# Patient Record
Sex: Male | Born: 1975
Health system: Southern US, Community
[De-identification: ages and names within clinical notes are randomized; demographics above are authoritative.]

## PROBLEM LIST (undated history)

## (undated) DIAGNOSIS — I10 Essential (primary) hypertension: Secondary | ICD-10-CM

## (undated) DIAGNOSIS — E119 Type 2 diabetes mellitus without complications: Secondary | ICD-10-CM

---

## 2001-06-01 ENCOUNTER — Encounter: Payer: Self-pay | Admitting: Emergency Medicine

## 2001-06-01 ENCOUNTER — Emergency Department (HOSPITAL_COMMUNITY): Admission: EM | Admit: 2001-06-01 | Discharge: 2001-06-01 | Payer: Self-pay | Admitting: *Deleted

## 2005-01-31 ENCOUNTER — Emergency Department (HOSPITAL_COMMUNITY): Admission: EM | Admit: 2005-01-31 | Discharge: 2005-01-31 | Payer: Self-pay | Admitting: Emergency Medicine

## 2007-03-18 ENCOUNTER — Ambulatory Visit (HOSPITAL_COMMUNITY): Admission: RE | Admit: 2007-03-18 | Discharge: 2007-03-18 | Payer: Self-pay | Admitting: Family Medicine

## 2007-08-05 ENCOUNTER — Emergency Department (HOSPITAL_COMMUNITY): Admission: EM | Admit: 2007-08-05 | Discharge: 2007-08-05 | Payer: Self-pay | Admitting: Emergency Medicine

## 2007-09-22 ENCOUNTER — Emergency Department (HOSPITAL_COMMUNITY): Admission: EM | Admit: 2007-09-22 | Discharge: 2007-09-22 | Payer: Self-pay | Admitting: Family Medicine

## 2010-01-10 ENCOUNTER — Ambulatory Visit: Payer: Self-pay | Admitting: Radiology

## 2010-01-10 ENCOUNTER — Emergency Department (HOSPITAL_BASED_OUTPATIENT_CLINIC_OR_DEPARTMENT_OTHER): Admission: EM | Admit: 2010-01-10 | Discharge: 2010-01-10 | Payer: Self-pay | Admitting: Emergency Medicine

## 2010-09-28 LAB — CBC
MCHC: 33 g/dL (ref 30.0–36.0)
MCV: 90 fL (ref 78.0–100.0)
Platelets: 279 10*3/uL (ref 150–400)
RDW: 12.6 % (ref 11.5–15.5)
WBC: 8.4 10*3/uL (ref 4.0–10.5)

## 2010-09-28 LAB — BASIC METABOLIC PANEL
BUN: 15 mg/dL (ref 6–23)
Chloride: 104 mEq/L (ref 96–112)
Creatinine, Ser: 1.6 mg/dL — ABNORMAL HIGH (ref 0.4–1.5)
Glucose, Bld: 113 mg/dL — ABNORMAL HIGH (ref 70–99)

## 2010-09-28 LAB — URINALYSIS, ROUTINE W REFLEX MICROSCOPIC
Bilirubin Urine: NEGATIVE
Glucose, UA: NEGATIVE mg/dL
Ketones, ur: NEGATIVE mg/dL
Nitrite: NEGATIVE
Protein, ur: NEGATIVE mg/dL

## 2010-09-28 LAB — DIFFERENTIAL
Basophils Absolute: 0.1 10*3/uL (ref 0.0–0.1)
Basophils Relative: 1 % (ref 0–1)
Eosinophils Absolute: 0.1 10*3/uL (ref 0.0–0.7)
Eosinophils Relative: 1 % (ref 0–5)
Lymphocytes Relative: 40 % (ref 12–46)
Monocytes Absolute: 0.5 10*3/uL (ref 0.1–1.0)

## 2011-09-29 ENCOUNTER — Other Ambulatory Visit (HOSPITAL_COMMUNITY): Payer: Self-pay | Admitting: Internal Medicine

## 2011-09-29 DIAGNOSIS — E298 Other testicular dysfunction: Secondary | ICD-10-CM

## 2011-10-02 ENCOUNTER — Other Ambulatory Visit (HOSPITAL_COMMUNITY): Payer: Self-pay

## 2011-10-05 ENCOUNTER — Ambulatory Visit (HOSPITAL_COMMUNITY)
Admission: RE | Admit: 2011-10-05 | Discharge: 2011-10-05 | Disposition: A | Discharge status: Home / Self Care | Payer: 59 | Source: Ambulatory Visit | Attending: Internal Medicine | Admitting: Internal Medicine

## 2011-10-05 ENCOUNTER — Other Ambulatory Visit (HOSPITAL_COMMUNITY): Payer: Self-pay | Admitting: Internal Medicine

## 2011-10-05 DIAGNOSIS — E298 Other testicular dysfunction: Secondary | ICD-10-CM

## 2011-10-05 DIAGNOSIS — N509 Disorder of male genital organs, unspecified: Secondary | ICD-10-CM | POA: Insufficient documentation

## 2011-10-05 DIAGNOSIS — N433 Hydrocele, unspecified: Secondary | ICD-10-CM | POA: Insufficient documentation

## 2012-05-27 ENCOUNTER — Other Ambulatory Visit: Payer: Self-pay | Admitting: Family Medicine

## 2012-05-27 DIAGNOSIS — R531 Weakness: Secondary | ICD-10-CM

## 2012-05-27 DIAGNOSIS — M25511 Pain in right shoulder: Secondary | ICD-10-CM

## 2012-05-30 ENCOUNTER — Other Ambulatory Visit (HOSPITAL_COMMUNITY): Payer: 59

## 2012-06-02 ENCOUNTER — Ambulatory Visit (HOSPITAL_COMMUNITY)
Admission: RE | Admit: 2012-06-02 | Discharge: 2012-06-02 | Disposition: A | Discharge status: Home / Self Care | Payer: 59 | Source: Ambulatory Visit | Attending: Family Medicine | Admitting: Family Medicine

## 2012-06-02 DIAGNOSIS — M25511 Pain in right shoulder: Secondary | ICD-10-CM

## 2012-06-02 DIAGNOSIS — R5381 Other malaise: Secondary | ICD-10-CM | POA: Insufficient documentation

## 2012-06-02 DIAGNOSIS — M25519 Pain in unspecified shoulder: Secondary | ICD-10-CM | POA: Insufficient documentation

## 2012-06-02 DIAGNOSIS — R531 Weakness: Secondary | ICD-10-CM

## 2012-06-02 DIAGNOSIS — R5383 Other fatigue: Secondary | ICD-10-CM | POA: Insufficient documentation

## 2012-11-03 ENCOUNTER — Other Ambulatory Visit: Payer: Self-pay | Admitting: Orthopedic Surgery

## 2013-03-22 ENCOUNTER — Other Ambulatory Visit: Payer: Self-pay | Admitting: Orthopedic Surgery

## 2013-03-26 ENCOUNTER — Encounter (HOSPITAL_COMMUNITY): Payer: Self-pay | Admitting: *Deleted

## 2013-03-26 ENCOUNTER — Emergency Department (HOSPITAL_COMMUNITY)
Admission: EM | Admit: 2013-03-26 | Discharge: 2013-03-26 | Disposition: A | Discharge status: Home / Self Care | Payer: 59 | Source: Home / Self Care | Attending: Family Medicine | Admitting: Family Medicine

## 2013-03-26 ENCOUNTER — Emergency Department (HOSPITAL_COMMUNITY): Payer: 59

## 2013-03-26 DIAGNOSIS — J069 Acute upper respiratory infection, unspecified: Secondary | ICD-10-CM

## 2013-03-26 LAB — POCT RAPID STREP A: Streptococcus, Group A Screen (Direct): NEGATIVE

## 2013-03-26 MED ORDER — FLUTICASONE PROPIONATE 50 MCG/ACT NA SUSP: 2.0000 | Freq: Every day | NASAL | Status: DC

## 2013-03-26 MED ORDER — PREDNISONE 5 MG PO TABS: ORAL_TABLET | ORAL | Status: DC

## 2013-03-26 MED ORDER — GUAIFENESIN-CODEINE 100-10 MG/5ML PO SOLN: 5.0000 mL | Freq: Four times a day (QID) | ORAL | Status: DC | PRN

## 2013-03-26 NOTE — ED Provider Notes (Signed)
CSN: [id number]     Arrival date & time 03/26/13  0902 History   First MD Initiated Contact with Patient 03/26/13 0919     Chief Complaint  Patient presents with  . URI   (Consider location/radiation/quality/duration/timing/severity/associated sxs/prior Treatment) HPI Comments: 37 year old male presents for evaluation of cough, congestion, sore throat, fever, chills, and right ear pressure. This all began on Wednesday, 5 days ago, he has been getting progressively worse. It started out as congestion and sore throat. The pressure in his right ear has been present for sometime previous to this. Over the first couple of days, he feels like this has moved down into his chest. He feels sick and has mild shortness of breath as well. He denies NVD, abdominal pain, recent travel, sick contacts. There is no pleuritic chest pain. Cough has been productive of clear sputum. He has been taking Benadryl and saline nasal spray without relief of his symptoms.    Patient is a 37 y.o. male presenting with URI.  URI Presenting symptoms: congestion, cough, ear pain (fullness), fever and sore throat   Presenting symptoms: no fatigue and no rhinorrhea   Associated symptoms: no arthralgias, no myalgias and no neck pain     History reviewed. No pertinent past medical history. History reviewed. No pertinent past surgical history. No family history on file. History  Substance Use Topics  . Smoking status: Former Games developer  . Smokeless tobacco: Not on file  . Alcohol Use: Yes     Comment: occasionally    Review of Systems  Constitutional: Positive for fever and chills. Negative for fatigue.  HENT: Positive for ear pain (fullness), congestion and sore throat. Negative for nosebleeds, rhinorrhea, neck pain, postnasal drip and sinus pressure.   Eyes: Negative for visual disturbance.  Respiratory: Positive for cough and shortness of breath.   Cardiovascular: Negative for chest pain, palpitations and leg swelling.   Gastrointestinal: Negative for nausea, vomiting, abdominal pain, diarrhea and constipation.  Genitourinary: Negative for dysuria, urgency, frequency and hematuria.  Musculoskeletal: Negative for myalgias and arthralgias.  Skin: Negative for rash.  Neurological: Negative for dizziness, weakness and light-headedness.    Allergies  Review of patient's allergies indicates no known allergies.  Home Medications   Current Outpatient Rx  Name  Route  Sig  Dispense  Refill  . lisinopril (PRINIVIL,ZESTRIL) 10 MG tablet   Oral   Take 10 mg by mouth daily.         . fluticasone (FLONASE) 50 MCG/ACT nasal spray   Nasal   Place 2 sprays into the nose daily.   16 g   0   . guaiFENesin-codeine 100-10 MG/5ML syrup   Oral   Take 5 mLs by mouth 4 (four) times daily as needed for cough.   180 mL   0   . predniSONE (DELTASONE) 5 MG tablet      5 tabs QD on days 1-4;  3 tabs QD on days 5-6; 2 tabs QD on days 7-8   30 tablet   0    BP 141/79  Pulse 90  Temp(Src) 98.7 F (37.1 C) (Oral)  Resp 18  SpO2 100% Physical Exam  Nursing note and vitals reviewed. Constitutional: He is oriented to person, place, and time. He appears well-developed and well-nourished. No distress.  HENT:  Head: Normocephalic.  Right Ear: Hearing, external ear and ear canal normal. A middle ear effusion (serous) is present.  Left Ear: Hearing, external ear and ear canal normal.  No middle ear effusion.  Mouth/Throat: Posterior oropharyngeal erythema present. No oropharyngeal exudate.  Neck: Normal range of motion.  Cardiovascular: Normal rate, regular rhythm and normal heart sounds.   Pulmonary/Chest: Effort normal. No respiratory distress. He has rales in the left lower field.  Lymphadenopathy:    He has no cervical adenopathy.  Neurological: He is alert and oriented to person, place, and time. Coordination normal.  Skin: Skin is warm and dry. No rash noted. He is not diaphoretic.  Psychiatric: He has a  normal mood and affect. Judgment normal.    ED Course  Procedures (including critical care time) Labs Review Labs Reviewed  POCT RAPID STREP A ([facility] ONLY)   Imaging Review Dg Chest 2 View  03/26/2013   *RADIOLOGY REPORT*  Clinical Data: Cough, left lower lobe rales  CHEST - 2 VIEW  Comparison: None.  Findings: The lungs are well-aerated and free from pulmonary edema, focal airspace consolidation or pulmonary nodule.  Cardiac and mediastinal contours are within normal limits.  No pneumothorax, or pleural effusion. No acute osseous findings.  IMPRESSION:  No acute cardiopulmonary disease.   Original Report Authenticated By: Malachy Moan, M.D.    MDM   1. URI (upper respiratory infection)    No XR evidence of PNA.  Viral URI.  Tx symptomatically.  F/u if not improving    Meds ordered this encounter  Medications  . predniSONE (DELTASONE) 5 MG tablet    Sig: 5 tabs QD on days 1-4;  3 tabs QD on days 5-6; 2 tabs QD on days 7-8    Dispense:  30 tablet    Refill:  0  . guaiFENesin-codeine 100-10 MG/5ML syrup    Sig: Take 5 mLs by mouth 4 (four) times daily as needed for cough.    Dispense:  180 mL    Refill:  0  . fluticasone (FLONASE) 50 MCG/ACT nasal spray    Sig: Place 2 sprays into the nose daily.    Dispense:  16 g    Refill:  0       Graylon Good, PA-C 03/26/13 1016

## 2013-03-26 NOTE — ED Notes (Signed)
Patient complains of head  Congestion x 4 days with cough chest congestion and sore throat; also right ear pressure. Denies diarrhea, nausea/vomiting.

## 2013-03-27 NOTE — ED Provider Notes (Signed)
Medical screening examination/treatment/procedure(s) were performed by a resident physician or non-physician practitioner and as the supervising physician I was immediately available for consultation/collaboration.  Seng Fouts, MD    Thressa Shiffer S Quinton Voth, MD 03/27/13 0800 

## 2013-03-28 LAB — CULTURE, GROUP A STREP

## 2013-04-03 ENCOUNTER — Ambulatory Visit (HOSPITAL_BASED_OUTPATIENT_CLINIC_OR_DEPARTMENT_OTHER): Admission: RE | Admit: 2013-04-03 | Payer: 59 | Source: Ambulatory Visit | Admitting: Orthopedic Surgery

## 2013-04-03 SURGERY — SHOULDER ARTHROSCOPY WITH SUBACROMIAL DECOMPRESSION
Anesthesia: Choice | Site: Shoulder | Laterality: Right

## 2013-04-04 ENCOUNTER — Encounter (HOSPITAL_COMMUNITY): Payer: Self-pay | Admitting: *Deleted

## 2013-04-04 ENCOUNTER — Emergency Department (HOSPITAL_COMMUNITY)
Admission: EM | Admit: 2013-04-04 | Discharge: 2013-04-04 | Disposition: A | Discharge status: Home / Self Care | Payer: 59 | Source: Home / Self Care | Attending: Emergency Medicine | Admitting: Emergency Medicine

## 2013-04-04 ENCOUNTER — Other Ambulatory Visit (HOSPITAL_COMMUNITY)
Admission: RE | Admit: 2013-04-04 | Discharge: 2013-04-04 | Disposition: A | Discharge status: Home / Self Care | Payer: 59 | Source: Ambulatory Visit | Attending: Emergency Medicine | Admitting: Emergency Medicine

## 2013-04-04 DIAGNOSIS — N342 Other urethritis: Secondary | ICD-10-CM

## 2013-04-04 DIAGNOSIS — Z113 Encounter for screening for infections with a predominantly sexual mode of transmission: Secondary | ICD-10-CM | POA: Insufficient documentation

## 2013-04-04 HISTORY — DX: Type 2 diabetes mellitus without complications: E11.9

## 2013-04-04 HISTORY — DX: Essential (primary) hypertension: I10

## 2013-04-04 LAB — POCT URINALYSIS DIP (DEVICE)
Glucose, UA: NEGATIVE mg/dL
Nitrite: NEGATIVE
Protein, ur: NEGATIVE mg/dL
Urobilinogen, UA: 0.2 mg/dL (ref 0.0–1.0)

## 2013-04-04 MED ORDER — CEFTRIAXONE SODIUM 250 MG IJ SOLR
INTRAMUSCULAR | Status: AC
  Filled 2013-04-04: qty 250

## 2013-04-04 MED ORDER — CEFTRIAXONE SODIUM 250 MG IJ SOLR
250.0000 mg | Freq: Once | INTRAMUSCULAR | Status: AC
  Administered 2013-04-04: 250 mg via INTRAMUSCULAR

## 2013-04-04 MED ORDER — AZITHROMYCIN 250 MG PO TABS
1000.0000 mg | ORAL_TABLET | Freq: Once | ORAL | Status: AC
  Administered 2013-04-04: 1000 mg via ORAL

## 2013-04-04 MED ORDER — AZITHROMYCIN 250 MG PO TABS
ORAL_TABLET | ORAL | Status: AC
  Filled 2013-04-04: qty 4

## 2013-04-04 NOTE — ED Provider Notes (Signed)
Chief Complaint:   Chief Complaint  Patient presents with  . Penis Pain    Tingling, Pressure    History of Present Illness:   Adam Wiley is a 37 year old male who has a five-day history of tingling and burning at the tip of the urethra. He feels a sensation of pressure in the urethra. He denies any dysuria, frequency, urgency, hematuria, or urethral discharge. He's had some mild lower abdominal pain. He denies any lesions of the penis such as ulcers, blisters, sores, or bumps. He denies any lymphadenopathy or testicular swelling or tenderness. He's had no fever, chills, skin rash, or joint pain. He is sexually active with one male partner. He typically wears a condom but the condom broke one to 2 days before onset of symptoms and is concerned about the possibility of STDs. The patient states he had complete STD testing 2 weeks ago although it was negative.  Review of Systems:  Other than noted above, the patient denies any of the following symptoms: Systemic:  No fevers chills, aches, weight loss, arthralgias, myalgias, or adenopathy. GI:  No abdominal pain, nausea or vomiting. GU:  No dysuria, penile pain, discharge, itching, dysuria, genital lesions, testicular pain or swelling. Skin:  No rash or itching.  PMFSH:  Past medical history, family history, social history, meds, and allergies were reviewed. He has no allergies. He takes lisinopril plus a diabetes medicine. He has type 2 diabetes and high blood pressure.  Physical Exam:   Vital signs:  BP 152/86  Pulse 80  Temp(Src) 98.2 F (36.8 C) (Oral)  Resp 16  SpO2 100% Gen:  Alert, oriented, in no distress. Abdomen:  Soft and flat, non-distended, and non-tender.  No organomegaly or mass. Genital:  Normal exam. No urethral discharge, penile lesions, inguinal lymphadenopathy, or testicular pain or swelling. Skin:  Warm and dry.  No rash.   Labs:   Results for orders placed during the hospital encounter of 04/04/13  POCT  URINALYSIS DIP (DEVICE)      Result Value Range   Glucose, UA NEGATIVE  NEGATIVE mg/dL   Bilirubin Urine NEGATIVE  NEGATIVE   Ketones, ur NEGATIVE  NEGATIVE mg/dL   Specific Gravity, Urine 1.015  1.005 - 1.030   Hgb urine dipstick NEGATIVE  NEGATIVE   pH 8.5 (*) 5.0 - 8.0   Protein, ur NEGATIVE  NEGATIVE mg/dL   Urobilinogen, UA 0.2  0.0 - 1.0 mg/dL   Nitrite NEGATIVE  NEGATIVE   Leukocytes, UA NEGATIVE  NEGATIVE    DNA probes for gonorrhea, Chlamydia, Trichomonas were obtained.  Course in Urgent Care Center:  Given Rocephin 250 mg IM and azithromycin 1000 mg by mouth.  Assessment:  The encounter diagnosis was Urethritis.  Plan:   1.  Meds:  The following meds were prescribed:   New Prescriptions   No medications on file    2.  Patient Education/Counseling:  The patient was given appropriate handouts, self care instructions, and instructed in symptomatic relief.The patient was instructed to inform all sexual contacts, avoid intercourse completely for 2 weeks and then only with a condom.  The patient was told that we would call about all abnormal lab results, and that we would need to report certain kinds of infection to the health department.   3.  Follow up:  The patient was told to follow up if no better in 3 to 4 days, if becoming worse in any way, and given some red flag symptoms such as difficulty urinating, fever, or  increasing pain which would prompt immediate return.  Follow up here if necessary.     Reuben Likes, MD 04/04/13 1016

## 2013-04-04 NOTE — ED Notes (Signed)
Pt states that he has had tingling and pressure in the head of his penis for the past 5 days.  He states that he has gone to his primary care physician and was tested for STDs.  He states that all test results are negative.  He states that he recently has had intercourse and the condom broke.  He denies any pain during urination or discharge.  *

## 2013-04-05 LAB — URINE CULTURE
Colony Count: NO GROWTH
Culture: NO GROWTH
Special Requests: NORMAL

## 2013-04-07 ENCOUNTER — Encounter (HOSPITAL_COMMUNITY): Payer: Self-pay | Admitting: *Deleted

## 2013-04-08 NOTE — ED Notes (Signed)
Urine culture negative. GC, chlamydia, trichomonas negative

## 2013-04-11 ENCOUNTER — Telehealth (HOSPITAL_COMMUNITY): Payer: Self-pay | Admitting: *Deleted

## 2013-04-11 NOTE — ED Notes (Signed)
Pt. called for his lab results.  Pt. verified x 2 and given neg. results.  Pt. said he is improved but still feels some tingling. Instructed pt. to f/u with Dr. Allyne Gee. Vassie Moselle 04/11/2013

## 2013-05-19 ENCOUNTER — Encounter (HOSPITAL_COMMUNITY): Payer: Self-pay | Admitting: Emergency Medicine

## 2013-05-19 ENCOUNTER — Emergency Department (HOSPITAL_COMMUNITY)
Admission: EM | Admit: 2013-05-19 | Discharge: 2013-05-19 | Disposition: A | Discharge status: Home / Self Care | Payer: 59 | Source: Home / Self Care | Attending: Family Medicine | Admitting: Family Medicine

## 2013-05-19 DIAGNOSIS — J069 Acute upper respiratory infection, unspecified: Secondary | ICD-10-CM

## 2013-05-19 MED ORDER — IPRATROPIUM BROMIDE 0.03 % NA SOLN: 2.0000 | Freq: Two times a day (BID) | NASAL | Status: DC

## 2013-05-19 MED ORDER — AZITHROMYCIN 250 MG PO TABS: 250.0000 mg | ORAL_TABLET | Freq: Every day | ORAL | Status: DC

## 2013-05-19 MED ORDER — GUAIFENESIN-CODEINE 100-10 MG/5ML PO SOLN: 5.0000 mL | Freq: Three times a day (TID) | ORAL | Status: DC | PRN

## 2013-05-19 MED ORDER — BENZONATATE 100 MG PO CAPS: 100.0000 mg | ORAL_CAPSULE | Freq: Three times a day (TID) | ORAL | Status: DC

## 2013-05-19 NOTE — ED Notes (Signed)
Pt c/o cold sxs onset Saturday Sxs include: congestion, cough w/green phlegm, BA Denies: f/v/n/d, SOB, wheezing Alert w/no signs of acute distress.

## 2013-05-19 NOTE — ED Provider Notes (Signed)
Adam Wiley is a 37 y.o. male who presents to Urgent Care today for sore throat cough nasal congestion and ear congestion present for 5 days. Patient denies any trouble breathing chest pains or palpitations. He feels well otherwise. He's tried some over-the-counter Mucinex which is not very effective. He feels well otherwise. He is a Naval architect.    Past Medical History  Diagnosis Date  . Hypertension   . Diabetes mellitus without complication    History  Substance Use Topics  . Smoking status: Former Games developer  . Smokeless tobacco: Not on file  . Alcohol Use: Yes     Comment: occasionally   ROS as above Medications reviewed. No current facility-administered medications for this encounter.   Current Outpatient Prescriptions  Medication Sig Dispense Refill  . lisinopril (PRINIVIL,ZESTRIL) 10 MG tablet Take 10 mg by mouth daily.      Marland Kitchen azithromycin (ZITHROMAX) 250 MG tablet Take 1 tablet (250 mg total) by mouth daily. Take first 2 tablets together, then 1 every day until finished.  6 tablet  0  . benzonatate (TESSALON) 100 MG capsule Take 1 capsule (100 mg total) by mouth every 8 (eight) hours.  21 capsule  0  . guaiFENesin-codeine 100-10 MG/5ML syrup Take 5 mLs by mouth 3 (three) times daily as needed for cough. Do not take and drive  161 mL  0  . ipratropium (ATROVENT) 0.03 % nasal spray Place 2 sprays into the nose every 12 (twelve) hours.  30 mL  1  . [DISCONTINUED] fluticasone (FLONASE) 50 MCG/ACT nasal spray Place 2 sprays into the nose daily.  16 g  0    Exam:  BP 170/94  Pulse 87  Temp(Src) 98.4 F (36.9 C) (Oral)  Resp 18  SpO2 100% Gen: Well NAD HEENT: EOMI,  MMM, posterior pharynx with cobblestoning. Tympanic membranes are normal appearing bilaterally. Lungs: CTABL Nl WOB Heart: RRR no MRG Abd: NABS, NT, ND Exts: Non edematous BL  LE, warm and well perfused.   No results found for this or any previous visit (from the past 24 hour(s)). No results  found.  Assessment and Plan: 37 y.o. male with viral URI with cough. Plan to treat symptomatically with Atrovent nasal spray, codeine containing cough medication, and Tessalon Perles. Additionally use Tylenol or ibuprofen. If not improved will fill and start taking azithromycin. Follow up with primary care provider as needed. Discussed warning signs or symptoms. Please see discharge instructions. Patient expresses understanding.      Rodolph Bong, MD 05/19/13 [phone]

## 2013-06-22 ENCOUNTER — Emergency Department (HOSPITAL_COMMUNITY)
Admission: EM | Admit: 2013-06-22 | Discharge: 2013-06-22 | Disposition: A | Discharge status: Home / Self Care | Payer: 59 | Source: Home / Self Care | Attending: Family Medicine | Admitting: Family Medicine

## 2013-06-22 ENCOUNTER — Other Ambulatory Visit (HOSPITAL_COMMUNITY)
Admission: RE | Admit: 2013-06-22 | Discharge: 2013-06-22 | Disposition: A | Discharge status: Home / Self Care | Payer: 59 | Source: Ambulatory Visit | Attending: Family Medicine | Admitting: Family Medicine

## 2013-06-22 ENCOUNTER — Encounter (HOSPITAL_COMMUNITY): Payer: Self-pay | Admitting: Emergency Medicine

## 2013-06-22 DIAGNOSIS — N342 Other urethritis: Secondary | ICD-10-CM

## 2013-06-22 DIAGNOSIS — Z113 Encounter for screening for infections with a predominantly sexual mode of transmission: Secondary | ICD-10-CM | POA: Insufficient documentation

## 2013-06-22 LAB — POCT URINALYSIS DIP (DEVICE)
Bilirubin Urine: NEGATIVE
Glucose, UA: NEGATIVE mg/dL
Leukocytes, UA: NEGATIVE
Nitrite: NEGATIVE
Urobilinogen, UA: 0.2 mg/dL (ref 0.0–1.0)

## 2013-06-22 MED ORDER — AZITHROMYCIN 250 MG PO TABS
ORAL_TABLET | ORAL | Status: AC
  Filled 2013-06-22: qty 4

## 2013-06-22 MED ORDER — AZITHROMYCIN 250 MG PO TABS
1000.0000 mg | ORAL_TABLET | Freq: Once | ORAL | Status: AC
  Administered 2013-06-22: 1000 mg via ORAL

## 2013-06-22 MED ORDER — LIDOCAINE HCL (PF) 1 % IJ SOLN
INTRAMUSCULAR | Status: AC
  Filled 2013-06-22: qty 5

## 2013-06-22 MED ORDER — CEFTRIAXONE SODIUM 250 MG IJ SOLR
INTRAMUSCULAR | Status: AC
  Filled 2013-06-22: qty 250

## 2013-06-22 MED ORDER — CEFTRIAXONE SODIUM 250 MG IJ SOLR
250.0000 mg | Freq: Once | INTRAMUSCULAR | Status: AC
  Administered 2013-06-22: 250 mg via INTRAMUSCULAR

## 2013-06-22 NOTE — ED Provider Notes (Signed)
Adam Wiley is a 37 y.o. male who presents to Urgent Care today for STD exposure. Patient had sex with a woman in 3 days ago. The condom broke and he did not realize it until after the sex was over. Since then he's had a slight itchy sensation when he urinates.  He denies any discharge or abdominal pain. He has never had significant infection. He last had HIV and syphilis testing about 3 months ago. He feels well otherwise and has not had any other STD exposures until today.    Past Medical History  Diagnosis Date  . Hypertension   . Diabetes mellitus without complication    History  Substance Use Topics  . Smoking status: Former Games developer  . Smokeless tobacco: Not on file  . Alcohol Use: Yes     Comment: occasionally   ROS as above Medications reviewed. Current Facility-Administered Medications  Medication Dose Route Frequency Provider Last Rate Last Dose  . azithromycin (ZITHROMAX) tablet 1,000 mg  1,000 mg Oral Once Rodolph Bong, MD      . cefTRIAXone (ROCEPHIN) injection 250 mg  250 mg Intramuscular Once Rodolph Bong, MD       Current Outpatient Prescriptions  Medication Sig Dispense Refill  . lisinopril (PRINIVIL,ZESTRIL) 10 MG tablet Take 10 mg by mouth daily.      . [DISCONTINUED] fluticasone (FLONASE) 50 MCG/ACT nasal spray Place 2 sprays into the nose daily.  16 g  0    Exam:  BP 155/90  Pulse 81  Temp(Src) 97.8 F (36.6 C) (Oral)  Resp 18  SpO2 100% Gen: Well NAD GENITALS: Normal appearing circumcised penis without discharge or tenderness. Left testicle is normal to palpation and descended. Right testicle is higher and smaller but also nontender.    Results for orders placed during the hospital encounter of 06/22/13 (from the past 24 hour(s))  POCT URINALYSIS DIP (DEVICE)     Status: Abnormal   Collection Time    06/22/13  8:25 AM      Result Value Range   Glucose, UA NEGATIVE  NEGATIVE mg/dL   Bilirubin Urine NEGATIVE  NEGATIVE   Ketones, ur NEGATIVE   NEGATIVE mg/dL   Specific Gravity, Urine 1.020  1.005 - 1.030   Hgb urine dipstick TRACE (*) NEGATIVE   pH 7.0  5.0 - 8.0   Protein, ur NEGATIVE  NEGATIVE mg/dL   Urobilinogen, UA 0.2  0.0 - 1.0 mg/dL   Nitrite NEGATIVE  NEGATIVE   Leukocytes, UA NEGATIVE  NEGATIVE   No results found.  Assessment and Plan: 37 y.o. male with urethritis possibly. Urine cytology for gonorrhea, Chlamydia, trichomonas, is pending. Plan for empiric teatment with ceftriaxone and azithromycin as patient is mildly symptomatic and very desirous of treatment.  Will call patient with results.  Recommend followup HIV and RPR testing in about 3 months.  Discussed warning signs or symptoms. Please see discharge instructions. Patient expresses understanding.      Rodolph Bong, MD 06/22/13 [phone]

## 2013-06-22 NOTE — ED Notes (Signed)
Pt wants to be checked for STD Reports he has an itch inside his urethra after condom broke Voices no other concerns... Alert w/no signs of acute distress.

## 2013-06-22 NOTE — ED Notes (Signed)
Pt states he has an appt w/dentist at 0930 and did not want to wait the 30 minutes after the Rocephin inj.... Notified Dr. Denyse Amass.  Also, verified call back number.

## 2013-09-21 ENCOUNTER — Encounter (HOSPITAL_COMMUNITY): Payer: Self-pay | Admitting: Emergency Medicine

## 2013-09-21 ENCOUNTER — Emergency Department (HOSPITAL_COMMUNITY)
Admission: EM | Admit: 2013-09-21 | Discharge: 2013-09-21 | Disposition: A | Discharge status: Home / Self Care | Payer: 59 | Source: Home / Self Care | Attending: Family Medicine | Admitting: Family Medicine

## 2013-09-21 ENCOUNTER — Other Ambulatory Visit (HOSPITAL_COMMUNITY)
Admission: RE | Admit: 2013-09-21 | Discharge: 2013-09-21 | Disposition: A | Discharge status: Home / Self Care | Payer: 59 | Source: Ambulatory Visit | Attending: Family Medicine | Admitting: Family Medicine

## 2013-09-21 DIAGNOSIS — N342 Other urethritis: Secondary | ICD-10-CM

## 2013-09-21 DIAGNOSIS — R202 Paresthesia of skin: Secondary | ICD-10-CM

## 2013-09-21 DIAGNOSIS — Z113 Encounter for screening for infections with a predominantly sexual mode of transmission: Secondary | ICD-10-CM | POA: Insufficient documentation

## 2013-09-21 LAB — URINALYSIS, ROUTINE W REFLEX MICROSCOPIC
Bilirubin Urine: NEGATIVE
Glucose, UA: NEGATIVE mg/dL
HGB URINE DIPSTICK: NEGATIVE
Ketones, ur: NEGATIVE mg/dL
LEUKOCYTES UA: NEGATIVE
NITRITE: NEGATIVE
Protein, ur: NEGATIVE mg/dL
SPECIFIC GRAVITY, URINE: 1.019 (ref 1.005–1.030)
UROBILINOGEN UA: 0.2 mg/dL (ref 0.0–1.0)
pH: 6.5 (ref 5.0–8.0)

## 2013-09-21 LAB — POCT URINALYSIS DIP (DEVICE)
BILIRUBIN URINE: NEGATIVE
GLUCOSE, UA: NEGATIVE mg/dL
Ketones, ur: NEGATIVE mg/dL
Leukocytes, UA: NEGATIVE
NITRITE: NEGATIVE
Protein, ur: NEGATIVE mg/dL
Specific Gravity, Urine: 1.02 (ref 1.005–1.030)
Urobilinogen, UA: 0.2 mg/dL (ref 0.0–1.0)
pH: 7 (ref 5.0–8.0)

## 2013-09-21 MED ORDER — AZITHROMYCIN 250 MG PO TABS
ORAL_TABLET | ORAL | Status: AC
  Filled 2013-09-21: qty 4

## 2013-09-21 MED ORDER — LIDOCAINE HCL (PF) 1 % IJ SOLN
INTRAMUSCULAR | Status: AC
  Filled 2013-09-21: qty 5

## 2013-09-21 MED ORDER — CEFTRIAXONE SODIUM 250 MG IJ SOLR
250.0000 mg | Freq: Once | INTRAMUSCULAR | Status: AC
  Administered 2013-09-21: 250 mg via INTRAMUSCULAR

## 2013-09-21 MED ORDER — CEFTRIAXONE SODIUM 250 MG IJ SOLR
INTRAMUSCULAR | Status: AC
  Filled 2013-09-21: qty 250

## 2013-09-21 MED ORDER — AZITHROMYCIN 250 MG PO TABS
1000.0000 mg | ORAL_TABLET | Freq: Once | ORAL | Status: AC
  Administered 2013-09-21: 1000 mg via ORAL

## 2013-09-21 NOTE — ED Provider Notes (Signed)
Neymar Dowe is a 38 y.o. male who presents to Urgent Care today for STD check. Patient had sex 3 days ago and had a broken condom. He notes a tingling sensation in his glands. He denies any discharge. He is worried about the possibility of sexually-transmitted infection. He feels well otherwise.   Past Medical History  Diagnosis Date  . Hypertension   . Diabetes mellitus without complication    History  Substance Use Topics  . Smoking status: Former Research scientist (life sciences)  . Smokeless tobacco: Not on file  . Alcohol Use: Yes     Comment: occasionally   ROS as above Medications: Current Facility-Administered Medications  Medication Dose Route Frequency Provider Last Rate Last Dose  . azithromycin (ZITHROMAX) tablet 1,000 mg  1,000 mg Oral Once Gregor Hams, MD      . cefTRIAXone (ROCEPHIN) injection 250 mg  250 mg Intramuscular Once Gregor Hams, MD       Current Outpatient Prescriptions  Medication Sig Dispense Refill  . lisinopril (PRINIVIL,ZESTRIL) 10 MG tablet Take 10 mg by mouth daily.      . [DISCONTINUED] fluticasone (FLONASE) 50 MCG/ACT nasal spray Place 2 sprays into the nose daily.  16 g  0    Exam:  BP 123/75  Pulse 82  Temp(Src) 98.5 F (36.9 C) (Oral)  Resp 18  SpO2 100% Gen: Well NAD fit appearing man Genital: No inguinal lymphadenopathy. Testicles are descended bilateral in the scrotum and nontender Penis is circumcised and normal appearing without lesions or discharge  Results for orders placed during the hospital encounter of 09/21/13 (from the past 24 hour(s))  POCT URINALYSIS DIP (DEVICE)     Status: Abnormal   Collection Time    09/21/13 10:25 AM      Result Value Ref Range   Glucose, UA NEGATIVE  NEGATIVE mg/dL   Bilirubin Urine NEGATIVE  NEGATIVE   Ketones, ur NEGATIVE  NEGATIVE mg/dL   Specific Gravity, Urine 1.020  1.005 - 1.030   Hgb urine dipstick TRACE (*) NEGATIVE   pH 7.0  5.0 - 8.0   Protein, ur NEGATIVE  NEGATIVE mg/dL   Urobilinogen, UA 0.2  0.0 -  1.0 mg/dL   Nitrite NEGATIVE  NEGATIVE   Leukocytes, UA NEGATIVE  NEGATIVE   No results found.  Assessment and Plan: 38 y.o. male with STD exposure. Patient requests treatment empirically. Will do so with azithromycin and ceftriaxone. Urine cytology pending. Patient has trace urine hemoglobin on dipstick. This was also present 3 months ago. We'll send urine for microscopy to evaluate for blood versus myoglobin. Patient exercises frequently. Recommend HIV and syphilis testing in the near future.  Discussed warning signs or symptoms. Please see discharge instructions. Patient expresses understanding.    Gregor Hams, MD 09/21/13 1050

## 2013-09-21 NOTE — ED Notes (Signed)
Patient states his condom broke while have intercourse; States some discomfort while voiding; states this started 3 days ago.

## 2013-09-21 NOTE — Discharge Instructions (Signed)
Thank you for coming in today. I will call with results.  I recommend HIV and syphilis testing in about 2-3 months.  I will call about the blood in the urine test.

## 2013-09-22 LAB — URINE CYTOLOGY ANCILLARY ONLY
CHLAMYDIA, DNA PROBE: NEGATIVE
Neisseria Gonorrhea: NEGATIVE
Trichomonas: NEGATIVE

## 2013-11-22 ENCOUNTER — Encounter (HOSPITAL_COMMUNITY): Payer: Self-pay | Admitting: Emergency Medicine

## 2013-11-22 ENCOUNTER — Emergency Department (HOSPITAL_COMMUNITY)
Admission: EM | Admit: 2013-11-22 | Discharge: 2013-11-22 | Disposition: A | Discharge status: Home / Self Care | Payer: 59 | Source: Home / Self Care | Attending: Emergency Medicine | Admitting: Emergency Medicine

## 2013-11-22 DIAGNOSIS — M549 Dorsalgia, unspecified: Secondary | ICD-10-CM

## 2013-11-22 LAB — POCT URINALYSIS DIP (DEVICE)
BILIRUBIN URINE: NEGATIVE
GLUCOSE, UA: NEGATIVE mg/dL
HGB URINE DIPSTICK: NEGATIVE
Ketones, ur: NEGATIVE mg/dL
LEUKOCYTES UA: NEGATIVE
NITRITE: NEGATIVE
Protein, ur: NEGATIVE mg/dL
Specific Gravity, Urine: 1.025 (ref 1.005–1.030)
Urobilinogen, UA: 0.2 mg/dL (ref 0.0–1.0)
pH: 5.5 (ref 5.0–8.0)

## 2013-11-22 MED ORDER — CYCLOBENZAPRINE HCL 10 MG PO TABS: 10.0000 mg | ORAL_TABLET | Freq: Two times a day (BID) | ORAL | Status: DC | PRN

## 2013-11-22 MED ORDER — HYDROCODONE-ACETAMINOPHEN 5-325 MG PO TABS: 2.0000 | ORAL_TABLET | ORAL | Status: DC | PRN

## 2013-11-22 NOTE — ED Notes (Signed)
Lower back pain, onset 7 days

## 2013-11-22 NOTE — ED Provider Notes (Signed)
CSN: [id number]     Arrival date & time 11/22/13  0998 History   First MD Initiated Contact with Patient 11/22/13 1025     Chief Complaint  Patient presents with  . Back Pain   (Consider location/radiation/quality/duration/timing/severity/associated sxs/prior Treatment) Patient is a 38 y.o. male presenting with back pain. The history is provided by the patient. No language interpreter was used.  Back Pain Location:  Lumbar spine Quality:  Aching Radiates to:  Does not radiate Pain severity:  Moderate Pain is:  Worse during the day Onset quality:  Sudden Timing:  Constant Progression:  Worsening Chronicity:  New Relieved by:  Nothing Worsened by:  Nothing tried Ineffective treatments:  None tried Associated symptoms: leg pain   Associated symptoms: no numbness and no paresthesias     Past Medical History  Diagnosis Date  . Hypertension   . Diabetes mellitus without complication    History reviewed. No pertinent past surgical history. No family history on file. History  Substance Use Topics  . Smoking status: Former Research scientist (life sciences)  . Smokeless tobacco: Not on file  . Alcohol Use: Yes     Comment: occasionally    Review of Systems  Musculoskeletal: Positive for back pain.  Neurological: Negative for numbness and paresthesias.  All other systems reviewed and are negative.   Allergies  Review of patient's allergies indicates no known allergies.  Home Medications   Prior to Admission medications   Medication Sig Start Date End Date Taking? Authorizing Provider  lisinopril (PRINIVIL,ZESTRIL) 10 MG tablet Take 10 mg by mouth daily.    Historical Provider, MD   BP 145/75  Pulse 72  Temp(Src) 98.6 F (37 C) (Oral)  Resp 18  SpO2 100% Physical Exam  Constitutional: He appears well-developed and well-nourished.  HENT:  Head: Normocephalic.  Eyes: Conjunctivae are normal. Pupils are equal, round, and reactive to light.  Neck: Normal range of motion. Neck supple.   Cardiovascular: Normal rate and normal heart sounds.   Pulmonary/Chest: Effort normal and breath sounds normal.  Abdominal: Soft.  Musculoskeletal: Normal range of motion.  Neurological: He is alert.    ED Course  Procedures (including critical care time) Labs Review Labs Reviewed  POCT URINALYSIS DIP (DEVICE)    Imaging Review No results found.  Urine negative MDM   1. Back pain   hydrocodone Flexeril [place] referral     Fransico Meadow, PA-C 11/22/13 1100

## 2013-11-22 NOTE — ED Notes (Signed)
Back pain, onset 7 days ago

## 2013-11-22 NOTE — Discharge Instructions (Signed)
Back Pain, Adult Low back pain is very common. About 1 in 5 people have back pain.The cause of low back pain is rarely dangerous. The pain often gets better over time.About half of people with a sudden onset of back pain feel better in just 2 weeks. About 8 in 10 people feel better by 6 weeks.  CAUSES Some common causes of back pain include:  Strain of the muscles or ligaments supporting the spine.  Wear and tear (degeneration) of the spinal discs.  Arthritis.  Direct injury to the back. DIAGNOSIS Most of the time, the direct cause of low back pain is not known.However, back pain can be treated effectively even when the exact cause of the pain is unknown.Answering your caregiver's questions about your overall health and symptoms is one of the most accurate ways to make sure the cause of your pain is not dangerous. If your caregiver needs more information, he or she may order lab work or imaging tests (X-rays or MRIs).However, even if imaging tests show changes in your back, this usually does not require surgery. HOME CARE INSTRUCTIONS For many people, back pain returns.Since low back pain is rarely dangerous, it is often a condition that people can learn to manageon their own.   Remain active. It is stressful on the back to sit or stand in one place. Do not sit, drive, or stand in one place for more than 30 minutes at a time. Take short walks on level surfaces as soon as pain allows.Try to increase the length of time you walk each day.  Do not stay in bed.Resting more than 1 or 2 days can delay your recovery.  Do not avoid exercise or work.Your body is made to move.It is not dangerous to be active, even though your back may hurt.Your back will likely heal faster if you return to being active before your pain is gone.  Pay attention to your body when you bend and lift. Many people have less discomfortwhen lifting if they bend their knees, keep the load close to their bodies,and  avoid twisting. Often, the most comfortable positions are those that put less stress on your recovering back.  Find a comfortable position to sleep. Use a firm mattress and lie on your side with your knees slightly bent. If you lie on your back, put a pillow under your knees.  Only take over-the-counter or prescription medicines as directed by your caregiver. Over-the-counter medicines to reduce pain and inflammation are often the most helpful.Your caregiver may prescribe muscle relaxant drugs.These medicines help dull your pain so you can more quickly return to your normal activities and healthy exercise.  Put ice on the injured area.  Put ice in a plastic bag.  Place a towel between your skin and the bag.  Leave the ice on for 15-20 minutes, 03-04 times a day for the first 2 to 3 days. After that, ice and heat may be alternated to reduce pain and spasms.  Ask your caregiver about trying back exercises and gentle massage. This may be of some benefit.  Avoid feeling anxious or stressed.Stress increases muscle tension and can worsen back pain.It is important to recognize when you are anxious or stressed and learn ways to manage it.Exercise is a great option. SEEK MEDICAL CARE IF:  You have pain that is not relieved with rest or medicine.  You have pain that does not improve in 1 week.  You have new symptoms.  You are generally not feeling well. SEEK   IMMEDIATE MEDICAL CARE IF:   You have pain that radiates from your back into your legs.  You develop new bowel or bladder control problems.  You have unusual weakness or numbness in your arms or legs.  You develop nausea or vomiting.  You develop abdominal pain.  You feel faint. Document Released: 06/29/2005 Document Revised: 12/29/2011 Document Reviewed: 11/17/2010 [employer] Patient Information 2014 [employer].  

## 2013-11-25 NOTE — ED Provider Notes (Signed)
Medical screening examination/treatment/procedure(s) were performed by non-physician practitioner and as supervising physician I was immediately available for consultation/collaboration.  Yanni Quiroa, M.D.  Charlene Cowdrey C Davaun Quintela, MD 11/25/13 0823 

## 2014-04-18 ENCOUNTER — Ambulatory Visit (HOSPITAL_COMMUNITY)
Admit: 2014-04-18 | Discharge: 2014-04-18 | Disposition: A | Discharge status: Home / Self Care | Payer: 59 | Source: Ambulatory Visit | Attending: Family Medicine | Admitting: Family Medicine

## 2014-04-18 ENCOUNTER — Emergency Department (HOSPITAL_COMMUNITY)
Admission: EM | Admit: 2014-04-18 | Discharge: 2014-04-18 | Disposition: A | Discharge status: Home / Self Care | Payer: 59 | Source: Home / Self Care | Attending: Family Medicine | Admitting: Family Medicine

## 2014-04-18 ENCOUNTER — Other Ambulatory Visit (HOSPITAL_COMMUNITY)
Admission: RE | Admit: 2014-04-18 | Discharge: 2014-04-18 | Disposition: A | Discharge status: Home / Self Care | Payer: 59 | Source: Ambulatory Visit | Attending: Family Medicine | Admitting: Family Medicine

## 2014-04-18 ENCOUNTER — Encounter (HOSPITAL_COMMUNITY): Payer: Self-pay | Admitting: Emergency Medicine

## 2014-04-18 DIAGNOSIS — W19XXXA Unspecified fall, initial encounter: Secondary | ICD-10-CM

## 2014-04-18 DIAGNOSIS — Z202 Contact with and (suspected) exposure to infections with a predominantly sexual mode of transmission: Secondary | ICD-10-CM

## 2014-04-18 DIAGNOSIS — Z113 Encounter for screening for infections with a predominantly sexual mode of transmission: Secondary | ICD-10-CM | POA: Diagnosis present

## 2014-04-18 DIAGNOSIS — S43004A Unspecified dislocation of right shoulder joint, initial encounter: Secondary | ICD-10-CM

## 2014-04-18 DIAGNOSIS — M25511 Pain in right shoulder: Secondary | ICD-10-CM | POA: Diagnosis present

## 2014-04-18 DIAGNOSIS — Y92009 Unspecified place in unspecified non-institutional (private) residence as the place of occurrence of the external cause: Secondary | ICD-10-CM

## 2014-04-18 LAB — RPR

## 2014-04-18 LAB — HIV ANTIBODY (ROUTINE TESTING W REFLEX): HIV: NONREACTIVE

## 2014-04-18 MED ORDER — AZITHROMYCIN 250 MG PO TABS
1000.0000 mg | ORAL_TABLET | Freq: Once | ORAL | Status: AC
  Administered 2014-04-18: 1000 mg via ORAL

## 2014-04-18 MED ORDER — IBUPROFEN 800 MG PO TABS
ORAL_TABLET | ORAL | Status: AC
  Filled 2014-04-18: qty 1

## 2014-04-18 MED ORDER — LIDOCAINE HCL (PF) 1 % IJ SOLN
INTRAMUSCULAR | Status: AC
  Filled 2014-04-18: qty 5

## 2014-04-18 MED ORDER — CYCLOBENZAPRINE HCL 10 MG PO TABS: 10.0000 mg | ORAL_TABLET | Freq: Every evening | ORAL | Status: DC | PRN

## 2014-04-18 MED ORDER — CEFTRIAXONE SODIUM 250 MG IJ SOLR
250.0000 mg | Freq: Once | INTRAMUSCULAR | Status: AC
  Administered 2014-04-18: 250 mg via INTRAMUSCULAR

## 2014-04-18 MED ORDER — CEFTRIAXONE SODIUM 250 MG IJ SOLR
INTRAMUSCULAR | Status: AC
  Filled 2014-04-18: qty 250

## 2014-04-18 MED ORDER — AZITHROMYCIN 250 MG PO TABS
ORAL_TABLET | ORAL | Status: AC
  Filled 2014-04-18: qty 4

## 2014-04-18 MED ORDER — IBUPROFEN 800 MG PO TABS
800.0000 mg | ORAL_TABLET | Freq: Once | ORAL | Status: AC
  Administered 2014-04-18: 800 mg via ORAL

## 2014-04-18 MED ORDER — TRAMADOL HCL 50 MG PO TABS: 50.0000 mg | ORAL_TABLET | Freq: Four times a day (QID) | ORAL | Status: DC | PRN

## 2014-04-18 MED ORDER — DICLOFENAC SODIUM 50 MG PO TBEC: 50.0000 mg | DELAYED_RELEASE_TABLET | Freq: Two times a day (BID) | ORAL | Status: DC | PRN

## 2014-04-18 NOTE — ED Notes (Signed)
Pt c/o left shoulder inj onset last night Reports he fell off a shed; 8 ft tall; landed on left shoulder Hurts to move arm Also concerned about STD; sx include dysuria Alert, no signs of acute distress.

## 2014-04-18 NOTE — ED Provider Notes (Signed)
Adam Wiley is a 38 y.o. male who presents to Urgent Care today for right shoulder pain. Patient fell off a bar and at home yesterday landing on his right shoulder. He estimates that he fell approximately 8 feet. He notes immediate anterior shoulder pain. The pain is worse with shoulder motion. He denies any radiating pain weakness or numbness. He has not tried any medications yet. He denies any head injury or neck injury.  Additionally patient notes that he is having sex the other day when the condom broke. He would like to be tested and treated for STDs. He does not have any urinary symptoms.   Past Medical History  Diagnosis Date  . Hypertension   . Diabetes mellitus without complication    History  Substance Use Topics  . Smoking status: Former Research scientist (life sciences)  . Smokeless tobacco: Not on file  . Alcohol Use: Yes     Comment: occasionally   ROS as above Medications: Current Facility-Administered Medications  Medication Dose Route Frequency Provider Last Rate Last Dose  . azithromycin (ZITHROMAX) tablet 1,000 mg  1,000 mg Oral Once Gregor Hams, MD      . cefTRIAXone (ROCEPHIN) injection 250 mg  250 mg Intramuscular Once Gregor Hams, MD       Current Outpatient Prescriptions  Medication Sig Dispense Refill  . cyclobenzaprine (FLEXERIL) 10 MG tablet Take 1 tablet (10 mg total) by mouth at bedtime as needed for muscle spasms.  20 tablet  0  . diclofenac (VOLTAREN) 50 MG EC tablet Take 1 tablet (50 mg total) by mouth 2 (two) times daily as needed.  30 tablet  0  . lisinopril (PRINIVIL,ZESTRIL) 10 MG tablet Take 10 mg by mouth daily.      . traMADol (ULTRAM) 50 MG tablet Take 1 tablet (50 mg total) by mouth every 6 (six) hours as needed.  15 tablet  0  . [DISCONTINUED] fluticasone (FLONASE) 50 MCG/ACT nasal spray Place 2 sprays into the nose daily.  16 g  0    Exam:  BP 132/82  Pulse 94  Temp(Src) 98.5 F (36.9 C) (Oral)  Resp 12  SpO2 100% Gen: Well NAD HEENT: EOMI,  MMM Lungs:  Normal work of breathing. CTABL Heart: RRR no MRG Abd: NABS, Soft. Nondistended, Nontender Exts: Brisk capillary refill, warm and well perfused.  Neck: Nontender to spinal midline normal neck range of motion negative Spurling's test. Right shoulder: Depressed right shoulder girdle. Tender to palpation on the anterior aspect of the shoulder and at the a.c. joint. Decreased range of motion. Pulses capillary refill sensation intact distally. Elbow and wrist motion are intact.  No results found for this or any previous visit (from the past 24 hour(s)). Dg Shoulder Right  04/18/2014   CLINICAL DATA:  Status post fall now with right shoulder pain; initial visit  EXAM: RIGHT SHOULDER - 2+ VIEW  COMPARISON:  MRI of the shoulder of June 02, 2012.  FINDINGS: The bones of the shoulder are adequately mineralized. There is no acute fracture nor dislocation. There is no significant bony degenerative change. The soft tissues are unremarkable.  IMPRESSION: There is no acute bony abnormality of the right shoulder.   Electronically Signed   By: David  Martinique   On: 04/18/2014 09:55    Assessment and Plan: 38 y.o. male with  1) right shoulder pain status post fall at home. Likely first degree a.c. shoulder separation. Rotator cuff injury is also a possibility. Plan for sling and said to followup with  sports medicine. 2) STD exposure: Urine cytology for gonorrhea Chlamydia and Trichomonas and blood test for HIV and syphilis are pending. Patient requests empiric treatment with ceftriaxone and azithromycin. We'll call with results.  Discussed warning signs or symptoms. Please see discharge instructions. Patient expresses understanding.     Gregor Hams, MD 04/18/14 1025

## 2014-04-18 NOTE — Discharge Instructions (Signed)
Thank you for coming in today. Take diclofenac twice daily for pain as needed. This will not cause drowsiness or sleepiness and his safety use while driving. Take tramadol and Flexeril as needed for severe pain. Both of these medicines will cause drowsiness and sleepiness and should not be used with driving. Use the sling as needed.  Followup with Dr. Rip Harbour at [facility] for your shoulder.  I will call if any labs come back positive.  Acromioclavicular Separation with Rehab The acromioclavicular joint is the joint between the roof of the shoulder (acromion) and the collarbone (clavicle). It is vulnerable to injury. An acromioclavicular [facility]) separation is a partial or complete tear (sprain), injury, or redness and soreness (inflammation) of the ligaments that cross the acromioclavicular joint and hold it in place. There are two ligaments in this area that are vulnerable to injury, the acromioclavicular ligament and the coracoclavicular ligament. SYMPTOMS   Tenderness and swelling, or a bump on top of the shoulder (at the [facility] joint).  Bruising (contusion) in the area within 48 hours of injury.  Loss of strength or pain when reaching over the head or across the body. CAUSES  AC separation is caused by direct trauma to the joint (falling on your shoulder) or indirect trauma (falling on an outstretched arm). RISK INCREASES WITH:  Sports that require contact or collision, throwing sports (i.e. racquetball, squash).  Poor strength and flexibility.  Previous shoulder sprain or dislocation.  Poorly fitted or padded protective equipment. PREVENTION   Warm-up and stretch properly before activity.  Maintain physical fitness:  Shoulder strength.  Shoulder flexibility.  Cardiovascular fitness.  Wear properly fitted and padded protective equipment.  Learn and use proper technique when playing sports. Have a coach correct improper technique, including falling and  landing.  Apply taping, protective strapping or padding, or an adhesive bandage as recommended before practice or competition. PROGNOSIS   If treated properly, the symptoms of AC separation can be expected to go away.  If treated improperly, permanent disability may occur unless surgery is performed.  Healing time varies with type of sport and position, arm injured (dominant versus non-dominant) and severity of sprain. RELATED COMPLICATIONS  Weakness and fatigue of the arm or shoulder are possible but uncommon.  Pain and inflammation of the [facility] joint may continue.  Prolonged healing time may be necessary if usual activities are resumed too early. This causes a susceptibility to recurrent injury.  Prolonged disability may occur.  The shoulder may remain unstable or arthritic following repeated injury. TREATMENT  Treatment initially involves ice and medication to help reduce pain and inflammation. It may also be necessary to modify your activities in order to prevent further injury. Both non-surgical and surgical interventions exist to treat AC separation. Non-surgical intervention is usually recommended and involves wearing a sling to immobilize the joint for a period of time to allow for healing. Surgical intervention is usually only considered for severe sprains of the ligament or for individuals who do not improve after 2 to 6 months of non-surgical treatment. Surgical interventions require 4 to 6 months before a return to sports is possible. MEDICATION  If pain medication is necessary, nonsteroidal anti-inflammatory medications, such as aspirin and ibuprofen, or other minor pain relievers, such as acetaminophen, are often recommended.  Do not take pain medication for 7 days before surgery.  Prescription pain relievers may be given by your caregiver. Use only as directed and only as much as you need.  Ointments applied to the skin may  be helpful.  Corticosteroid injections may be  given to reduce inflammation. HEAT AND COLD  Cold treatment (icing) relieves pain and reduces inflammation. Cold treatment should be applied for 10 to 15 minutes every 2 to 3 hours for inflammation and pain and immediately after any activity that aggravates your symptoms. Use ice packs or an ice massage.  Heat treatment may be used prior to performing the stretching and strengthening activities prescribed by your caregiver, physical therapist or athletic trainer. Use a heat pack or a warm soak. SEEK IMMEDIATE MEDICAL CARE IF:   Pain, swelling or bruising worsens despite treatment.  There is pain, numbness or coldness in the arm.  Discoloration appears in the fingernails.  New, unexplained symptoms develop. EXERCISES  RANGE OF MOTION (ROM) AND STRETCHING EXERCISES - Acromioclavicular Separation These exercises may help you when beginning to rehabilitate your injury. Your symptoms may resolve with or without further involvement from your physician, physical therapist or athletic trainer. While completing these exercises, remember:  Restoring tissue flexibility helps normal motion to return to the joints. This allows healthier, less painful movement and activity.  An effective stretch should be held for at least 30 seconds.  A stretch should never be painful. You should only feel a gentle lengthening or release in the stretched tissue. ROM - Pendulum  Bend at the waist so that your right / left arm falls away from your body. Support yourself with your opposite hand on a solid surface, such as a table or a countertop.  Your right / left arm should be perpendicular to the ground. If it is not perpendicular, you need to lean over farther. Relax the muscles in your right / left arm and shoulder as much as possible.  Gently sway your hips and trunk so they move your right / left arm without any use of your right / left shoulder muscles.  Progress your movements so that your right / left arm  moves side to side, then forward and backward, and finally, both clockwise and counterclockwise.  Complete __________ repetitions in each direction. Many people use this exercise to relieve discomfort in their shoulder as well as to gain range of motion. Repeat __________ times. Complete this exercise __________ times per day. STRETCH - Flexion, Seated   Sit in a firm chair so that your right / left forearm can rest on a table or countertop. Your right / left elbow should rest below the height of your shoulder so that your shoulder feels supported and not tense or uncomfortable.  Keeping your right / left shoulder relaxed, lean forward at your waist, allowing your right / left hand to slide forward. Bend forward until you feel a moderate stretch in your shoulder, but before you feel an increase in your pain.  Hold __________ seconds. Slowly return to your starting position. Repeat __________ times. Complete this exercise __________ times per day. STRETCH - Flexion, Standing  Stand with good posture. With an underhand grip on your right / left and an overhand grip on the opposite hand, grasp a broomstick or cane so that your hands are a little more than shoulder-width apart.  Keeping your right / left elbow straight and shoulder muscles relaxed, push the stick with your opposite hand to raise your right / left arm in front of your body and then overhead. Raise your arm until you feel a stretch in your right / left shoulder, but before you have increased shoulder pain.  Try to avoid shrugging your right /  left shoulder as your arm rises by keeping your shoulder blade tucked down and toward your mid-back spine. Hold __________ seconds.  Slowly return to the starting position. Repeat __________ times. Complete this exercise __________ times per day. STRENGTHENING EXERCISES - Acromioclavicular Separation These exercises may help you when beginning to rehabilitate your injury. They may resolve your  symptoms with or without further involvement from your physician, physical therapist or athletic trainer. While completing these exercises, remember:  Muscles can gain both the endurance and the strength needed for everyday activities through controlled exercises.  Complete these exercises as instructed by your physician, physical therapist or athletic trainer. Progress the resistance and repetitions only as guided.  You may experience muscle soreness or fatigue, but the pain or discomfort you are trying to eliminate should never worsen during these exercises. If this pain does worsen, stop and make certain you are following the directions exactly. If the pain is still present after adjustments, discontinue the exercise until you can discuss the trouble with your clinician. STRENGTH - Shoulder Abductors, Isometric   With good posture, stand or sit about 4-6 inches from a wall with your right / left side facing the wall.  Bend your right / left elbow. Gently press your right / left elbow into the wall. Increase the pressure gradually until you are pressing as hard as you can without shrugging your shoulder or increasing any shoulder discomfort.  Hold __________ seconds.  Release the tension slowly. Relax your shoulder muscles completely before you start the next repetition. Repeat __________ times. Complete this exercise __________ times per day. STRENGTH - Internal Rotators, Isometric  Keep your right / left elbow at your side and bend it 90 degrees.  Step into a door frame so that the inside of your right / left wrist can press against the door frame without your upper arm leaving your side.  Gently press your right / left wrist into the door frame as if you were trying to draw the palm of your hand to your abdomen. Gradually increase the tension until you are pressing as hard as you can without shrugging your shoulder or increasing any shoulder discomfort.  Hold __________  seconds.  Release the tension slowly. Relax your shoulder muscles completely before you the next repetition. Repeat __________ times. Complete this exercise __________ times per day.  STRENGTH - External Rotators, Isometric  Keep your right / left elbow at your side and bend it 90 degrees.  Step into a door frame so that the outside of your right / left wrist can press against the door frame without your upper arm leaving your side.  Gently press your right / left wrist into the door frame as if you were trying to swing the back of your hand away from your abdomen. Gradually increase the tension until you are pressing as hard as you can without shrugging your shoulder or increasing any shoulder discomfort.  Hold __________ seconds.  Release the tension slowly. Relax your shoulder muscles completely before you the next repetition. Repeat __________ times. Complete this exercise __________ times per day. STRENGTH - Internal Rotators  Secure a rubber exercise band/tubing to a fixed object so that it is at the same height as your right / left elbow when you are standing or sitting on a firm surface.  Stand or sit so that the secured exercise band/tubing is at your right / left side.  Bend your elbow 90 degrees. Place a folded towel or small pillow under your  right / left arm so that your elbow is a few inches away from your side.  Keeping the tension on the exercise band/tubing, pull it across your body toward your abdomen. Be sure to keep your body steady so that the movement is only coming from your shoulder rotating.  Hold __________ seconds. Release the tension in a controlled manner as you return to the starting position. Repeat __________ times. Complete this exercise __________ times per day. STRENGTH - External Rotators  Secure a rubber exercise band/tubing to a fixed object so that it is at the same height as your right / left elbow when you are standing or sitting on a firm  surface.  Stand or sit so that the secured exercise band/tubing is at your side that is not injured.  Bend your elbow 90 degrees. Place a folded towel or small pillow under your right / left arm so that your elbow is a few inches away from your side.  Keeping the tension on the exercise band/tubing, pull it away from your body, as if pivoting on your elbow. Be sure to keep your body steady so that the movement is only coming from your shoulder rotating.  Hold __________ seconds. Release the tension in a controlled manner as you return to the starting position. Repeat __________ times. Complete this exercise __________ times per day. Document Released: 06/29/2005 Document Revised: 09/21/2011 Document Reviewed: 10/11/2008 [facility] Patient Information 2015 [place], [place]. This information is not intended to replace advice given to you by your health care provider. Make sure you discuss any questions you have with your health care provider.

## 2014-04-18 NOTE — ED Notes (Signed)
Patient transported to X-ray 

## 2014-04-19 LAB — URINE CYTOLOGY ANCILLARY ONLY
CHLAMYDIA, DNA PROBE: NEGATIVE
NEISSERIA GONORRHEA: NEGATIVE
Trichomonas: NEGATIVE

## 2014-06-05 ENCOUNTER — Encounter (HOSPITAL_COMMUNITY): Payer: Self-pay | Admitting: Emergency Medicine

## 2014-06-05 ENCOUNTER — Emergency Department (HOSPITAL_COMMUNITY)
Admission: EM | Admit: 2014-06-05 | Discharge: 2014-06-05 | Disposition: A | Discharge status: Home / Self Care | Payer: 59 | Source: Home / Self Care | Attending: Emergency Medicine | Admitting: Emergency Medicine

## 2014-06-05 DIAGNOSIS — S61219A Laceration without foreign body of unspecified finger without damage to nail, initial encounter: Secondary | ICD-10-CM

## 2014-06-05 MED ORDER — LIDOCAINE HCL 2 % IJ SOLN
INTRAMUSCULAR | Status: AC
  Filled 2014-06-05: qty 20

## 2014-06-05 MED ORDER — HYDROCODONE-ACETAMINOPHEN 5-325 MG PO TABS: 1.0000 | ORAL_TABLET | Freq: Four times a day (QID) | ORAL | Status: DC | PRN

## 2014-06-05 NOTE — ED Notes (Signed)
37 year old male here with his son.  Was shaving his head prior to arrival and  He cut his little finger on his left hand with a straight edge shaving razor.

## 2014-06-05 NOTE — ED Provider Notes (Signed)
CSN: [id number]     Arrival date & time 06/05/14  1839 History   First MD Initiated Contact with Patient 06/05/14 1853     No chief complaint on file.  (Consider location/radiation/quality/duration/timing/severity/associated sxs/prior Treatment) HPI He is a 38 year old man here for evaluation of laceration. He cut his left ring finger shortly before arriving with a straight razor. He states he was shaving his head, when the razor slipped and went across his ring finger. He rinsed it in water. He states his last tetanus shot was within the last 5 years.  Past Medical History  Diagnosis Date  . Hypertension   . Diabetes mellitus without complication    No past surgical history on file. No family history on file. History  Substance Use Topics  . Smoking status: Former Research scientist (life sciences)  . Smokeless tobacco: Not on file  . Alcohol Use: Yes     Comment: occasionally    Review of Systems wound Allergies  Review of patient's allergies indicates no known allergies.  Home Medications   Prior to Admission medications   Medication Sig Start Date End Date Taking? Authorizing Provider  cyclobenzaprine (FLEXERIL) 10 MG tablet Take 1 tablet (10 mg total) by mouth at bedtime as needed for muscle spasms. 04/18/14   Gregor Hams, MD  diclofenac (VOLTAREN) 50 MG EC tablet Take 1 tablet (50 mg total) by mouth 2 (two) times daily as needed. 04/18/14   Gregor Hams, MD  HYDROcodone-acetaminophen (NORCO) 5-325 MG per tablet Take 1 tablet by mouth every 6 (six) hours as needed for moderate pain. 06/05/14   Melony Overly, MD  lisinopril (PRINIVIL,ZESTRIL) 10 MG tablet Take 10 mg by mouth daily.    Historical Provider, MD  traMADol (ULTRAM) 50 MG tablet Take 1 tablet (50 mg total) by mouth every 6 (six) hours as needed. 04/18/14   Gregor Hams, MD   BP 142/79 mmHg  Pulse 76  Temp(Src) 98.7 F (37.1 C) (Oral)  Resp 18  SpO2 100% Physical Exam  Constitutional: He is oriented to person, place, and time. He  appears well-developed and well-nourished. No distress.  Cardiovascular: Normal rate.   Pulmonary/Chest: Effort normal.  Musculoskeletal:       Hands: Neurological: He is alert and oriented to person, place, and time.  Skin:  L shaped flap laceration to dorsal ring finger over proximal phalanx.  He is able to fully extend and flex the finger.    ED Course  LACERATION REPAIR Date/Time: 06/05/2014 7:24 PM Performed by: Melony Overly Authorized by: Melony Overly Consent: Verbal consent obtained. Risks and benefits: risks, benefits and alternatives were discussed Consent given by: patient Patient understanding: patient states understanding of the procedure being performed Patient identity confirmed: verbally with patient Time out: Immediately prior to procedure a "time out" was called to verify the correct patient, procedure, equipment, support staff and site/side marked as required. Body area: upper extremity Location details: left ring finger Laceration length: 2 cm Tendon involvement: none Anesthesia: digital block Local anesthetic: lidocaine 2% without epinephrine Anesthetic total: 2 ml Irrigation solution: saline Irrigation method: syringe Amount of cleaning: standard Wound skin closure material used: steristrips. Approximation: close Approximation difficulty: simple Dressing: 4x4 sterile gauze Patient tolerance: Patient tolerated the procedure well with no immediate complications   (including critical care time) Labs Review Labs Reviewed - No data to display  Imaging Review No results found.   MDM   1. Laceration of finger of left hand, initial encounter  Skin flap secured with Steri-Strips. Wound care discussed as in after visit summary. Prescription for Norco provided to use for severe pain. Reviewed signs of infection. Follow-up as needed.    Melony Overly, MD 06/05/14 [phone]

## 2014-06-05 NOTE — Discharge Instructions (Signed)
Laceration Care, Adult A laceration is a cut that goes through all layers of the skin. The cut goes into the tissue beneath the skin. HOME CARE For skin adhesive strips:  Keep the cut clean and dry.  Do not get the strips wet. You may take a bath, but be careful to keep the cut dry.  If the cut gets wet, pat it dry with a clean towel.  The strips will fall off on their own. Do not remove the strips that are still stuck to the cut. You may need a tetanus shot if:  You cannot remember when you had your last tetanus shot.  You have never had a tetanus shot. If you need a tetanus shot and you choose not to have one, you may get tetanus. Sickness from tetanus can be serious. GET HELP RIGHT AWAY IF:   Your pain does not get better with medicine.  Your arm, hand, leg, or foot loses feeling (numbness) or changes color.  Your cut is bleeding.  Your joint feels weak, or you cannot use your joint.  You have painful lumps on your body.  Your cut is red, puffy (swollen), or painful.  You have a red line on the skin near the cut.  You have yellowish-white fluid (pus) coming from the cut.  You have a fever.  You have a bad smell coming from the cut or bandage.  Your cut breaks open before or after stitches are removed.  You notice something coming out of the cut, such as wood or glass.  You cannot move a finger or toe. MAKE SURE YOU:   Understand these instructions.  Will watch your condition.  Will get help right away if you are not doing well or get worse. Document Released: 12/16/2007 Document Revised: 09/21/2011 Document Reviewed: 12/23/2010 [facility] Patient Information 2015 [place], [place]. This information is not intended to replace advice given to you by your health care provider. Make sure you discuss any questions you have with your health care provider.

## 2014-08-16 ENCOUNTER — Encounter (HOSPITAL_COMMUNITY): Payer: Self-pay | Admitting: *Deleted

## 2014-08-16 ENCOUNTER — Emergency Department (HOSPITAL_COMMUNITY)
Admission: EM | Admit: 2014-08-16 | Discharge: 2014-08-16 | Disposition: A | Discharge status: Home / Self Care | Payer: 59 | Source: Home / Self Care | Attending: Emergency Medicine | Admitting: Emergency Medicine

## 2014-08-16 DIAGNOSIS — J020 Streptococcal pharyngitis: Secondary | ICD-10-CM

## 2014-08-16 LAB — POCT RAPID STREP A: Streptococcus, Group A Screen (Direct): POSITIVE — AB

## 2014-08-16 MED ORDER — HYDROCOD POLST-CHLORPHEN POLST 10-8 MG/5ML PO LQCR: 5.0000 mL | Freq: Two times a day (BID) | ORAL | Status: DC | PRN

## 2014-08-16 MED ORDER — CLINDAMYCIN HCL 300 MG PO CAPS: 300.0000 mg | ORAL_CAPSULE | Freq: Four times a day (QID) | ORAL | Status: DC

## 2014-08-16 MED ORDER — IPRATROPIUM BROMIDE 0.06 % NA SOLN: 2.0000 | Freq: Four times a day (QID) | NASAL | Status: DC

## 2014-08-16 NOTE — Discharge Instructions (Signed)

## 2014-08-16 NOTE — ED Notes (Signed)
Pt  Reports   X  3 daysb   Hurts  To  Swallow      Runny  Nose      -  Got  Over a  Sinus  Infection   3  Weeks  Ago  And   Took          Anti  Biotics

## 2014-08-16 NOTE — ED Provider Notes (Signed)
Chief Complaint   Sore Throat   History of Present Illness   Adam Wiley is a 39 year old male who's had a 3 to four-day history of sore throat, subjective fever, chills, sweats, nasal congestion, rhinorrhea, and cough. Of interest is the fact that the patient just finished up a course of Augmentin for sinus infection 2 days ago. No known sick exposures.   Review of Systems   Other than as noted above, the patient denies any of the following symptoms. Systemic:  No fever, chills, sweats, myalgias, or headache. Eye:  No redness, pain or drainage. ENT:  No earache, nasal congestion, sneezing, rhinorrhea, sinus pressure, sinus pain, or post nasal drip. Lungs:  No cough, sputum production, wheezing, shortness of breath, or chest pain. GI:  No abdominal pain, nausea, vomiting, or diarrhea. Skin:  No rash.  [place]   Past medical history, family history, social history, meds, and allergies were reviewed. He has diabetes, hypertension, and hypercholesterolemia. Takes lisinopril, Lipofen, and Kombiglyze.  Physical Exam     Vital signs:  BP 130/80 mmHg  Pulse 78  Temp(Src) 99.2 F (37.3 C) (Oral)  Resp 18  SpO2 100% General:  Alert, in no distress. Phonation was normal, no drooling, and patient was able to handle secretions well.  Eye:  No conjunctival injection or drainage. Lids were normal. ENT:  TMs and canals were normal, without erythema or inflammation.  Nasal mucosa was clear and uncongested, without drainage.  Mucous membranes were moist.  Exam of pharynx tonsils were enlarged and red but no exudate.  There were no oral ulcerations or lesions. There was no bulging of the tonsillar pillars, and the uvula was midline. Neck:  Supple, no adenopathy, tenderness or mass. Lungs:  No respiratory distress.  Lungs were clear to auscultation, without wheezes, rales or rhonchi.  Breath sounds were clear and equal bilaterally.  Heart:  Regular rhythm, without gallops, murmers or  rubs. Skin:  Clear, warm, and dry, without rash or lesions.  Labs   Results for orders placed or performed during the hospital encounter of 08/16/14  POCT rapid strep A [facility])  Result Value Ref Range   Streptococcus, Group A Screen (Direct) POSITIVE (A) NEGATIVE    Assessment   The encounter diagnosis was Strep throat.  There is no evidence of a peritonsillar abscess, retropharyngeal abscess, or epiglottitis.  It is surprising that he developed strep throat when he just finished up a course of Augmentin. It's possible that this could be just an asymptomatic colonization and he has a superimposed viral upper respiratory infection. We'll cover with clindamycin because of possibility of resistance to Augmentin.  Plan     1.  Meds:  The following meds were prescribed:   Discharge Medication List as of 08/16/2014 11:21 AM    START taking these medications   Details  chlorpheniramine-HYDROcodone (TUSSIONEX) 10-8 MG/5ML LQCR Take 5 mLs by mouth every 12 (twelve) hours as needed for cough., Starting 08/16/2014, Until Discontinued, Normal    clindamycin (CLEOCIN) 300 MG capsule Take 1 capsule (300 mg total) by mouth 4 (four) times daily., Starting 08/16/2014, Until Discontinued, Normal    ipratropium (ATROVENT) 0.06 % nasal spray Place 2 sprays into both nostrils 4 (four) times daily., Starting 08/16/2014, Until Discontinued, Normal        2.  Patient Education/Counseling:  The patient was given appropriate handouts, self care instructions, and instructed in symptomatic relief, including hot saline gargles, throat lozenges, infectious precautions, and need to trade out  toothbrush.    3.  Follow up:  The patient was told to follow up here if no better in 3 to 4 days, or sooner if becoming worse in any way, and given some red flag symptoms such as difficulty swallowing or breathing which would prompt immediate return.       Harden Mo, MD 08/16/14 2055

## 2014-12-20 ENCOUNTER — Emergency Department (HOSPITAL_COMMUNITY)
Admission: EM | Admit: 2014-12-20 | Discharge: 2014-12-20 | Disposition: A | Discharge status: Home / Self Care | Payer: 59 | Source: Home / Self Care | Attending: Family Medicine | Admitting: Family Medicine

## 2014-12-20 ENCOUNTER — Encounter (HOSPITAL_COMMUNITY): Payer: Self-pay | Admitting: *Deleted

## 2014-12-20 ENCOUNTER — Other Ambulatory Visit (HOSPITAL_COMMUNITY)
Admission: RE | Admit: 2014-12-20 | Discharge: 2014-12-20 | Disposition: A | Discharge status: Home / Self Care | Payer: 59 | Source: Ambulatory Visit | Attending: Family Medicine | Admitting: Family Medicine

## 2014-12-20 DIAGNOSIS — N4889 Other specified disorders of penis: Secondary | ICD-10-CM | POA: Diagnosis not present

## 2014-12-20 DIAGNOSIS — Z113 Encounter for screening for infections with a predominantly sexual mode of transmission: Secondary | ICD-10-CM | POA: Diagnosis present

## 2014-12-20 LAB — POCT URINALYSIS DIP (DEVICE)
BILIRUBIN URINE: NEGATIVE
Glucose, UA: 100 mg/dL — AB
Hgb urine dipstick: NEGATIVE
KETONES UR: NEGATIVE mg/dL
LEUKOCYTES UA: NEGATIVE
Nitrite: NEGATIVE
PROTEIN: NEGATIVE mg/dL
Specific Gravity, Urine: 1.025 (ref 1.005–1.030)
Urobilinogen, UA: 0.2 mg/dL (ref 0.0–1.0)
pH: 6 (ref 5.0–8.0)

## 2014-12-20 MED ORDER — PHENAZOPYRIDINE HCL 100 MG PO TABS: 100.0000 mg | ORAL_TABLET | Freq: Three times a day (TID) | ORAL | Status: DC | PRN

## 2014-12-20 NOTE — ED Notes (Signed)
Pt is here with complaints of possible STD exposure.

## 2014-12-20 NOTE — ED Provider Notes (Signed)
CSN: [id number]     Arrival date & time 12/20/14  1300 History   First MD Initiated Contact with Patient 12/20/14 1318     Chief Complaint  Patient presents with  . Exposure to STD   (Consider location/radiation/quality/duration/timing/severity/associated sxs/prior Treatment) HPI   She states he's here for STD check. Patient had intercourse with a new sexual partner approximate 4 days ago. States that the condom broke. The following morning he developed pain in the head of the penis. Pain is been intermittent throughout each day. Denies discharge, denies penile lesions, denies groin lumps or bumps, fevers, rash, nausea, vomiting, headache, diarrhea,. Symptoms are not getting worse that I getting better either. Patient is not taken anything for her symptoms. Denies frequency.  Past Medical History  Diagnosis Date  . Hypertension   . Diabetes mellitus without complication    History reviewed. No pertinent past surgical history. History reviewed. No pertinent family history. History  Substance Use Topics  . Smoking status: Former Research scientist (life sciences)  . Smokeless tobacco: Not on file  . Alcohol Use: Yes     Comment: occasionally    Review of Systems Per HPI with all other pertinent systems negative.   Allergies  Review of patient's allergies indicates no known allergies.  Home Medications   Prior to Admission medications   Medication Sig Start Date End Date Taking? Authorizing Provider  chlorpheniramine-HYDROcodone (TUSSIONEX) 10-8 MG/5ML LQCR Take 5 mLs by mouth every 12 (twelve) hours as needed for cough. 08/16/14   Harden Mo, MD  cyclobenzaprine (FLEXERIL) 10 MG tablet Take 1 tablet (10 mg total) by mouth at bedtime as needed for muscle spasms. 04/18/14   Gregor Hams, MD  ipratropium (ATROVENT) 0.06 % nasal spray Place 2 sprays into both nostrils 4 (four) times daily. 08/16/14   Harden Mo, MD  lisinopril (PRINIVIL,ZESTRIL) 10 MG tablet Take 10 mg by mouth daily.    Historical Provider,  MD  traMADol (ULTRAM) 50 MG tablet Take 1 tablet (50 mg total) by mouth every 6 (six) hours as needed. 04/18/14   Gregor Hams, MD   BP 122/85 mmHg  Pulse 86  Temp(Src) 98.7 F (37.1 C) (Oral)  Resp 16  SpO2 99% Physical Exam Physical Exam  Constitutional: oriented to person, place, and time. appears well-developed and well-nourished. No distress.  HENT:  Head: Normocephalic and atraumatic.  Eyes: EOMI. PERRL.  Neck: Normal range of motion.  Cardiovascular: RRR, no m/r/g, 2+ distal pulses,  Pulmonary/Chest: Effort normal and breath sounds normal. No respiratory distress.  Abdominal: Soft. Bowel sounds are normal. NonTTP, no distension.  Musculoskeletal: Normal range of motion. Non ttp, no effusion.  Neurological: alert and oriented to person, place, and time.  Skin: Skin is warm. No rash noted. non diaphoretic.  Psychiatric: normal mood and affect. behavior is normal. Judgment and thought content normal.  GU: Circumcised, no penile discharge, no penile lesions, no groin adenopathy, nontender to palpation.  ED Course  Procedures (including critical care time) Labs Review Labs Reviewed  POCT URINALYSIS DIP (DEVICE) - Abnormal; Notable for the following:    Glucose, UA 100 (*)    All other components within normal limits  HIV ANTIBODY (ROUTINE TESTING)  RPR  URINE CYTOLOGY ANCILLARY ONLY    Imaging Review No results found.   MDM  No diagnosis found. UA without evidence of urinary tract infection. History and physical exam could possibly be due to an STD but high level of concern for simple mechanical trauma from intercourse. Patient  to use anti-inflammatories and Tylenol for pain control. Increase fluid intake. Will send for STD panel and call patient if he needs further treatment. Will send urine culture. Patient refusing blood draw for HIV and RPR. Peridium when necessary pain.Waldemar Dickens, MD 12/20/14 1341

## 2014-12-20 NOTE — Discharge Instructions (Signed)
The cause of your pain may be due to an infection or from something called mechanical trauma. This may take some time to improve. Please use anti-inflammatory medicine such as ibuprofen for pain relief. He may also use the Pyridium for additional pain relief. This may turn her urine orange to red. We will let you know if there are any further signs of infection and that she know about treatment options.

## 2014-12-21 LAB — URINE CYTOLOGY ANCILLARY ONLY
Chlamydia: NEGATIVE
Neisseria Gonorrhea: NEGATIVE
Trichomonas: NEGATIVE

## 2014-12-21 NOTE — ED Notes (Signed)
Lab reports negative

## 2015-07-16 MED FILL — KOMBIGLYZE XR 5/500 MG TAB: 5-500 | 30 days supply | Qty: 30 | Fill #2

## 2015-07-16 MED FILL — FENOFIBRATE 150 MG CAPSULE: 150 | 30 days supply | Qty: 30 | Fill #1

## 2015-07-25 DIAGNOSIS — R300 Dysuria: Secondary | ICD-10-CM | POA: Diagnosis not present

## 2015-08-01 ENCOUNTER — Encounter (HOSPITAL_COMMUNITY): Payer: Self-pay

## 2015-08-01 ENCOUNTER — Other Ambulatory Visit (HOSPITAL_COMMUNITY)
Admission: RE | Admit: 2015-08-01 | Discharge: 2015-08-01 | Disposition: A | Discharge status: Home / Self Care | Payer: 59 | Source: Ambulatory Visit | Attending: Family Medicine | Admitting: Family Medicine

## 2015-08-01 ENCOUNTER — Emergency Department (INDEPENDENT_AMBULATORY_CARE_PROVIDER_SITE_OTHER)
Admission: EM | Admit: 2015-08-01 | Discharge: 2015-08-01 | Disposition: A | Discharge status: Home / Self Care | Payer: 59 | Source: Home / Self Care | Attending: Family Medicine | Admitting: Family Medicine

## 2015-08-01 DIAGNOSIS — Z113 Encounter for screening for infections with a predominantly sexual mode of transmission: Secondary | ICD-10-CM | POA: Diagnosis not present

## 2015-08-01 DIAGNOSIS — N342 Other urethritis: Secondary | ICD-10-CM

## 2015-08-01 LAB — POCT URINALYSIS DIP (DEVICE)
Bilirubin Urine: NEGATIVE
GLUCOSE, UA: NEGATIVE mg/dL
Hgb urine dipstick: NEGATIVE
KETONES UR: NEGATIVE mg/dL
Leukocytes, UA: NEGATIVE
Nitrite: POSITIVE — AB
PROTEIN: NEGATIVE mg/dL
Specific Gravity, Urine: 1.025 (ref 1.005–1.030)
UROBILINOGEN UA: 0.2 mg/dL (ref 0.0–1.0)
pH: 7 (ref 5.0–8.0)

## 2015-08-01 MED ORDER — CEPHALEXIN 250 MG PO CAPS: 250.0000 mg | ORAL_CAPSULE | Freq: Three times a day (TID) | ORAL | Status: DC

## 2015-08-01 MED ORDER — CEFTRIAXONE SODIUM 250 MG IJ SOLR
250.0000 mg | Freq: Once | INTRAMUSCULAR | Status: AC
  Administered 2015-08-01: 250 mg via INTRAMUSCULAR

## 2015-08-01 MED ORDER — CEFTRIAXONE SODIUM 250 MG IJ SOLR
INTRAMUSCULAR | Status: AC
  Filled 2015-08-01: qty 250

## 2015-08-01 MED ORDER — AZITHROMYCIN 250 MG PO TABS
ORAL_TABLET | ORAL | Status: AC
  Filled 2015-08-01: qty 4

## 2015-08-01 MED ORDER — AZITHROMYCIN 250 MG PO TABS
1000.0000 mg | ORAL_TABLET | Freq: Once | ORAL | Status: AC
  Administered 2015-08-01: 1000 mg via ORAL

## 2015-08-01 NOTE — ED Provider Notes (Signed)
CSN: [id number]     Arrival date & time 08/01/15  1828 History   First MD Initiated Contact with Patient 08/01/15 1926     Chief Complaint  Patient presents with  . Abdominal Pain   (Consider location/radiation/quality/duration/timing/severity/associated sxs/prior Treatment) HPI Patient presents with chief complaint of tingling of his penis. Symptoms are been present for several days. He states he has similar symptoms several years ago and was diagnosed with a urethral right and was given antibiotics and his symptoms cleared up for the past 4 or 5 years. He states that he is monogamous marriage at this time. States he has had no penile discharge. 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 Past Medical History  Diagnosis Date  . Hypertension   . Diabetes mellitus without complication ([place])    History reviewed. No pertinent past surgical history. No family history on file. Social History  Substance Use Topics  . Smoking status: Former Research scientist (life sciences)  . Smokeless tobacco: None  . Alcohol Use: Yes     Comment: occasionally    Review of Systems ROS +'ve "penile tingling"  Denies: HEADACHE, NAUSEA, ABDOMINAL PAIN, CHEST PAIN, CONGESTION, DYSURIA, SHORTNESS OF BREATH  Allergies  Review of patient's allergies indicates no known allergies.  Home Medications   Prior to Admission medications   Medication Sig Start Date End Date Taking? Authorizing Provider  cephALEXin (KEFLEX) 250 MG capsule Take 1 capsule (250 mg total) by mouth 3 (three) times daily. 08/01/15   Konrad Felix, PA  chlorpheniramine-HYDROcodone (TUSSIONEX) 10-8 MG/5ML LQCR Take 5 mLs by mouth every 12 (twelve) hours as needed for cough. 08/16/14   Harden Mo, MD  cyclobenzaprine (FLEXERIL) 10 MG tablet Take 1 tablet (10 mg total) by mouth at bedtime as needed for muscle spasms. 04/18/14   Gregor Hams, MD  ipratropium (ATROVENT) 0.06 % nasal spray Place 2 sprays into both nostrils 4 (four) times daily.  08/16/14   Harden Mo, MD  lisinopril (PRINIVIL,ZESTRIL) 10 MG tablet Take 10 mg by mouth daily.    Historical Provider, MD  phenazopyridine (PYRIDIUM) 100 MG tablet Take 1-2 tablets (100-200 mg total) by mouth 3 (three) times daily as needed for pain. 12/20/14   Waldemar Dickens, MD  traMADol (ULTRAM) 50 MG tablet Take 1 tablet (50 mg total) by mouth every 6 (six) hours as needed. 04/18/14   Gregor Hams, MD   Meds Ordered and Administered this Visit   Medications  cefTRIAXone (ROCEPHIN) injection 250 mg (250 mg Intramuscular Given 08/01/15 2009)  azithromycin (ZITHROMAX) tablet 1,000 mg (1,000 mg Oral Given 08/01/15 2009)    BP 144/81 mmHg  Pulse 77  Temp(Src) 98.6 F (37 C) (Oral)  Resp 16  SpO2 100% No data found.   Physical Exam  Constitutional: He appears well-developed.  Genitourinary: No penile tenderness.  Nursing note and vitals reviewed.   ED Course  Procedures (including critical care time)  Labs Review Labs Reviewed  POCT URINALYSIS DIP (DEVICE) - Abnormal; Notable for the following:    Nitrite POSITIVE (*)    All other components within normal limits  CYTOLOGY, (ORAL, ANAL, URETHRAL) ANCILLARY ONLY    Imaging Review No results found.   Visual Acuity Review  Right Eye Distance:   Left Eye Distance:   Bilateral Distance:    Right Eye Near:   Left Eye Near:    Bilateral Near:         MDM   1. Urethritis  Discussion with patient that there is no visible signs of infection. He is concerned about the possibility of urethritis. I have advised patient that the only real way of knowing whether this is a urethritis is to do cultures. Patient asked what that involved and this was explained to him. Meatus swab was obtained. Patient would like to have treatment empirically for gonorrhea and Chlamydia although he feels a there is no chance that he will have this. Urinalysis also showed positive nitrites. Medications include ceftriaxone and azithromycin and  Keflex. Cultures will return next week and patient is advised that he will be called if there is anything positive on his cultures. Instructions of care provided discharged home in stable condition.    Konrad Felix, [place] 08/01/15 2124

## 2015-08-01 NOTE — ED Notes (Signed)
Patient complains of stinging sensation when he voids Some clear discharge at the head of his penis  And having some abd pain as well

## 2015-08-01 NOTE — Discharge Instructions (Signed)
Urethritis, Adult °Urethritis is an inflammation of the tube through which urine exits your bladder (urethra).  °CAUSES °Urethritis is often caused by an infection in your urethra. The infection can be viral, like herpes. The infection can also be bacterial, like gonorrhea. °RISK FACTORS °Risk factors of urethritis include: °· Having sex without using a condom. °· Having multiple sexual partners. °· Having poor hygiene. °SIGNS AND SYMPTOMS °Symptoms of urethritis are less noticeable in women than in men. These symptoms include: °· Burning feeling when you urinate (dysuria). °· Discharge from your urethra. °· Blood in your urine (hematuria). °· Urinating more than usual. °DIAGNOSIS  °To confirm a diagnosis of urethritis, your health care provider will do the following: °· Ask about your sexual history. °· Perform a physical exam. °· Have you provide a sample of your urine for lab testing. °· Use a cotton swab to gently collect a sample from your urethra for lab testing. °TREATMENT  °It is important to treat urethritis. Depending on the cause, untreated urethritis may lead to serious genital infections and possibly infertility. Urethritis caused by a bacterial infection is treated with antibiotic medicine. All sexual partners must be treated.  °HOME CARE INSTRUCTIONS °· Do not have sex until the test results are known and treatment is completed, even if your symptoms go away before you finish treatment. °· If you were prescribed an antibiotic, finish it all even if you start to feel better. °SEEK MEDICAL CARE IF:  °· Your symptoms are not improved in 3 days. °· Your symptoms are getting worse. °· You develop abdominal pain or pelvic pain (in women). °· You develop joint pain. °· You have a fever. °SEEK IMMEDIATE MEDICAL CARE IF:  °· You have severe pain in the belly, back, or side. °· You have repeated vomiting. °MAKE SURE YOU: °· Understand these instructions. °· Will watch your condition. °· Will get help right away  if you are not doing well or get worse. °  °This information is not intended to replace advice given to you by your health care provider. Make sure you discuss any questions you have with your health care provider. °  °Document Released: 12/23/2000 Document Revised: 11/13/2014 Document Reviewed: 02/27/2013 °Elsevier Interactive Patient Education ©2016 [employer] ° °

## 2015-08-02 LAB — CYTOLOGY, (ORAL, ANAL, URETHRAL) ANCILLARY ONLY
CHLAMYDIA, DNA PROBE: NEGATIVE
Neisseria Gonorrhea: NEGATIVE

## 2015-08-02 MED FILL — CEPHALEXIN 250 MG CAPSULE: 250 | 7 days supply | Qty: 21 | Fill #0

## 2015-08-09 DIAGNOSIS — N342 Other urethritis: Secondary | ICD-10-CM | POA: Diagnosis not present

## 2015-08-09 DIAGNOSIS — Z87891 Personal history of nicotine dependence: Secondary | ICD-10-CM | POA: Diagnosis not present

## 2015-08-09 MED FILL — DOXYCYCLINE HYCLATE 100 MG: 100 | 10 days supply | Qty: 20 | Fill #0

## 2015-08-09 MED FILL — KOMBIGLYZE XR 5/500 MG TAB: 5-500 | 30 days supply | Qty: 30 | Fill #3

## 2015-08-13 DIAGNOSIS — Z0100 Encounter for examination of eyes and vision without abnormal findings: Secondary | ICD-10-CM | POA: Diagnosis not present

## 2015-08-15 MED FILL — FENOFIBRATE 150 MG CAPSULE: 150 | 30 days supply | Qty: 30 | Fill #2

## 2015-08-15 MED FILL — ESOMEPRAZOLE MAG DR 40 MG C: 40 | 30 days supply | Qty: 30 | Fill #0

## 2015-08-30 ENCOUNTER — Ambulatory Visit (INDEPENDENT_AMBULATORY_CARE_PROVIDER_SITE_OTHER): Payer: 59 | Admitting: Physician Assistant

## 2015-08-30 VITALS — BP 138/64 | HR 104 | Temp 98.2°F | Resp 16 | Ht 72.5 in | Wt 184.4 lb

## 2015-08-30 DIAGNOSIS — L299 Pruritus, unspecified: Secondary | ICD-10-CM

## 2015-08-30 DIAGNOSIS — R21 Rash and other nonspecific skin eruption: Secondary | ICD-10-CM

## 2015-08-30 LAB — COMPREHENSIVE METABOLIC PANEL
ALBUMIN: 5 g/dL (ref 3.6–5.1)
ALK PHOS: 55 U/L (ref 40–115)
ALT: 31 U/L (ref 9–46)
AST: 29 U/L (ref 10–40)
BILIRUBIN TOTAL: 0.3 mg/dL (ref 0.2–1.2)
BUN: 23 mg/dL (ref 7–25)
CALCIUM: 11.4 mg/dL — AB (ref 8.6–10.3)
CO2: 27 mmol/L (ref 20–31)
Chloride: 101 mmol/L (ref 98–110)
Creat: 1.5 mg/dL — ABNORMAL HIGH (ref 0.60–1.35)
Glucose, Bld: 97 mg/dL (ref 65–99)
Potassium: 4.5 mmol/L (ref 3.5–5.3)
Sodium: 134 mmol/L — ABNORMAL LOW (ref 135–146)
TOTAL PROTEIN: 7.7 g/dL (ref 6.1–8.1)

## 2015-08-30 LAB — CBC
HEMATOCRIT: 43.6 % (ref 39.0–52.0)
HEMOGLOBIN: 14.4 g/dL (ref 13.0–17.0)
MCH: 29 pg (ref 26.0–34.0)
MCHC: 33 g/dL (ref 30.0–36.0)
MCV: 87.7 fL (ref 78.0–100.0)
MPV: 10.8 fL (ref 8.6–12.4)
Platelets: 383 10*3/uL (ref 150–400)
RBC: 4.97 MIL/uL (ref 4.22–5.81)
RDW: 14 % (ref 11.5–15.5)
WBC: 9.1 10*3/uL (ref 4.0–10.5)

## 2015-08-30 MED ORDER — HYDROXYZINE HCL 25 MG PO TABS: 12.5000 mg | ORAL_TABLET | Freq: Three times a day (TID) | ORAL | Status: DC | PRN

## 2015-08-30 MED ORDER — HYDROCORTISONE 2.5 % EX CREA: TOPICAL_CREAM | Freq: Two times a day (BID) | CUTANEOUS | Status: DC

## 2015-08-30 MED ORDER — HYDROCORTISONE 2.5 % RE CREA: 1.0000 "application " | TOPICAL_CREAM | Freq: Two times a day (BID) | RECTAL | Status: DC

## 2015-08-30 MED FILL — HYDROCORTISONE 2.5% CREAM: 2.5 | 15 days supply | Qty: 30 | Fill #0

## 2015-08-30 MED FILL — hydrOXYzine HCL 25 MG TABS: 25 | 10 days supply | Qty: 30 | Fill #0

## 2015-08-30 MED FILL — HYDROCORTISONE 2.5% CREAM: 2.5 | 7 days supply | Qty: 30 | Fill #0

## 2015-08-30 NOTE — Patient Instructions (Signed)
Pruritus °Pruritus is an itching feeling. There are many different conditions and factors that can make your skin itchy. Dry skin is one of the most common causes of itching. Most cases of itching do not require medical attention. Itchy skin can turn into a rash.  °HOME CARE INSTRUCTIONS  °Watch your pruritus for any changes. Take these steps to help with your condition:  °Skin Care °· Moisturize your skin as needed. A moisturizer that contains petroleum jelly is best for keeping moisture in your skin. °· Take or apply medicines only as directed by your health care provider. This may include: °¨ Corticosteroid cream. °¨ Anti-itch lotions. °¨ Oral anti-histamines. °· Apply cool compresses to the affected areas. °· Try taking a bath with: °¨ Epsom salts. Follow the instructions on the packaging. You can get these at your local pharmacy or grocery store. °¨ Baking soda. Pour a small amount into the bath as directed by your health care provider. °¨ Colloidal oatmeal. Follow the instructions on the packaging. You can get this at your local pharmacy or grocery store. °· Try applying baking soda paste to your skin. Stir water into baking soda until it reaches a paste-like consistency.   °· Do not scratch your skin. °· Avoid hot showers or baths, which can make itching worse. A cold shower may help with itching as long as you use a moisturizer after. °· Avoid scented soaps, detergents, and perfumes. Use gentle soaps, detergents, perfumes, and other cosmetic products. °General Instructions °· Avoid wearing tight clothes. °· Keep a journal to help track what causes your itch. Write down: °¨ What you eat. °¨ What cosmetic products you use. °¨ What you drink. °¨ What you wear. This includes jewelry. °· Use a humidifier. This keeps the air moist, which helps to prevent dry skin. °SEEK MEDICAL CARE IF: °· The itching does not go away after several days. °· You sweat at night. °· You have weight loss. °· You are unusually  thirsty. °· You urinate more than normal. °· You are more tired than normal. °· You have abdominal pain. °· Your skin tingles. °· You feel weak. °· Your skin or the whites of your eyes look yellow (jaundice). °· Your skin feels numb. °  °This information is not intended to replace advice given to you by your health care provider. Make sure you discuss any questions you have with your health care provider. °  °Document Released: 03/11/2011 Document Revised: 11/13/2014 Document Reviewed: 06/25/2014 °Elsevier Interactive Patient Education ©2016 [employer]. ° °

## 2015-08-30 NOTE — Progress Notes (Signed)
   Subjective:    Patient ID: Adam Wiley, male    DOB: 07/08/1976, 40 y.o.   MRN: [id number]  Chief Complaint  Patient presents with  . Pruritis    Bilateral legs, Back, x 6 days, Son had scabies   Medications, allergies, past medical history, surgical history, family history, social history and problem list reviewed and updated.  HPI  61 yom presents with pruritis.   Started one week ago. Son had scabies from a friend, concerned he might have the same.  Symptoms started gradually. Itching started bilateral arms. Now arms, legs, chest, and back. Some bumps across body that are itching.   Denies fevers, chills. Denies new exposures, new soap, detergent, shampoo, lotions, foods, clothes, etc. Taking benadryl with some relief. Has never had this before. Applies lotion in wintertime, not new.   Review of Systems See HPI.     Objective:   Physical Exam  Constitutional: He is oriented to person, place, and time. He appears well-developed and well-nourished.  Non-toxic appearance. He does not have a sickly appearance. He does not appear ill. No distress.  BP 138/64 mmHg  Pulse 104  Temp(Src) 98.2 F (36.8 C) (Oral)  Resp 16  Ht 6' 0.5" (1.842 m)  Wt 184 lb 6.4 oz (83.643 kg)  BMI 24.65 kg/m2  SpO2 98%   Neurological: He is alert and oriented to person, place, and time.  Skin:  Scattered, diffuse maculopapular rash, most prominent chest and bilateral posterior arms. No rash in webbing of fingers, toes. No rash on palms. No lesions in mouth.   Psychiatric: He has a normal mood and affect. His speech is normal and behavior is normal.      Assessment & Plan:   Itching - Plan: CBC, Comprehensive metabolic panel, hydrOXYzine (ATARAX/VISTARIL) 25 MG tablet, hydrocortisone 2.5 % cream, DISCONTINUED: hydrocortisone (ANUSOL-HC) 2.5 % rectal cream  Rash and nonspecific skin eruption --Unclear etiology --No signs scabies --Will give hydroxyzine for night, continue claritin in  morning, steroid cream for worst itching areas --cmp, cbc today --pt to contact office 2-3 weeks, if persistent will proceed with derm referral  Adam Gutting, PA-C Physician Assistant-Certified [facility]  08/30/2015 1:57 PM

## 2015-08-31 NOTE — Addendum Note (Signed)
Addended byJulieta Gutting on: 08/31/2015 07:45 PM   Modules accepted: Orders

## 2015-09-12 MED FILL — LISINOPRIL-HCTZ 20-12.5 MG: 20-12.5 | 90 days supply | Qty: 90 | Fill #0

## 2015-09-16 DIAGNOSIS — B86 Scabies: Secondary | ICD-10-CM | POA: Diagnosis not present

## 2015-09-16 MED FILL — PERMETHRIN 5% CREAM: 5 | 10 days supply | Qty: 60 | Fill #0

## 2015-09-16 MED FILL — TRIAMCINOLONE 0.1% CREAM: 0.1 | 10 days supply | Qty: 80 | Fill #0

## 2015-09-16 MED FILL — FENOFIBRATE 150 MG CAPSULE: 150 | 30 days supply | Qty: 30 | Fill #3

## 2015-09-16 MED FILL — KOMBIGLYZE XR 5/500 MG TAB: 5-500 | 30 days supply | Qty: 30 | Fill #4

## 2015-09-30 DIAGNOSIS — N08 Glomerular disorders in diseases classified elsewhere: Secondary | ICD-10-CM | POA: Diagnosis not present

## 2015-09-30 DIAGNOSIS — N182 Chronic kidney disease, stage 2 (mild): Secondary | ICD-10-CM | POA: Diagnosis not present

## 2015-09-30 DIAGNOSIS — E1122 Type 2 diabetes mellitus with diabetic chronic kidney disease: Secondary | ICD-10-CM | POA: Diagnosis not present

## 2015-10-18 MED FILL — FENOFIBRATE 150 MG CAPSULE: 150 | 30 days supply | Qty: 30 | Fill #0

## 2015-10-18 MED FILL — KOMBIGLYZE XR 5/500 MG TAB: 5-500 | 30 days supply | Qty: 30 | Fill #5

## 2015-11-18 MED FILL — FENOFIBRATE 150 MG CAPSULE: 150 | 30 days supply | Qty: 30 | Fill #1

## 2015-11-21 MED FILL — KOMBIGLYZE XR 5/500 MG TAB: 5-500 | 30 days supply | Qty: 30 | Fill #0

## 2015-12-19 MED FILL — LISINOPRIL-HCTZ 20-12.5 MG: 20-12.5 | 90 days supply | Qty: 90 | Fill #1

## 2015-12-19 MED FILL — FENOFIBRATE 150 MG CAPSULE: 150 | 30 days supply | Qty: 30 | Fill #2

## 2015-12-19 MED FILL — KOMBIGLYZE XR 5/500 MG TAB: 5-500 | 30 days supply | Qty: 30 | Fill #1

## 2016-01-20 MED FILL — KOMBIGLYZE XR 5/500 MG TAB: 5-500 | 30 days supply | Qty: 30 | Fill #2

## 2016-01-21 MED FILL — FENOFIBRATE 150 MG CAPSULE: 150 | 30 days supply | Qty: 30 | Fill #0

## 2016-01-22 DIAGNOSIS — M545 Low back pain: Secondary | ICD-10-CM | POA: Diagnosis not present

## 2016-01-22 DIAGNOSIS — Z202 Contact with and (suspected) exposure to infections with a predominantly sexual mode of transmission: Secondary | ICD-10-CM | POA: Diagnosis not present

## 2016-01-23 MED FILL — traMADol HCL 50 MG TABS: 50 | 5 days supply | Qty: 20 | Fill #0

## 2016-01-23 MED FILL — DICLOFENAC SOD EC 75 MG TAB: 75 | 30 days supply | Qty: 60 | Fill #0

## 2016-01-23 MED FILL — METHOCARBAMOL 750 MG TABLET: 750 | 10 days supply | Qty: 40 | Fill #0

## 2016-01-23 MED FILL — AZITHROMYCIN 250 MG TABLET: 250 | 1 days supply | Qty: 4 | Fill #0

## 2016-01-27 DIAGNOSIS — E1122 Type 2 diabetes mellitus with diabetic chronic kidney disease: Secondary | ICD-10-CM | POA: Diagnosis not present

## 2016-01-27 DIAGNOSIS — I129 Hypertensive chronic kidney disease with stage 1 through stage 4 chronic kidney disease, or unspecified chronic kidney disease: Secondary | ICD-10-CM | POA: Diagnosis not present

## 2016-01-27 DIAGNOSIS — N08 Glomerular disorders in diseases classified elsewhere: Secondary | ICD-10-CM | POA: Diagnosis not present

## 2016-01-27 DIAGNOSIS — N182 Chronic kidney disease, stage 2 (mild): Secondary | ICD-10-CM | POA: Diagnosis not present

## 2016-01-30 DIAGNOSIS — N342 Other urethritis: Secondary | ICD-10-CM | POA: Diagnosis not present

## 2016-01-30 MED FILL — DOXYCYCLINE HYCLATE 100 MG: 100 | 7 days supply | Qty: 14 | Fill #0

## 2016-02-05 MED FILL — DOXYCYCLINE HYCLATE 100 MG: 100 | 7 days supply | Qty: 14 | Fill #0

## 2016-02-20 MED FILL — KOMBIGLYZE XR 5/500 MG TAB: 5-500 | 30 days supply | Qty: 30 | Fill #3

## 2016-02-20 MED FILL — FENOFIBRATE 150 MG CAPSULE: 150 | 30 days supply | Qty: 30 | Fill #1

## 2016-03-23 MED FILL — LISINOPRIL-HCTZ 20-12.5 MG: 20-12.5 | 90 days supply | Qty: 90 | Fill #2

## 2016-03-23 MED FILL — FENOFIBRATE 150 MG CAPSULE: 150 | 30 days supply | Qty: 30 | Fill #2

## 2016-03-23 MED FILL — KOMBIGLYZE XR 5/500 MG TAB: 5-500 | 30 days supply | Qty: 30 | Fill #0

## 2016-03-25 DIAGNOSIS — R350 Frequency of micturition: Secondary | ICD-10-CM | POA: Diagnosis not present

## 2016-03-25 DIAGNOSIS — R300 Dysuria: Secondary | ICD-10-CM | POA: Diagnosis not present

## 2016-03-26 MED FILL — AZITHROMYCIN 500 MG TABLET: 500 | 1 days supply | Qty: 2 | Fill #0

## 2016-04-15 DIAGNOSIS — R1013 Epigastric pain: Secondary | ICD-10-CM | POA: Diagnosis not present

## 2016-04-15 DIAGNOSIS — Z7251 High risk heterosexual behavior: Secondary | ICD-10-CM | POA: Diagnosis not present

## 2016-04-15 MED FILL — AZITHROMYCIN 500 MG TABLET: 500 | 1 days supply | Qty: 2 | Fill #0

## 2016-04-23 MED FILL — FENOFIBRATE 150 MG CAPSULE: 150 | 30 days supply | Qty: 30 | Fill #3

## 2016-04-23 MED FILL — KOMBIGLYZE XR 5/500 MG TAB: 5-500 | 30 days supply | Qty: 30 | Fill #1

## 2016-05-20 DIAGNOSIS — E1122 Type 2 diabetes mellitus with diabetic chronic kidney disease: Secondary | ICD-10-CM | POA: Diagnosis not present

## 2016-05-20 DIAGNOSIS — I129 Hypertensive chronic kidney disease with stage 1 through stage 4 chronic kidney disease, or unspecified chronic kidney disease: Secondary | ICD-10-CM | POA: Diagnosis not present

## 2016-05-20 DIAGNOSIS — N342 Other urethritis: Secondary | ICD-10-CM | POA: Diagnosis not present

## 2016-05-20 DIAGNOSIS — N182 Chronic kidney disease, stage 2 (mild): Secondary | ICD-10-CM | POA: Diagnosis not present

## 2016-05-20 DIAGNOSIS — N08 Glomerular disorders in diseases classified elsewhere: Secondary | ICD-10-CM | POA: Diagnosis not present

## 2016-05-20 DIAGNOSIS — Z0000 Encounter for general adult medical examination without abnormal findings: Secondary | ICD-10-CM | POA: Diagnosis not present

## 2016-05-20 MED FILL — traMADol HCL 50 MG TABS: 50 | 20 days supply | Qty: 60 | Fill #0

## 2016-05-20 MED FILL — KOMBIGLYZE XR 5/500 MG TAB: 5-500 | 30 days supply | Qty: 30 | Fill #2

## 2016-05-21 MED FILL — CLOTRIMAZOLE-BETAMETHASONE: 1-0.05 | 30 days supply | Qty: 30 | Fill #0

## 2016-05-26 MED FILL — FENOFIBRATE 150 MG CAPSULE: 150 | 30 days supply | Qty: 30 | Fill #0

## 2016-06-08 ENCOUNTER — Ambulatory Visit (INDEPENDENT_AMBULATORY_CARE_PROVIDER_SITE_OTHER): Payer: 59 | Admitting: Family Medicine

## 2016-06-08 VITALS — BP 142/94 | HR 68 | Temp 98.1°F | Resp 16 | Ht 72.5 in | Wt 192.0 lb

## 2016-06-08 DIAGNOSIS — R109 Unspecified abdominal pain: Secondary | ICD-10-CM | POA: Diagnosis not present

## 2016-06-08 DIAGNOSIS — Z202 Contact with and (suspected) exposure to infections with a predominantly sexual mode of transmission: Secondary | ICD-10-CM

## 2016-06-08 DIAGNOSIS — R1084 Generalized abdominal pain: Secondary | ICD-10-CM

## 2016-06-08 LAB — POCT CBC
GRANULOCYTE PERCENT: 62.6 % (ref 37–80)
HEMATOCRIT: 41.8 % — AB (ref 43.5–53.7)
Hemoglobin: 14.7 g/dL (ref 14.1–18.1)
LYMPH, POC: 3.2 (ref 0.6–3.4)
MCH, POC: 30.1 pg (ref 27–31.2)
MCHC: 35.2 g/dL (ref 31.8–35.4)
MCV: 85.7 fL (ref 80–97)
MID (CBC): 0.5 (ref 0–0.9)
MPV: 8.1 fL (ref 0–99.8)
POC GRANULOCYTE: 6.3 (ref 2–6.9)
POC LYMPH %: 32 % (ref 10–50)
POC MID %: 5.4 %M (ref 0–12)
Platelet Count, POC: 324 10*3/uL (ref 142–424)
RBC: 4.87 M/uL (ref 4.69–6.13)
RDW, POC: 14 %
WBC: 10 10*3/uL (ref 4.6–10.2)

## 2016-06-08 LAB — POCT URINALYSIS DIP (MANUAL ENTRY)
BILIRUBIN UA: NEGATIVE
Bilirubin, UA: NEGATIVE
Blood, UA: NEGATIVE
GLUCOSE UA: NEGATIVE
LEUKOCYTES UA: NEGATIVE
Nitrite, UA: NEGATIVE
Protein Ur, POC: NEGATIVE
SPEC GRAV UA: 1.02
Urobilinogen, UA: 0.2
pH, UA: 5.5

## 2016-06-08 LAB — POC MICROSCOPIC URINALYSIS (UMFC): MUCUS RE: ABSENT

## 2016-06-08 NOTE — Patient Instructions (Addendum)
There does not appear to be any evidence of anything acutely going on within the abdomen. If he gets worse at any time return or go to emergency room.  I think this will resolve over the next few days. I would advise taking 2 Aleve twice daily for the pain and inflammation.  We will know the results of the STD testing and will direct any treatment based on the results of the laboratory tests.  Always practice safe sex if you choose to be sexually involved  Return as needed.  IF you received an x-ray today, you will receive an invoice from [facility]. Please contact [facility] at [phone] with questions or concerns regarding your invoice.   IF you received labwork today, you will receive an invoice from [employer]. Please contact [facility] at [phone] with questions or concerns regarding your invoice.   Our billing staff will not be able to assist you with questions regarding bills from these companies.  You will be contacted with the lab results as soon as they are available. The fastest way to get your results is to activate your My Chart account. Instructions are located on the last page of this paperwork. If you have not heard from [place] regarding the results in 2 weeks, please contact this office.

## 2016-06-08 NOTE — Progress Notes (Signed)
Patient ID: Adam Wiley, male    DOB: Apr 01, 1976  Age: 40 y.o. MRN: [id number]  Chief Complaint  Patient presents with  . Abdominal Pain    wants STD check, pain in abdomen since Monday     Subjective:   Patient is been having abdominal pain for several days. He did do weight lifting on Tuesday or so. 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 Several days ago he had oral sex performed on his home and has been anxious about STDs. He wonders whether sex cause the pain. He is diabetic and hypertensive. Takes his medicines faithfully. His blood sugars 120 this morning.  Current allergies, medications, problem list, past/family and social histories reviewed.  Objective:  BP (!) 142/94 (BP Location: Left Arm, Patient Position: Sitting, Cuff Size: Large)   Pulse 68   Temp 98.1 F (36.7 C)   Resp 16   Ht 6' 0.5" (1.842 m)   Wt 192 lb (87.1 kg)   SpO2 100%   BMI 25.68 kg/m   No major acute distress. His chest is clear. Abdomen has normal bowel sounds, soft without masses. Generalized tenderness in both upper quadrants and both groin regions. Normal male external genitalia with testes descended. No hernias. No penile lesions or discharge could be noted.  Assessment & Plan:   Assessment: 1. Abdominal wall pain   2. Possible exposure to STD   3. Generalized abdominal pain       Plan: We'll check some basic labs. Tried to reassure him. Results for orders placed or performed in visit on 06/08/16  POCT CBC  Result Value Ref Range   WBC 10.0 4.6 - 10.2 K/uL   Lymph, poc 3.2 0.6 - 3.4   POC LYMPH PERCENT 32.0 10 - 50 %L   MID (cbc) 0.5 0 - 0.9   POC MID % 5.4 0 - 12 %M   POC Granulocyte 6.3 2 - 6.9   Granulocyte percent 62.6 37 - 80 %G   RBC 4.87 4.69 - 6.13 M/uL   Hemoglobin 14.7 14.1 - 18.1 g/dL   HCT, POC 41.8 (A) 43.5 - 53.7 %   MCV 85.7 80 - 97 fL   MCH, POC 30.1 27 - 31.2 pg   MCHC 35.2 31.8 - 35.4 g/dL   RDW, POC 14.0 %   Platelet Count, POC 324 142 - 424 K/uL   MPV  8.1 0 - 99.8 fL  POCT urinalysis dipstick  Result Value Ref Range   Color, UA yellow yellow   Clarity, UA clear clear   Glucose, UA negative negative   Bilirubin, UA negative negative   Ketones, POC UA negative negative   Spec Grav, UA 1.020    Blood, UA negative negative   pH, UA 5.5    Protein Ur, POC negative negative   Urobilinogen, UA 0.2    Nitrite, UA Negative Negative   Leukocytes, UA Negative Negative  POCT Microscopic Urinalysis ([facility])  Result Value Ref Range   WBC,UR,HPF,POC None None WBC/hpf   RBC,UR,HPF,POC None None RBC/hpf   Bacteria None None, Too numerous to count   Mucus Absent Absent   Epithelial Cells, UR Per Microscopy None None, Too numerous to count cells/hpf     Orders Placed This Encounter  Procedures  . GC/Chlamydia Probe Amp  . HIV antibody  . RPR  . POCT CBC  . POCT urinalysis dipstick  . POCT Microscopic Urinalysis ([facility])    No orders of the defined types were  placed in this encounter.        Patient Instructions   There does not appear to be any evidence of anything acutely going on within the abdomen. If he gets worse at any time return or go to emergency room.  I think this will resolve over the next few days. I would advise taking 2 Aleve twice daily for the pain and inflammation.  We will know the results of the STD testing and will direct any treatment based on the results of the laboratory tests.  Always practice safe sex if you choose to be sexually involved  Return as needed.  IF you received an x-ray today, you will receive an invoice from [facility]. Please contact [facility] at [phone] with questions or concerns regarding your invoice.   IF you received labwork today, you will receive an invoice from [employer]. Please contact [facility] at [phone] with questions or concerns regarding your invoice.   Our billing staff will not be able to assist you with questions  regarding bills from these companies.  You will be contacted with the lab results as soon as they are available. The fastest way to get your results is to activate your My Chart account. Instructions are located on the last page of this paperwork. If you have not heard from [place] regarding the results in 2 weeks, please contact this office.         Return if symptoms worsen or fail to improve.   HOPPER,DAVID, MD 06/08/2016

## 2016-06-09 LAB — GC/CHLAMYDIA PROBE AMP
CT PROBE, AMP APTIMA: NOT DETECTED
GC PROBE AMP APTIMA: NOT DETECTED

## 2016-06-09 LAB — HIV ANTIBODY (ROUTINE TESTING W REFLEX): HIV 1&2 Ab, 4th Generation: NONREACTIVE

## 2016-06-09 LAB — RPR

## 2016-06-26 MED FILL — FENOFIBRATE 150 MG CAPSULE: 150 | 30 days supply | Qty: 30 | Fill #1

## 2016-06-26 MED FILL — KOMBIGLYZE XR 5/500 MG TAB: 5-500 | 30 days supply | Qty: 30 | Fill #3

## 2016-06-30 MED FILL — LISINOPRIL-HCTZ 20-12.5 MG: 20-12.5 | 90 days supply | Qty: 90 | Fill #0

## 2016-07-07 DIAGNOSIS — J209 Acute bronchitis, unspecified: Secondary | ICD-10-CM | POA: Diagnosis not present

## 2016-07-07 DIAGNOSIS — H65191 Other acute nonsuppurative otitis media, right ear: Secondary | ICD-10-CM | POA: Diagnosis not present

## 2016-07-08 MED FILL — AMOX-CLAV 875-125 MG TABLET: 875-125 | 10 days supply | Qty: 20 | Fill #0

## 2016-07-08 MED FILL — CHERATUSSIN AC SYRUP: 100-10 | 6 days supply | Qty: 240 | Fill #0

## 2016-07-08 MED FILL — BENZONATATE 200 MG CAPSULE: 200 | 10 days supply | Qty: 30 | Fill #0

## 2016-07-31 MED FILL — KOMBIGLYZE XR 5/500 MG TAB: 5-500 | 30 days supply | Qty: 30 | Fill #4

## 2016-07-31 MED FILL — FENOFIBRATE 150 MG CAPSULE: 150 | 30 days supply | Qty: 30 | Fill #2

## 2016-08-28 MED FILL — KOMBIGLYZE XR 5/500 MG TAB: 5-500 | 30 days supply | Qty: 30 | Fill #5

## 2016-08-28 MED FILL — FENOFIBRATE 150 MG CAPSULE: 150 | 30 days supply | Qty: 30 | Fill #3

## 2016-10-01 MED FILL — FENOFIBRATE 150 MG CAPSULE: 150 | 30 days supply | Qty: 30 | Fill #4

## 2016-10-01 MED FILL — LISINOPRIL-HCTZ 20-12.5 MG: 20-12.5 | 90 days supply | Qty: 90 | Fill #1

## 2016-10-07 MED FILL — KOMBIGLYZE XR 5/500 MG TAB: 5-500 | 30 days supply | Qty: 30 | Fill #0

## 2016-10-09 DIAGNOSIS — J069 Acute upper respiratory infection, unspecified: Secondary | ICD-10-CM | POA: Diagnosis not present

## 2016-10-09 DIAGNOSIS — J0100 Acute maxillary sinusitis, unspecified: Secondary | ICD-10-CM | POA: Diagnosis not present

## 2016-10-09 MED FILL — FLUTICASONE PROP 50 MCG SPR: 50 | 60 days supply | Qty: 16 | Fill #0

## 2016-10-09 MED FILL — AZELASTINE HCL 137 MCG SPRY: 0.1 | 30 days supply | Qty: 30 | Fill #0

## 2016-10-09 MED FILL — AZITHROMYCIN 250 MG TABLET: 250 | 5 days supply | Qty: 6 | Fill #0

## 2016-11-06 DIAGNOSIS — H02054 Trichiasis without entropian left upper eyelid: Secondary | ICD-10-CM | POA: Diagnosis not present

## 2016-11-10 MED FILL — KOMBIGLYZE XR 5/500 MG TAB: 5-500 | 30 days supply | Qty: 30 | Fill #0

## 2016-11-12 DIAGNOSIS — N50819 Testicular pain, unspecified: Secondary | ICD-10-CM | POA: Diagnosis not present

## 2016-11-12 DIAGNOSIS — Z113 Encounter for screening for infections with a predominantly sexual mode of transmission: Secondary | ICD-10-CM | POA: Diagnosis not present

## 2016-11-12 DIAGNOSIS — R1030 Lower abdominal pain, unspecified: Secondary | ICD-10-CM | POA: Diagnosis not present

## 2016-12-03 DIAGNOSIS — E1122 Type 2 diabetes mellitus with diabetic chronic kidney disease: Secondary | ICD-10-CM | POA: Diagnosis not present

## 2016-12-03 DIAGNOSIS — N182 Chronic kidney disease, stage 2 (mild): Secondary | ICD-10-CM | POA: Diagnosis not present

## 2016-12-03 DIAGNOSIS — I129 Hypertensive chronic kidney disease with stage 1 through stage 4 chronic kidney disease, or unspecified chronic kidney disease: Secondary | ICD-10-CM | POA: Diagnosis not present

## 2016-12-03 DIAGNOSIS — Z7251 High risk heterosexual behavior: Secondary | ICD-10-CM | POA: Diagnosis not present

## 2016-12-03 DIAGNOSIS — N08 Glomerular disorders in diseases classified elsewhere: Secondary | ICD-10-CM | POA: Diagnosis not present

## 2016-12-14 MED FILL — KOMBIGLYZE XR 5/500 MG TAB: 5-500 | 30 days supply | Qty: 30 | Fill #0

## 2016-12-14 MED FILL — FENOFIBRATE 150 MG CAPSULE: 150 | 30 days supply | Qty: 30 | Fill #5

## 2017-01-05 MED FILL — LISINOPRIL-HCTZ 20-12.5 MG: 20-12.5 | 90 days supply | Qty: 90 | Fill #2

## 2017-01-15 MED FILL — FENOFIBRATE 150 MG CAPSULE: 150 | 30 days supply | Qty: 30 | Fill #0

## 2017-01-19 MED FILL — KOMBIGLYZE XR 5/500 MG TAB: 5-500 | 30 days supply | Qty: 30 | Fill #0

## 2017-01-20 ENCOUNTER — Encounter: Payer: Self-pay | Admitting: Family Medicine

## 2017-01-20 ENCOUNTER — Ambulatory Visit (INDEPENDENT_AMBULATORY_CARE_PROVIDER_SITE_OTHER): Payer: 59

## 2017-01-20 ENCOUNTER — Ambulatory Visit (INDEPENDENT_AMBULATORY_CARE_PROVIDER_SITE_OTHER): Payer: 59 | Admitting: Family Medicine

## 2017-01-20 VITALS — BP 137/68 | HR 82 | Temp 98.3°F | Resp 16 | Ht 72.5 in | Wt 191.0 lb

## 2017-01-20 DIAGNOSIS — K5900 Constipation, unspecified: Secondary | ICD-10-CM

## 2017-01-20 DIAGNOSIS — R1030 Lower abdominal pain, unspecified: Secondary | ICD-10-CM

## 2017-01-20 DIAGNOSIS — Z202 Contact with and (suspected) exposure to infections with a predominantly sexual mode of transmission: Secondary | ICD-10-CM

## 2017-01-20 LAB — POCT CBC
Granulocyte percent: 57.9 %G (ref 37–80)
HEMATOCRIT: 42.5 % — AB (ref 43.5–53.7)
Hemoglobin: 14.1 g/dL (ref 14.1–18.1)
LYMPH, POC: 2.5 (ref 0.6–3.4)
MCH, POC: 29.2 pg (ref 27–31.2)
MCHC: 33.1 g/dL (ref 31.8–35.4)
MCV: 88.1 fL (ref 80–97)
MID (cbc): 0.6 (ref 0–0.9)
MPV: 8.5 fL (ref 0–99.8)
PLATELET COUNT, POC: 295 10*3/uL (ref 142–424)
POC Granulocyte: 4.2 (ref 2–6.9)
POC LYMPH %: 34.1 % (ref 10–50)
POC MID %: 8 %M (ref 0–12)
RBC: 4.83 M/uL (ref 4.69–6.13)
RDW, POC: 13.8 %
WBC: 7.2 10*3/uL (ref 4.6–10.2)

## 2017-01-20 LAB — POCT URINALYSIS DIP (MANUAL ENTRY)
BILIRUBIN UA: NEGATIVE
BILIRUBIN UA: NEGATIVE mg/dL
Leukocytes, UA: NEGATIVE
Nitrite, UA: NEGATIVE
PH UA: 5.5 (ref 5.0–8.0)
Protein Ur, POC: NEGATIVE mg/dL
RBC UA: NEGATIVE
Spec Grav, UA: 1.025 (ref 1.010–1.025)
Urobilinogen, UA: 0.2 E.U./dL

## 2017-01-20 LAB — POC MICROSCOPIC URINALYSIS (UMFC): MUCUS RE: ABSENT

## 2017-01-20 NOTE — Progress Notes (Signed)
Patient ID: Adam Wiley, male    DOB: [birth year]  Age: 41 y.o. MRN: [id number]  Chief Complaint  Patient presents with  . STD check  . Abdominal Pain    Lower abd pain x 5 days     Subjective:   41 year old man who has a history of having a condom break about 5 days ago. He is fairly faithful condom use. Has not had SCDs. He has had 2 sexual partners since we last tested him in November.  He is been having some mild low abdominal pains over the last 5 days or so. The bowels act regularly but have changed a little bit in being a little smaller. He has no dysuria or discharge.  Current allergies, medications, problem list, past/family and social histories reviewed.  Objective:  BP 137/68   Pulse 82   Temp 98.3 F (36.8 C) (Oral)   Resp 16   Ht 6' 0.5" (1.842 m)   Wt 191 lb (86.6 kg)   SpO2 100%   BMI 25.55 kg/m   No acute distress. Chest clear. Heart regular without murmurs. Abdomen is soft but has some fullness in the left lower quadrant and lower abdomen and is mildly tender. Genitorectal exam not done.  Assessment & Plan:   Assessment: 1. Possible exposure to STD   2. Lower abdominal pain   3. Constipation, unspecified constipation type       Plan: Risk of STD, will check labs. No abdominal pains which we will check a CBC and urine and x-ray. I suspect it is a low-grade constipation causing this.  Results for orders placed or performed in visit on 01/20/17  POCT CBC  Result Value Ref Range   WBC 7.2 4.6 - 10.2 K/uL   Lymph, poc 2.5 0.6 - 3.4   POC LYMPH PERCENT 34.1 10 - 50 %L   MID (cbc) 0.6 0 - 0.9   POC MID % 8.0 0 - 12 %M   POC Granulocyte 4.2 2 - 6.9   Granulocyte percent 57.9 37 - 80 %G   RBC 4.83 4.69 - 6.13 M/uL   Hemoglobin 14.1 14.1 - 18.1 g/dL   HCT, POC 42.5 (A) 43.5 - 53.7 %   MCV 88.1 80 - 97 fL   MCH, POC 29.2 27 - 31.2 pg   MCHC 33.1 31.8 - 35.4 g/dL   RDW, POC 13.8 %   Platelet Count, POC 295 142 - 424 K/uL   MPV 8.5 0 - 99.8 fL  POCT  Microscopic Urinalysis ([facility])  Result Value Ref Range   WBC,UR,HPF,POC None None WBC/hpf   RBC,UR,HPF,POC None None RBC/hpf   Bacteria Few (A) None, Too numerous to count   Mucus Absent Absent   Epithelial Cells, UR Per Microscopy Few (A) None, Too numerous to count cells/hpf  POCT urinalysis dipstick  Result Value Ref Range   Color, UA yellow yellow   Clarity, UA clear clear   Glucose, UA =500 (A) negative mg/dL   Bilirubin, UA negative negative   Ketones, POC UA negative negative mg/dL   Spec Grav, UA 1.025 1.010 - 1.025   Blood, UA negative negative   pH, UA 5.5 5.0 - 8.0   Protein Ur, POC negative negative mg/dL   Urobilinogen, UA 0.2 0.2 or 1.0 E.U./dL   Nitrite, UA Negative Negative   Leukocytes, UA Negative Negative   Radiologist reading is still pending, but it looks like a lot of stool bulk.  Orders Placed This Encounter  Procedures  .  GC/Chlamydia Probe Amp  . DG Abd 2 Views    Standing Status:   Future    Number of Occurrences:   1    Standing Expiration Date:   01/20/2018    Order Specific Question:   Reason for Exam (SYMPTOM  OR DIAGNOSIS REQUIRED)    Answer:   low abdominal pain    Order Specific Question:   Preferred imaging location?    Answer:   External  . HIV antibody  . RPR  . POCT CBC  . POCT Microscopic Urinalysis ([facility])  . POCT urinalysis dipstick    No orders of the defined types were placed in this encounter.        Patient Instructions   We will let you know the results of the STD testing in a few days.  For the discomfort in the abdomen, I think you are a little constipated up high. Take MiraLAX 1 dose once daily (can take twice daily for a couple of days if needed to get you moving) and then use on an as-needed basis if needed for good bowel movement.  Return if abdominal pain gets worse at anytime, or go to the emergency room if necessary.    IF you received an x-ray today, you will receive an invoice from [facility].  Please contact [facility] at [phone] with questions or concerns regarding your invoice.   IF you received labwork today, you will receive an invoice from [place]. Please contact [facility] at [phone] with questions or concerns regarding your invoice.   Our billing staff will not be able to assist you with questions regarding bills from these companies.  You will be contacted with the lab results as soon as they are available. The fastest way to get your results is to activate your My Chart account. Instructions are located on the last page of this paperwork. If you have not heard from [place] regarding the results in 2 weeks, please contact this office.         Return if symptoms worsen or fail to improve.   HOPPER,DAVID, MD 01/20/2017

## 2017-01-20 NOTE — Patient Instructions (Addendum)
We will let you know the results of the STD testing in a few days.  For the discomfort in the abdomen, I think you are a little constipated up high. Take MiraLAX 1 dose once daily (can take twice daily for a couple of days if needed to get you moving) and then use on an as-needed basis if needed for good bowel movement.  Return if abdominal pain gets worse at anytime, or go to the emergency room if necessary.    IF you received an x-ray today, you will receive an invoice from [facility]. Please contact [facility] at [phone] with questions or concerns regarding your invoice.   IF you received labwork today, you will receive an invoice from [place]. Please contact [facility] at [phone] with questions or concerns regarding your invoice.   Our billing staff will not be able to assist you with questions regarding bills from these companies.  You will be contacted with the lab results as soon as they are available. The fastest way to get your results is to activate your My Chart account. Instructions are located on the last page of this paperwork. If you have not heard from [place] regarding the results in 2 weeks, please contact this office.

## 2017-01-21 LAB — HIV ANTIBODY (ROUTINE TESTING W REFLEX): HIV Screen 4th Generation wRfx: NONREACTIVE

## 2017-01-21 LAB — RPR: RPR: NONREACTIVE

## 2017-01-22 LAB — GC/CHLAMYDIA PROBE AMP
CHLAMYDIA, DNA PROBE: NEGATIVE
NEISSERIA GONORRHOEAE BY PCR: NEGATIVE

## 2017-02-15 DIAGNOSIS — J019 Acute sinusitis, unspecified: Secondary | ICD-10-CM | POA: Diagnosis not present

## 2017-02-15 MED FILL — AZITHROMYCIN 250 MG TABLET: 250 | 5 days supply | Qty: 6 | Fill #0

## 2017-02-16 MED FILL — FENOFIBRATE 150 MG CAPSULE: 150 | 30 days supply | Qty: 30 | Fill #1

## 2017-02-18 MED FILL — KOMBIGLYZE XR 5/500 MG TAB: 5-500 | 30 days supply | Qty: 30 | Fill #0

## 2017-03-03 DIAGNOSIS — R1030 Lower abdominal pain, unspecified: Secondary | ICD-10-CM | POA: Diagnosis not present

## 2017-03-03 DIAGNOSIS — N182 Chronic kidney disease, stage 2 (mild): Secondary | ICD-10-CM | POA: Diagnosis not present

## 2017-03-03 DIAGNOSIS — E1122 Type 2 diabetes mellitus with diabetic chronic kidney disease: Secondary | ICD-10-CM | POA: Diagnosis not present

## 2017-03-03 DIAGNOSIS — N50811 Right testicular pain: Secondary | ICD-10-CM | POA: Diagnosis not present

## 2017-03-03 DIAGNOSIS — R309 Painful micturition, unspecified: Secondary | ICD-10-CM | POA: Diagnosis not present

## 2017-03-03 DIAGNOSIS — R10813 Right lower quadrant abdominal tenderness: Secondary | ICD-10-CM | POA: Diagnosis not present

## 2017-03-08 MED FILL — KOMBIGLYZE XR 5-1,000 MG TA: 5-1000 | 30 days supply | Qty: 30 | Fill #0

## 2017-03-18 MED FILL — ESOMEPRAZOLE MAG DR 40 MG C: 40 | 30 days supply | Qty: 30 | Fill #0

## 2017-03-18 MED FILL — FENOFIBRATE 150 MG CAPSULE: 150 | 30 days supply | Qty: 30 | Fill #2

## 2017-03-24 ENCOUNTER — Other Ambulatory Visit: Payer: Self-pay | Admitting: Nurse Practitioner

## 2017-03-24 DIAGNOSIS — R1084 Generalized abdominal pain: Secondary | ICD-10-CM

## 2017-03-26 ENCOUNTER — Ambulatory Visit
Admission: RE | Admit: 2017-03-26 | Discharge: 2017-03-26 | Disposition: A | Discharge status: Home / Self Care | Payer: 59 | Source: Ambulatory Visit | Attending: Nurse Practitioner | Admitting: Nurse Practitioner

## 2017-03-26 DIAGNOSIS — R1084 Generalized abdominal pain: Secondary | ICD-10-CM | POA: Diagnosis not present

## 2017-04-07 MED FILL — LISINOPRIL-HCTZ 20-12.5 MG: 20-12.5 | 90 days supply | Qty: 90 | Fill #0

## 2017-04-13 DIAGNOSIS — Z23 Encounter for immunization: Secondary | ICD-10-CM | POA: Diagnosis not present

## 2017-04-19 MED FILL — FENOFIBRATE 150 MG CAPSULE: 150 | 30 days supply | Qty: 30 | Fill #3

## 2017-04-21 MED FILL — KOMBIGLYZE XR 5-1,000 MG TA: 5-1000 | 30 days supply | Qty: 30 | Fill #1

## 2017-04-29 MED FILL — FREESTYLE LITE METER: 30 days supply | Qty: 1 | Fill #0

## 2017-04-29 MED FILL — FREESTYLE LITE TEST STRIP: 90 days supply | Qty: 300 | Fill #0

## 2017-04-29 MED FILL — FREESTYLE LANCETS: 90 days supply | Qty: 300 | Fill #0

## 2017-05-10 DIAGNOSIS — E119 Type 2 diabetes mellitus without complications: Secondary | ICD-10-CM | POA: Diagnosis not present

## 2017-05-10 DIAGNOSIS — Z0100 Encounter for examination of eyes and vision without abnormal findings: Secondary | ICD-10-CM | POA: Diagnosis not present

## 2017-05-24 MED FILL — FENOFIBRATE 150 MG CAPSULE: 150 | 30 days supply | Qty: 30 | Fill #0

## 2017-05-24 MED FILL — KOMBIGLYZE XR 5-1,000 MG TA: 5-1000 | 30 days supply | Qty: 30 | Fill #0

## 2017-05-31 DIAGNOSIS — Z7251 High risk heterosexual behavior: Secondary | ICD-10-CM | POA: Diagnosis not present

## 2017-05-31 DIAGNOSIS — E1122 Type 2 diabetes mellitus with diabetic chronic kidney disease: Secondary | ICD-10-CM | POA: Diagnosis not present

## 2017-05-31 DIAGNOSIS — N182 Chronic kidney disease, stage 2 (mild): Secondary | ICD-10-CM | POA: Diagnosis not present

## 2017-05-31 DIAGNOSIS — N50819 Testicular pain, unspecified: Secondary | ICD-10-CM | POA: Diagnosis not present

## 2017-05-31 DIAGNOSIS — R109 Unspecified abdominal pain: Secondary | ICD-10-CM | POA: Diagnosis not present

## 2017-05-31 DIAGNOSIS — N08 Glomerular disorders in diseases classified elsewhere: Secondary | ICD-10-CM | POA: Diagnosis not present

## 2017-05-31 DIAGNOSIS — I129 Hypertensive chronic kidney disease with stage 1 through stage 4 chronic kidney disease, or unspecified chronic kidney disease: Secondary | ICD-10-CM | POA: Diagnosis not present

## 2017-05-31 DIAGNOSIS — Z0000 Encounter for general adult medical examination without abnormal findings: Secondary | ICD-10-CM | POA: Diagnosis not present

## 2017-06-24 MED FILL — KOMBIGLYZE XR 5-1,000 MG TA: 5-1000 | 30 days supply | Qty: 30 | Fill #1

## 2017-06-24 MED FILL — FENOFIBRATE 150 MG CAPSULE: 150 | 30 days supply | Qty: 30 | Fill #1

## 2017-07-12 MED FILL — LISINOPRIL-HCTZ 20-12.5 MG: 20-12.5 | 90 days supply | Qty: 90 | Fill #1

## 2017-07-26 MED FILL — KOMBIGLYZE XR 5-1,000 MG TA: 5-1000 | 30 days supply | Qty: 30 | Fill #0

## 2017-07-26 MED FILL — FENOFIBRATE 150 MG CAPSULE: 150 | 30 days supply | Qty: 30 | Fill #2

## 2017-08-11 DIAGNOSIS — R102 Pelvic and perineal pain: Secondary | ICD-10-CM | POA: Diagnosis not present

## 2017-08-26 MED FILL — FENOFIBRATE 150 MG CAPSULE: 150 | 30 days supply | Qty: 30 | Fill #3

## 2017-08-26 MED FILL — KOMBIGLYZE XR 5-1,000 MG TA: 5-1000 | 30 days supply | Qty: 30 | Fill #1

## 2017-08-31 DIAGNOSIS — I129 Hypertensive chronic kidney disease with stage 1 through stage 4 chronic kidney disease, or unspecified chronic kidney disease: Secondary | ICD-10-CM | POA: Diagnosis not present

## 2017-08-31 DIAGNOSIS — F17219 Nicotine dependence, cigarettes, with unspecified nicotine-induced disorders: Secondary | ICD-10-CM | POA: Diagnosis not present

## 2017-08-31 DIAGNOSIS — E1122 Type 2 diabetes mellitus with diabetic chronic kidney disease: Secondary | ICD-10-CM | POA: Diagnosis not present

## 2017-08-31 DIAGNOSIS — N08 Glomerular disorders in diseases classified elsewhere: Secondary | ICD-10-CM | POA: Diagnosis not present

## 2017-08-31 DIAGNOSIS — Z113 Encounter for screening for infections with a predominantly sexual mode of transmission: Secondary | ICD-10-CM | POA: Diagnosis not present

## 2017-08-31 DIAGNOSIS — N182 Chronic kidney disease, stage 2 (mild): Secondary | ICD-10-CM | POA: Diagnosis not present

## 2017-08-31 MED FILL — buPROPion HCL ER (XL) 150 M: 150 | 30 days supply | Qty: 30 | Fill #0

## 2017-09-13 MED FILL — ESOMEPRAZOLE MAG DR 40 MG C: 40 | 30 days supply | Qty: 30 | Fill #1

## 2017-09-30 MED FILL — KOMBIGLYZE XR 5-1,000 MG TA: 5-1000 | 30 days supply | Qty: 30 | Fill #2

## 2017-09-30 MED FILL — FENOFIBRATE 150 MG CAPSULE: 150 | 30 days supply | Qty: 30 | Fill #4

## 2017-10-11 DIAGNOSIS — F17200 Nicotine dependence, unspecified, uncomplicated: Secondary | ICD-10-CM | POA: Diagnosis not present

## 2017-10-11 DIAGNOSIS — Z79899 Other long term (current) drug therapy: Secondary | ICD-10-CM | POA: Diagnosis not present

## 2017-10-11 MED FILL — buPROPion HCL ER (XL) 150 M: 150 | 90 days supply | Qty: 90 | Fill #0

## 2017-10-18 MED FILL — LISINOPRIL-HCTZ 20-12.5 MG: 20-12.5 | 90 days supply | Qty: 90 | Fill #2

## 2017-11-01 MED FILL — KOMBIGLYZE XR 5-1,000 MG TA: 5-1000 | 30 days supply | Qty: 30 | Fill #3

## 2017-11-01 MED FILL — FENOFIBRATE 150 MG CAPSULE: 150 | 30 days supply | Qty: 30 | Fill #5

## 2017-12-02 MED FILL — FENOFIBRATE 150 MG CAPSULE: 150 | 30 days supply | Qty: 30 | Fill #0

## 2017-12-02 MED FILL — KOMBIGLYZE XR 5-1,000 MG TA: 5-1000 | 30 days supply | Qty: 30 | Fill #4

## 2018-01-04 DIAGNOSIS — J320 Chronic maxillary sinusitis: Secondary | ICD-10-CM | POA: Diagnosis not present

## 2018-01-04 DIAGNOSIS — I129 Hypertensive chronic kidney disease with stage 1 through stage 4 chronic kidney disease, or unspecified chronic kidney disease: Secondary | ICD-10-CM | POA: Diagnosis not present

## 2018-01-04 DIAGNOSIS — Z113 Encounter for screening for infections with a predominantly sexual mode of transmission: Secondary | ICD-10-CM | POA: Diagnosis not present

## 2018-01-04 DIAGNOSIS — N08 Glomerular disorders in diseases classified elsewhere: Secondary | ICD-10-CM | POA: Diagnosis not present

## 2018-01-04 DIAGNOSIS — N182 Chronic kidney disease, stage 2 (mild): Secondary | ICD-10-CM | POA: Diagnosis not present

## 2018-01-04 DIAGNOSIS — E1122 Type 2 diabetes mellitus with diabetic chronic kidney disease: Secondary | ICD-10-CM | POA: Diagnosis not present

## 2018-01-04 MED FILL — AMOX-CLAV 875-125 MG TABLET: 875-125 | 7 days supply | Qty: 14 | Fill #0

## 2018-01-04 MED FILL — FENOFIBRATE 150 MG CAPSULE: 150 | 30 days supply | Qty: 30 | Fill #0

## 2018-01-04 MED FILL — KOMBIGLYZE XR 5-1,000 MG TA: 5-1000 | 30 days supply | Qty: 30 | Fill #0

## 2018-01-07 IMAGING — US US ABDOMEN COMPLETE
1 series · 14 of 25 positions shown · non-contrast
Comparison: 01/10/2010 CT without contrast

CLINICAL DATA: Acute generalized abdominal pain

EXAM:
ABDOMEN ULTRASOUND COMPLETE

[Series 1: us abdomen complete · 0.19mm/px · 14 of 81 slices shown]
[im 1/81]
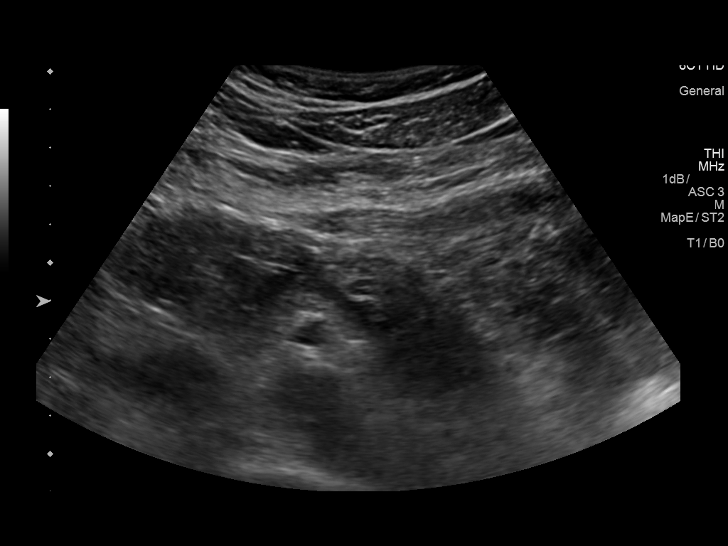
[im 7/81]
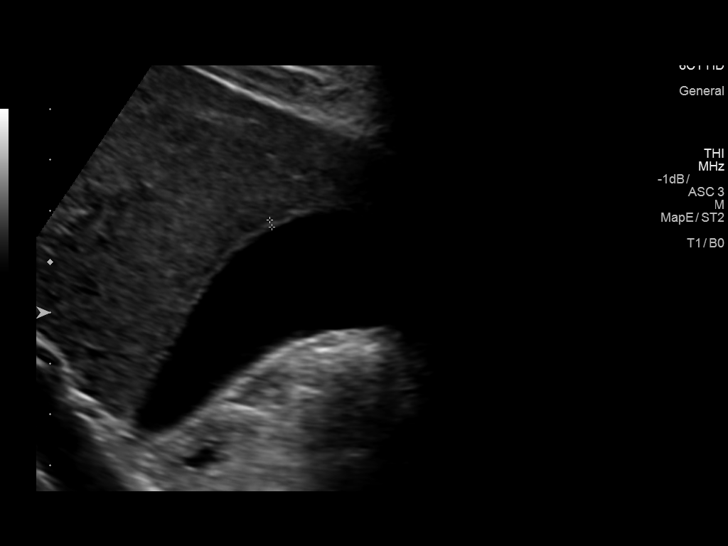
[im 14/81]
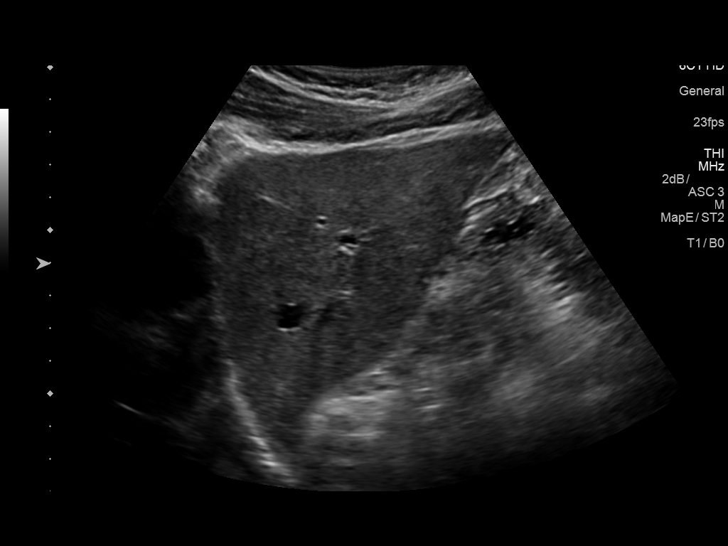
[im 21/81]
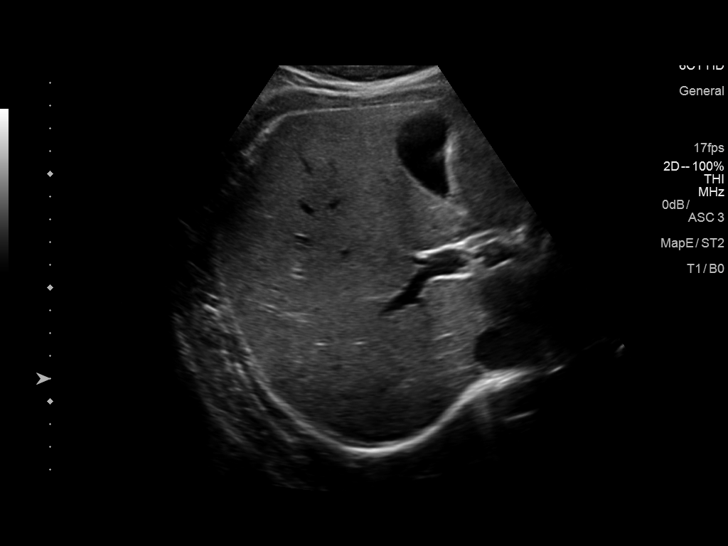
[im 27/81]
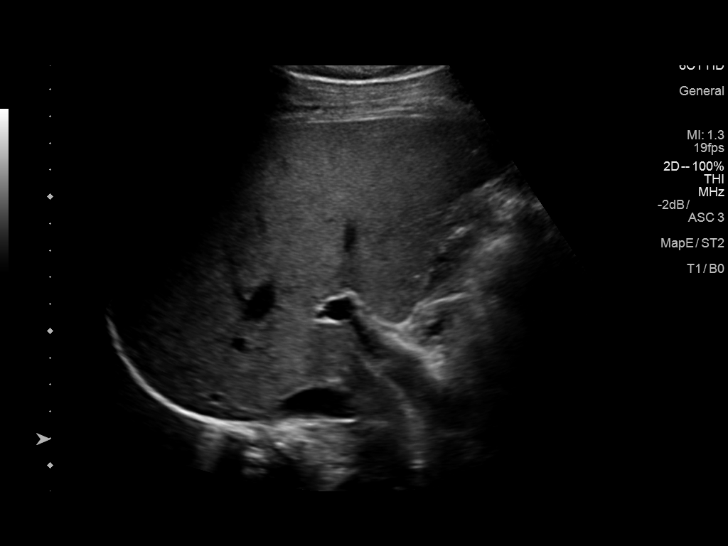
[im 31/81]
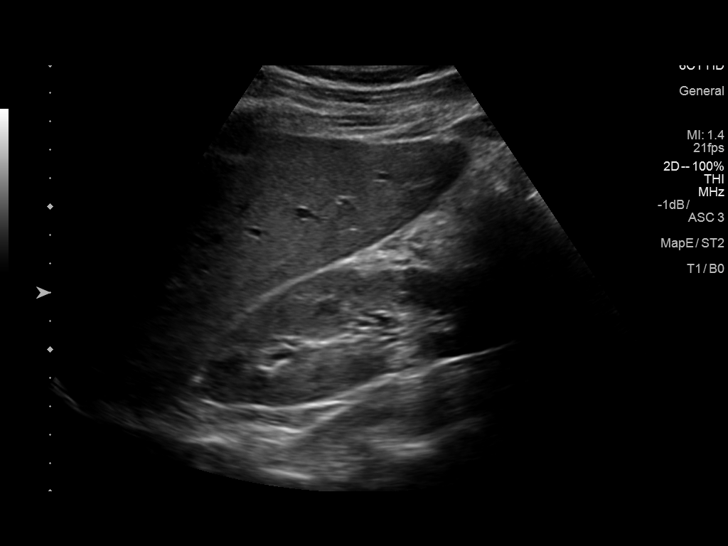
[im 37/81]
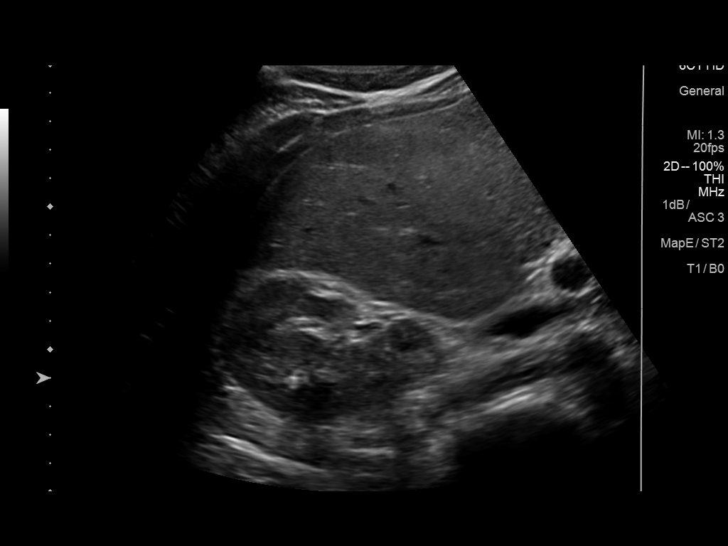
[im 44/81]
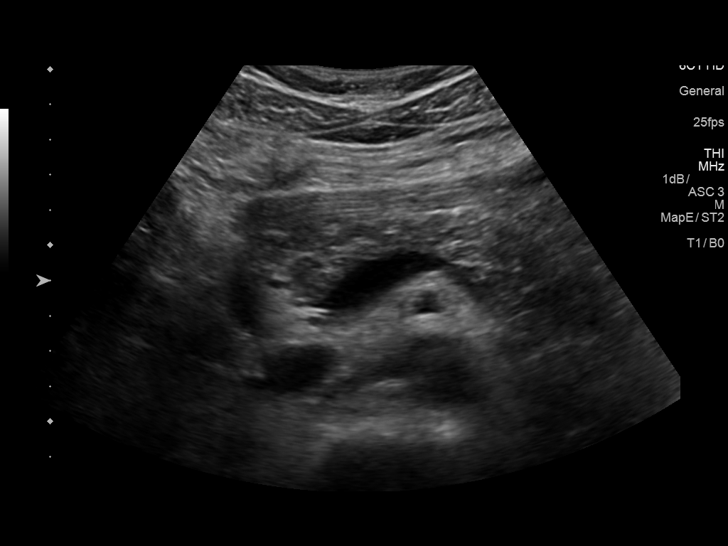
[im 51/81]
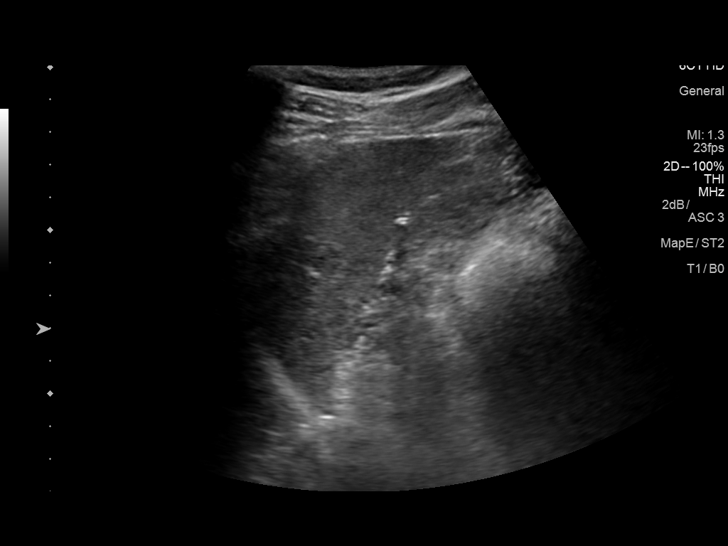
[im 54/81]
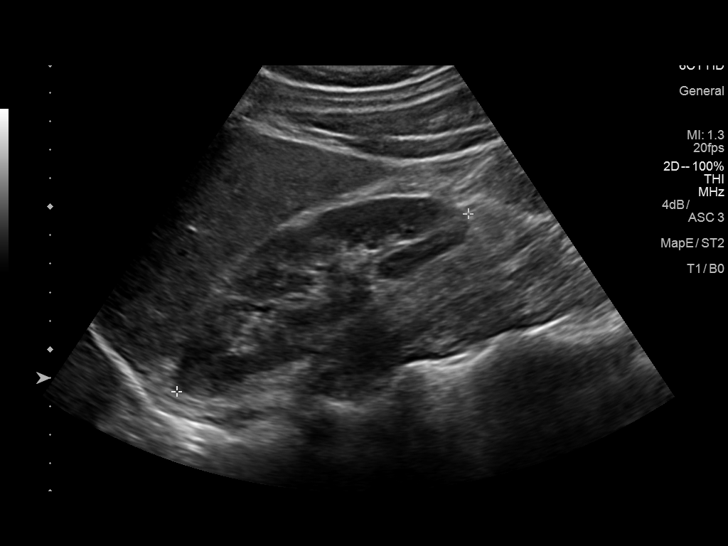
[im 61/81]
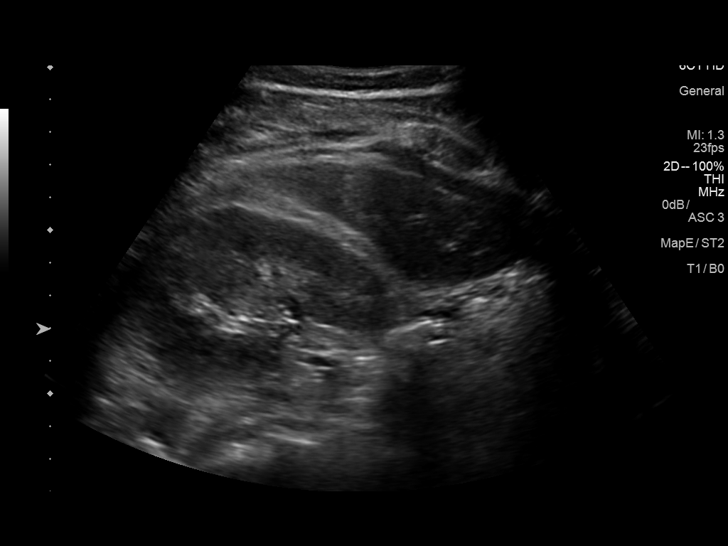
[im 67/81]
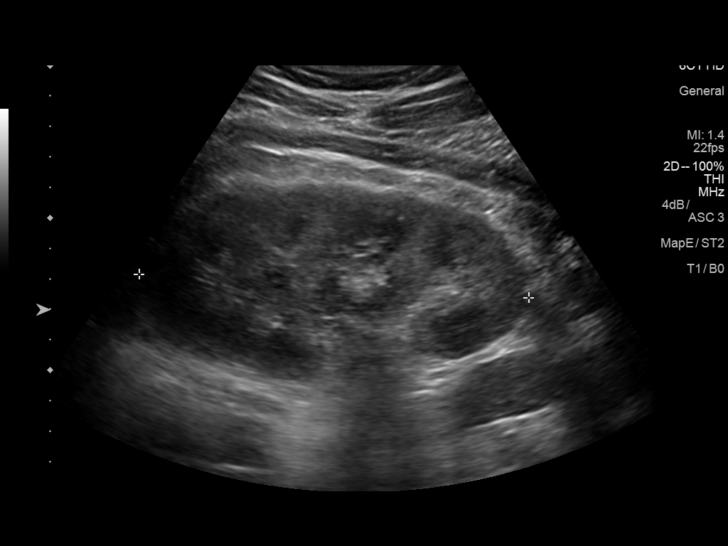
[im 74/81]
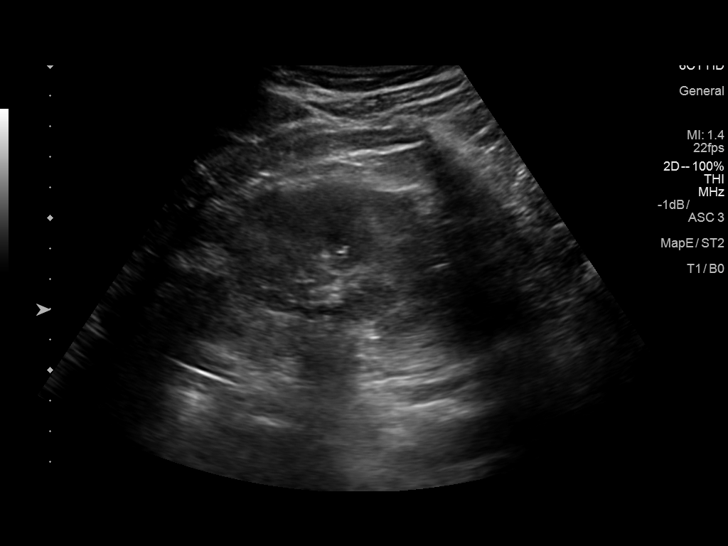
[im 81/81]
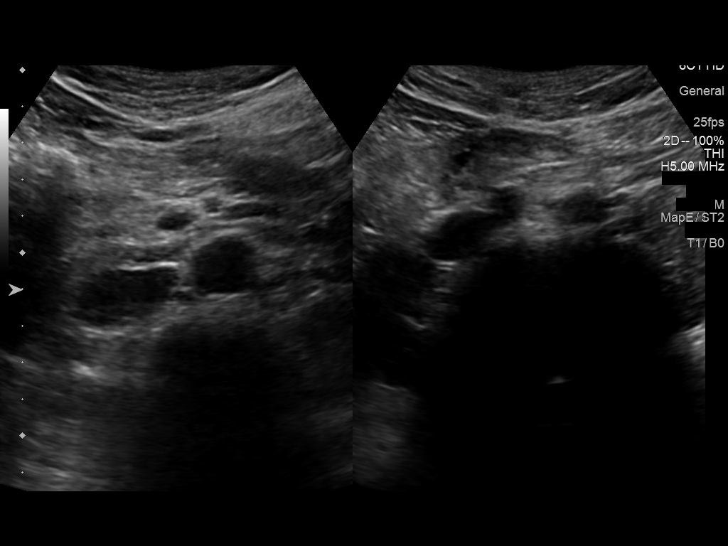

[14 of 25 positions shown; findings below may reference images not displayed]

FINDINGS: Gallbladder: No gallstones or wall thickening visualized. No
sonographic Murphy sign noted by sonographer.

Common bile duct: Diameter: 2.3 mm

Liver: No focal lesion identified. Within normal limits in
parenchymal echogenicity. Portal vein is patent on color Doppler
imaging with normal direction of blood flow towards the liver.

IVC: No abnormality visualized.

Pancreas: Visualized portion unremarkable.

Spleen: Size and appearance within normal limits.

Right Kidney: Length: 12.0 cm. Echogenicity within normal limits. No
mass or hydronephrosis visualized.

Left Kidney: Length: 12.8 cm. Echogenicity within normal limits. No
mass or hydronephrosis visualized.

Abdominal aorta: No aneurysm visualized.

Other findings: No free fluid or ascites
IMPRESSION: Normal abdominal ultrasound

## 2018-01-18 MED FILL — LISINOPRIL-HCTZ 20-12.5 MG: 20-12.5 | 90 days supply | Qty: 90 | Fill #0

## 2018-02-04 MED FILL — FENOFIBRATE 150 MG CAPSULE: 150 | 30 days supply | Qty: 30 | Fill #1

## 2018-02-04 MED FILL — KOMBIGLYZE XR 5-1,000 MG TA: 5-1000 | 30 days supply | Qty: 30 | Fill #1

## 2018-03-10 MED FILL — FENOFIBRATE 150 MG CAPSULE: 150 | 30 days supply | Qty: 30 | Fill #2

## 2018-03-10 MED FILL — KOMBIGLYZE XR 5-1,000 MG TA: 5-1000 | 30 days supply | Qty: 30 | Fill #2

## 2018-04-11 MED FILL — FENOFIBRATE 150 MG CAPSULE: 150 | 30 days supply | Qty: 30 | Fill #3

## 2018-04-11 MED FILL — KOMBIGLYZE XR 5-1,000 MG TA: 5-1000 | 30 days supply | Qty: 30 | Fill #3

## 2018-04-19 MED FILL — LISINOPRIL-HCTZ 20-12.5 MG: 20-12.5 | 90 days supply | Qty: 90 | Fill #1

## 2018-05-02 ENCOUNTER — Ambulatory Visit: Payer: 59 | Admitting: Internal Medicine

## 2018-05-12 DIAGNOSIS — H5213 Myopia, bilateral: Secondary | ICD-10-CM | POA: Diagnosis not present

## 2018-05-12 DIAGNOSIS — E119 Type 2 diabetes mellitus without complications: Secondary | ICD-10-CM | POA: Diagnosis not present

## 2018-05-16 MED FILL — KOMBIGLYZE XR 5-1,000 MG TA: 5-1000 | 30 days supply | Qty: 30 | Fill #4

## 2018-05-16 MED FILL — FENOFIBRATE 150 MG CAPSULE: 150 | 30 days supply | Qty: 30 | Fill #4

## 2018-05-20 DIAGNOSIS — M545 Low back pain: Secondary | ICD-10-CM | POA: Diagnosis not present

## 2018-05-20 DIAGNOSIS — K5900 Constipation, unspecified: Secondary | ICD-10-CM | POA: Diagnosis not present

## 2018-05-20 DIAGNOSIS — M62830 Muscle spasm of back: Secondary | ICD-10-CM | POA: Diagnosis not present

## 2018-05-20 DIAGNOSIS — Z209 Contact with and (suspected) exposure to unspecified communicable disease: Secondary | ICD-10-CM | POA: Diagnosis not present

## 2018-05-20 DIAGNOSIS — Z202 Contact with and (suspected) exposure to infections with a predominantly sexual mode of transmission: Secondary | ICD-10-CM | POA: Diagnosis not present

## 2018-05-20 MED FILL — NAPROXEN 500 MG TABLET: 500 | 15 days supply | Qty: 30 | Fill #0

## 2018-05-20 MED FILL — CYCLOBENZAPRINE 10 MG TAB: 10 | 10 days supply | Qty: 10 | Fill #0

## 2018-06-16 MED FILL — KOMBIGLYZE XR 5-1,000 MG TA: 5-1000 | 30 days supply | Qty: 30 | Fill #5

## 2018-06-16 MED FILL — FENOFIBRATE 150 MG CAPSULE: 150 | 30 days supply | Qty: 30 | Fill #5

## 2018-06-28 ENCOUNTER — Ambulatory Visit: Payer: 59 | Admitting: Internal Medicine

## 2018-06-28 ENCOUNTER — Encounter: Payer: Self-pay | Admitting: Internal Medicine

## 2018-06-28 VITALS — BP 122/66 | HR 77 | Temp 97.5°F | Ht 71.5 in | Wt 188.0 lb

## 2018-06-28 DIAGNOSIS — Z0000 Encounter for general adult medical examination without abnormal findings: Secondary | ICD-10-CM | POA: Diagnosis not present

## 2018-06-28 DIAGNOSIS — I10 Essential (primary) hypertension: Secondary | ICD-10-CM

## 2018-06-28 DIAGNOSIS — E1165 Type 2 diabetes mellitus with hyperglycemia: Secondary | ICD-10-CM | POA: Insufficient documentation

## 2018-06-28 LAB — POCT URINALYSIS DIPSTICK
BILIRUBIN UA: NEGATIVE
Blood, UA: NEGATIVE
GLUCOSE UA: NEGATIVE
KETONES UA: NEGATIVE
Leukocytes, UA: NEGATIVE
Nitrite, UA: NEGATIVE
PH UA: 7 (ref 5.0–8.0)
Protein, UA: NEGATIVE
Spec Grav, UA: 1.01 (ref 1.010–1.025)
UROBILINOGEN UA: 0.2 U/dL

## 2018-06-28 LAB — POCT UA - MICROALBUMIN
ALBUMIN/CREATININE RATIO, URINE, POC: 30
CREATININE, POC: 200 mg/dL
Microalbumin Ur, POC: 10 mg/L

## 2018-06-28 MED ORDER — CYCLOBENZAPRINE HCL 10 MG PO TABS: 10.0000 mg | ORAL_TABLET | Freq: Every evening | ORAL | 0 refills | Status: DC | PRN

## 2018-06-28 NOTE — Patient Instructions (Signed)

## 2018-06-29 LAB — CMP14+EGFR
ALBUMIN: 4.9 g/dL (ref 3.5–5.5)
ALK PHOS: 54 IU/L (ref 39–117)
ALT: 39 IU/L (ref 0–44)
AST: 41 IU/L — ABNORMAL HIGH (ref 0–40)
Albumin/Globulin Ratio: 2.2 (ref 1.2–2.2)
BUN / CREAT RATIO: 9 (ref 9–20)
BUN: 13 mg/dL (ref 6–24)
Bilirubin Total: 0.3 mg/dL (ref 0.0–1.2)
CALCIUM: 10.2 mg/dL (ref 8.7–10.2)
CO2: 24 mmol/L (ref 20–29)
CREATININE: 1.4 mg/dL — AB (ref 0.76–1.27)
Chloride: 100 mmol/L (ref 96–106)
GFR, EST AFRICAN AMERICAN: 70 mL/min/{1.73_m2} (ref >59)
GFR, EST NON AFRICAN AMERICAN: 61 mL/min/{1.73_m2} (ref >59)
Globulin, Total: 2.2 g/dL (ref 1.5–4.5)
Glucose: 99 mg/dL (ref 65–99)
Potassium: 4.2 mmol/L (ref 3.5–5.2)
Sodium: 140 mmol/L (ref 134–144)
TOTAL PROTEIN: 7.1 g/dL (ref 6.0–8.5)

## 2018-06-29 LAB — CBC
HEMATOCRIT: 42.2 % (ref 37.5–51.0)
Hemoglobin: 13.8 g/dL (ref 13.0–17.7)
MCH: 28.9 pg (ref 26.6–33.0)
MCHC: 32.7 g/dL (ref 31.5–35.7)
MCV: 89 fL (ref 79–97)
Platelets: 347 10*3/uL (ref 150–450)
RBC: 4.77 x10E6/uL (ref 4.14–5.80)
RDW: 13.5 % (ref 12.3–15.4)
WBC: 8.8 10*3/uL (ref 3.4–10.8)

## 2018-06-29 LAB — LIPID PANEL
CHOL/HDL RATIO: 3.6 ratio (ref 0.0–5.0)
Cholesterol, Total: 192 mg/dL (ref 100–199)
HDL: 53 mg/dL (ref >39)
LDL Calculated: 119 mg/dL — ABNORMAL HIGH (ref 0–99)
Triglycerides: 100 mg/dL (ref 0–149)
VLDL Cholesterol Cal: 20 mg/dL (ref 5–40)

## 2018-06-29 LAB — HEMOGLOBIN A1C
ESTIMATED AVERAGE GLUCOSE: 160 mg/dL
HEMOGLOBIN A1C: 7.2 % — AB (ref 4.8–5.6)

## 2018-06-29 NOTE — Progress Notes (Signed)
Kw-pls abstract last cmet, hba1c. Your kidney fxn is decreased. Be sure to stay well hydrated/ your blood count is normal. Your LDL, bad chol is 119. Ideally, as a diabetic, this should be less than 70. Are you wiling to take a chol medication? Your hba1c is 7.2, this is not too bad. Continue with current meds.

## 2018-06-30 ENCOUNTER — Other Ambulatory Visit: Payer: Self-pay

## 2018-07-11 NOTE — Progress Notes (Signed)
Subjective:     Patient ID: Adam Wiley , male    DOB: 05-Aug-1975 , 42 y.o.   MRN: [id number]   Chief Complaint  Patient presents with  . Annual Exam  . Diabetes  . Hypertension    HPI  He is here today for a full physical exam. He has no specific concerns or complaints at this time.  Diabetes  He presents for his follow-up diabetic visit. He has type 2 diabetes mellitus. His disease course has been improving. There are no hypoglycemic associated symptoms. Pertinent negatives for diabetes include no blurred vision and no chest pain. There are no hypoglycemic complications. Risk factors for coronary artery disease include diabetes mellitus, hypertension and male sex.  Hypertension  This is a chronic problem. The current episode started more than 1 year ago. The problem has been gradually improving since onset. The problem is controlled. Pertinent negatives include no blurred vision or chest pain.   He reports compliance with medications.   Past Medical History:  Diagnosis Date  . Diabetes mellitus without complication ([place])   . Hypertension      Family History  Problem Relation Age of Onset  . Diabetes Mother   . Diabetes Father      Current Outpatient Medications:  .  fexofenadine (ALLEGRA) 180 MG tablet, Take 180 mg by mouth daily., Disp: , Rfl:  .  Saxagliptin-Metformin (KOMBIGLYZE XR) 11-998 MG TB24, Kombiglyze XR 5 mg-1,000 mg tablet,extended release, Disp: , Rfl:  .  traMADol (ULTRAM) 50 MG tablet, Take 1 tablet (50 mg total) by mouth every 6 (six) hours as needed., Disp: 15 tablet, Rfl: 0 .  cyclobenzaprine (FLEXERIL) 10 MG tablet, Take 1 tablet (10 mg total) by mouth at bedtime as needed for muscle spasms., Disp: 30 tablet, Rfl: 0 .  Fenofibrate 150 MG CAPS, fenofibrate 150 mg capsule, Disp: , Rfl:  .  lisinopril-hydrochlorothiazide (PRINZIDE,ZESTORETIC) 20-12.5 MG tablet, lisinopril 20 mg-hydrochlorothiazide 12.5 mg tablet  Take 1 tablet every day by oral route.,  Disp: , Rfl:    No Known Allergies   Review of Systems  Constitutional: Negative.   HENT: Negative.   Eyes: Negative.  Negative for blurred vision.  Respiratory: Negative.   Cardiovascular: Negative.  Negative for chest pain.  Gastrointestinal: Negative.   Endocrine: Negative.   Genitourinary: Negative.   Musculoskeletal: Negative.   Skin: Negative.   Allergic/Immunologic: Negative.   Neurological: Negative.   Hematological: Negative.   Psychiatric/Behavioral: Negative.      Today's Vitals   06/28/18 1101  BP: 122/66  Pulse: 77  Temp: (!) 97.5 F (36.4 C)  TempSrc: Oral  Weight: 188 lb (85.3 kg)  Height: 5' 11.5" (1.816 m)   Body mass index is 25.86 kg/m.   Objective:  Physical Exam Vitals signs and nursing note reviewed.  Constitutional:      Appearance: Normal appearance.  HENT:     Head: Normocephalic and atraumatic.     Right Ear: Tympanic membrane, ear canal and external ear normal.     Left Ear: Tympanic membrane, ear canal and external ear normal.     Nose: Nose normal.     Mouth/Throat:     Mouth: Mucous membranes are moist.  Neck:     Musculoskeletal: Normal range of motion and neck supple.  Cardiovascular:     Rate and Rhythm: Normal rate and regular rhythm.     Pulses: Normal pulses.          Dorsalis pedis pulses are 2+ on  the right side and 2+ on the left side.     Heart sounds: Normal heart sounds.  Pulmonary:     Effort: Pulmonary effort is normal.     Breath sounds: Normal breath sounds.  Abdominal:     General: Abdomen is flat. Bowel sounds are normal.     Palpations: Abdomen is soft.  Genitourinary:    Comments: Deferred as per patient Musculoskeletal: Normal range of motion.  Feet:     Right foot:     Protective Sensation: 5 sites tested. 5 sites sensed.     Skin integrity: Skin integrity normal.     Left foot:     Protective Sensation: 5 sites tested. 5 sites sensed.     Skin integrity: Skin integrity normal.  Skin:    General:  Skin is warm and dry.  Neurological:     General: No focal deficit present.     Mental Status: He is alert.  Psychiatric:        Mood and Affect: Mood normal.         Assessment And Plan:     1. Routine general medical examination at health care facility  A full exam was performed. He declined DRE.  PATIENT HAS BEEN ADVISED TO GET 30-45 MINUTES REGULAR EXERCISE NO LESS THAN FOUR TO FIVE DAYS PER WEEK - BOTH WEIGHTBEARING EXERCISES AND AEROBIC ARE RECOMMENDED.  HE IS ADVISED TO FOLLOW A HEALTHY DIET WITH AT LEAST SIX FRUITS/VEGGIES PER DAY, DECREASE INTAKE OF RED MEAT, AND TO INCREASE FISH INTAKE TO TWO DAYS PER WEEK.  MEATS/FISH SHOULD NOT BE FRIED, BAKED OR BROILED IS PREFERABLE.  I SUGGEST WEARING SPF 50 SUNSCREEN ON EXPOSED PARTS AND ESPECIALLY WHEN IN THE DIRECT SUNLIGHT FOR AN EXTENDED PERIOD OF TIME.  PLEASE AVOID FAST FOOD RESTAURANTS AND INCREASE YOUR WATER INTAKE.  - CMP14+EGFR - CBC - Lipid panel - Hemoglobin A1c  2. Uncontrolled type 2 diabetes mellitus with hyperglycemia (HCC)  Diabetic foot exam was performed. I DISCUSSED WITH THE PATIENT AT LENGTH REGARDING THE GOALS OF GLYCEMIC CONTROL AND POSSIBLE LONG-TERM COMPLICATIONS.  I  ALSO STRESSED THE IMPORTANCE OF COMPLIANCE WITH HOME GLUCOSE MONITORING, DIETARY RESTRICTIONS INCLUDING AVOIDANCE OF SUGARY DRINKS/PROCESSED FOODS,  ALONG WITH REGULAR EXERCISE.  I  ALSO STRESSED THE IMPORTANCE OF ANNUAL EYE EXAMS, SELF FOOT CARE AND COMPLIANCE WITH OFFICE VISITS. - POCT Urinalysis Dipstick (81002) - POCT UA - Microalbumin  3. Essential hypertension, benign  Well controlled. He will continue with current meds. He is encouraged to avoid adding salt to his foods.   - EKG 12-Lead       [name], MD

## 2018-07-19 MED FILL — LISINOPRIL-HCTZ 20-12.5 MG: 20-12.5 | 90 days supply | Qty: 90 | Fill #2

## 2018-07-19 MED FILL — FENOFIBRATE 150 MG CAPSULE: 150 | 30 days supply | Qty: 30 | Fill #1

## 2018-07-19 MED FILL — KOMBIGLYZE XR 5-1,000 MG TA: 5-1000 | 30 days supply | Qty: 30 | Fill #5

## 2018-07-29 ENCOUNTER — Encounter: Payer: Self-pay | Admitting: Nurse Practitioner

## 2018-07-29 ENCOUNTER — Ambulatory Visit: Payer: 59 | Admitting: Nurse Practitioner

## 2018-07-29 ENCOUNTER — Other Ambulatory Visit: Payer: Self-pay

## 2018-07-29 VITALS — BP 128/68 | HR 72 | Temp 98.2°F | Ht 71.4 in | Wt 190.4 lb

## 2018-07-29 DIAGNOSIS — J069 Acute upper respiratory infection, unspecified: Secondary | ICD-10-CM | POA: Diagnosis not present

## 2018-07-29 MED ORDER — HYDROCODONE-HOMATROPINE 5-1.5 MG/5ML PO SYRP: 5.0000 mL | ORAL_SOLUTION | Freq: Four times a day (QID) | ORAL | 0 refills | Status: DC | PRN

## 2018-07-29 MED ORDER — AZITHROMYCIN 250 MG PO TABS: ORAL_TABLET | ORAL | 0 refills | Status: AC

## 2018-07-29 MED FILL — AZITHROMYCIN 250 MG TABLET: 250 | 5 days supply | Qty: 6 | Fill #0

## 2018-07-29 MED FILL — HYDROCODONE-HOMATROPINE SOL: 5-1.5 | 6 days supply | Qty: 120 | Fill #0

## 2018-07-29 NOTE — Progress Notes (Signed)
Subjective:     Patient ID: Adam Wiley , male    DOB: 12-27-1975 , 43 y.o.   MRN: [id number]   Chief Complaint  Patient presents with  . Cough    scratchy thoat eye pressure sweating     HPI  Cough  This is a new problem. The current episode started in the past 7 days. The problem has been gradually worsening. The problem occurs constantly. The cough is non-productive. Associated symptoms include chills, a sore throat and sweats. Pertinent negatives include no chest pain, fever, headaches, nasal congestion or rhinorrhea. Nothing aggravates the symptoms. There is no history of asthma or pneumonia.     Past Medical History:  Diagnosis Date  . Diabetes mellitus without complication ([place])   . Hypertension      Family History  Problem Relation Age of Onset  . Diabetes Mother   . Diabetes Father      Current Outpatient Medications:  .  cyclobenzaprine (FLEXERIL) 10 MG tablet, Take 1 tablet (10 mg total) by mouth at bedtime as needed for muscle spasms., Disp: 30 tablet, Rfl: 0 .  Fenofibrate 150 MG CAPS, fenofibrate 150 mg capsule, Disp: , Rfl:  .  fexofenadine (ALLEGRA) 180 MG tablet, Take 180 mg by mouth daily., Disp: , Rfl:  .  lisinopril-hydrochlorothiazide (PRINZIDE,ZESTORETIC) 20-12.5 MG tablet, lisinopril 20 mg-hydrochlorothiazide 12.5 mg tablet  Take 1 tablet every day by oral route., Disp: , Rfl:  .  Saxagliptin-Metformin (KOMBIGLYZE XR) 11-998 MG TB24, Kombiglyze XR 5 mg-1,000 mg tablet,extended release, Disp: , Rfl:  .  traMADol (ULTRAM) 50 MG tablet, Take 1 tablet (50 mg total) by mouth every 6 (six) hours as needed., Disp: 15 tablet, Rfl: 0   No Known Allergies   Review of Systems  Constitutional: Positive for chills. Negative for fever.  HENT: Positive for sore throat. Negative for rhinorrhea and sinus pressure.   Eyes: Positive for itching.  Respiratory: Positive for cough.   Cardiovascular: Negative for chest pain, palpitations and leg swelling.   Gastrointestinal: Negative for nausea.  Neurological: Negative for dizziness and headaches.     Today's Vitals   07/29/18 1148  BP: 128/68  Pulse: 72  Temp: 98.2 F (36.8 C)  TempSrc: Oral  SpO2: 94%  Weight: 190 lb 6.4 oz (86.4 kg)  Height: 5' 11.4" (1.814 m)   Body mass index is 26.26 kg/m.   Objective:  Physical Exam Vitals signs reviewed.  Constitutional:      Appearance: Normal appearance. He is ill-appearing.  HENT:     Head: Normocephalic and atraumatic.     Right Ear: Ear canal and external ear normal. There is no impacted cerumen. Tympanic membrane is bulging.     Left Ear: Ear canal and external ear normal. There is no impacted cerumen. Tympanic membrane is bulging.  Eyes:     General: Lids are normal.  Neck:     Musculoskeletal: Normal range of motion and neck supple.  Cardiovascular:     Rate and Rhythm: Normal rate and regular rhythm.  Pulmonary:     Effort: Pulmonary effort is normal.     Breath sounds: Normal breath sounds. No decreased breath sounds.  Chest:     Chest wall: No tenderness.  Lymphadenopathy:     Cervical: Cervical adenopathy present.     Right cervical: No superficial or posterior cervical adenopathy.    Left cervical: No superficial or posterior cervical adenopathy.  Neurological:     Mental Status: He is alert.  Assessment And Plan:     1. Viral upper respiratory tract infection  Periods of chills, ill appearing  Will treat with azithryomycin if not better after completion return call to office.  Encouraged to monitor his blood sugars if become elevated call to office - azithromycin (ZITHROMAX) 250 MG tablet; Take 2 tablets (500 mg) on  Day 1,  followed by 1 tablet (250 mg) once daily on Days 2 through 5.  Dispense: 6 each; Refill: 0 - HYDROcodone-homatropine (HYDROMET) 5-1.5 MG/5ML syrup; Take 5 mLs by mouth every 6 (six) hours as needed for cough.  Dispense: 120 mL; Refill: 0    [name], FNP

## 2018-08-19 ENCOUNTER — Other Ambulatory Visit: Payer: Self-pay | Admitting: Internal Medicine

## 2018-08-19 MED FILL — KOMBIGLYZE XR 5-1,000 MG TA: 5-1000 | 30 days supply | Qty: 30 | Fill #0

## 2018-08-19 MED FILL — FENOFIBRATE 150 MG CAPSULE: 150 | 30 days supply | Qty: 30 | Fill #2

## 2018-09-13 ENCOUNTER — Encounter: Payer: Self-pay | Admitting: Internal Medicine

## 2018-09-13 ENCOUNTER — Ambulatory Visit: Payer: 59 | Admitting: Internal Medicine

## 2018-09-13 VITALS — BP 134/80 | HR 80 | Temp 98.3°F | Ht 72.0 in | Wt 190.2 lb

## 2018-09-13 DIAGNOSIS — R102 Pelvic and perineal pain: Secondary | ICD-10-CM | POA: Diagnosis not present

## 2018-09-13 DIAGNOSIS — Z1211 Encounter for screening for malignant neoplasm of colon: Secondary | ICD-10-CM

## 2018-09-13 DIAGNOSIS — E1165 Type 2 diabetes mellitus with hyperglycemia: Secondary | ICD-10-CM | POA: Diagnosis not present

## 2018-09-13 DIAGNOSIS — N50812 Left testicular pain: Secondary | ICD-10-CM

## 2018-09-13 DIAGNOSIS — I10 Essential (primary) hypertension: Secondary | ICD-10-CM

## 2018-09-13 DIAGNOSIS — Z125 Encounter for screening for malignant neoplasm of prostate: Secondary | ICD-10-CM | POA: Diagnosis not present

## 2018-09-13 LAB — POCT URINALYSIS DIPSTICK
Bilirubin, UA: NEGATIVE
Blood, UA: NEGATIVE
Glucose, UA: NEGATIVE
Ketones, UA: NEGATIVE
LEUKOCYTES UA: NEGATIVE
Nitrite, UA: NEGATIVE
Protein, UA: NEGATIVE
SPEC GRAV UA: 1.02 (ref 1.010–1.025)
Urobilinogen, UA: 0.2 E.U./dL
pH, UA: 6.5 (ref 5.0–8.0)

## 2018-09-13 NOTE — Progress Notes (Signed)
Subjective:     Patient ID: Adam Wiley , male    DOB: [birth year] , 43 y.o.   MRN: [id number]   Chief Complaint  Patient presents with  . Abdominal Pain    stated having pain about 5 days ago, no drainage, no vomitting, like cramping achin feeling   . Testicle Pain    HPI  Abdominal Pain  This is a chronic problem. The current episode started more than 1 year ago.  Testicle Pain  The patient's primary symptoms include testicular pain. Associated symptoms include abdominal pain.     Past Medical History:  Diagnosis Date  . Diabetes mellitus without complication ([place])   . Hypertension      Family History  Problem Relation Age of Onset  . Diabetes Mother   . Diabetes Father      Current Outpatient Medications:  .  cyclobenzaprine (FLEXERIL) 10 MG tablet, Take 1 tablet (10 mg total) by mouth at bedtime as needed for muscle spasms., Disp: 30 tablet, Rfl: 0 .  Fenofibrate 150 MG CAPS, fenofibrate 150 mg capsule, Disp: , Rfl:  .  fexofenadine (ALLEGRA) 180 MG tablet, Take 180 mg by mouth daily., Disp: , Rfl:  .  KOMBIGLYZE XR 11-998 MG TB24, TAKE 1 TABLET BY MOUTH EVERY DAY WITH THE EVENING MEAL, Disp: 30 tablet, Rfl: 5 .  lisinopril-hydrochlorothiazide (PRINZIDE,ZESTORETIC) 20-12.5 MG tablet, lisinopril 20 mg-hydrochlorothiazide 12.5 mg tablet  Take 1 tablet every day by oral route., Disp: , Rfl:    No Known Allergies   Review of Systems  Constitutional: Negative.   Respiratory: Negative.   Cardiovascular: Negative.   Gastrointestinal: Positive for abdominal pain.  Genitourinary: Positive for testicular pain.  Neurological: Negative.   Psychiatric/Behavioral: Negative.      Today's Vitals   09/13/18 0903  BP: 134/80  Pulse: 80  Temp: 98.3 F (36.8 C)  TempSrc: Oral  SpO2: 96%  Weight: 190 lb 3.2 oz (86.3 kg)  Height: 6' (1.829 m)   Body mass index is 25.8 kg/m.   Objective:  Physical Exam Vitals signs and nursing note reviewed.  Constitutional:    Appearance: Normal appearance.  Cardiovascular:     Rate and Rhythm: Normal rate and regular rhythm.     Heart sounds: Normal heart sounds.  Pulmonary:     Effort: Pulmonary effort is normal.     Breath sounds: Normal breath sounds.  Abdominal:     General: Abdomen is flat. Bowel sounds are normal.     Palpations: Abdomen is soft.     Tenderness: There is abdominal tenderness in the right lower quadrant and left lower quadrant.  Genitourinary:    Penis: Normal.      Scrotum/Testes: Cremasteric reflex is present.     Comments: No testicular tenderness to palpation Skin:    General: Skin is warm.  Neurological:     General: No focal deficit present.     Mental Status: He is alert.  Psychiatric:        Mood and Affect: Mood normal.         Assessment And Plan:     1. Pelvic and perineal pain  I think this is more of a muscular issue. He may take aleve prn.   2.  Uncontrolled type 2 diabetes mellitus with hyperglycemia ([place])  I will check labs as listed below. I will make med adjustments as needed.   - CMP14+EGFR - Hemoglobin A1c  3. Essential hypertension, benign  Controlled. He will continue with current  meds. He is encouraged to avoid adding salt to his foods.   4. Testicular pain, left  He has had similar sx in the past along with Urology evaluation. I will refer him back to Urology should his symptoms persist.   - Chlamydia/Gonococcus/Trichomonas, NAA  5. Screen for colon cancer  - Ambulatory referral to Gastroenterology  6. Screening for prostate cancer  DRE deferred until physical exam  - PSA        [name], MD

## 2018-09-13 NOTE — Patient Instructions (Signed)
Testicular Self-Exam  A self-examination of your testicles (testicular self-exam) involves looking at and feeling your testicles for abnormal lumps or swelling. Several things can cause swelling, lumps, or pain in your testicles. Some of these causes are:  · Injuries.  · Inflammation.  · Infection.  · Buildup of fluids around your testicle (hydrocele).  · Twisted testicles (testicular torsion).  · Testicular cancer.  Why is it important to do a testicular self-exam?  Self-examination of the testicles and the left and right groin areas may be recommended if you are at risk for testicular cancer. Your groin is where your lower abdomen meets your upper thighs. You may be at risk for testicular cancer if you have:  · An undescended testicle (cryptorchidism).  · A history of previous testicular cancer.  · A family history of testicular cancer.  How to do a testicular self-exam  The testicles are easiest to examine after a warm bath or shower. They are more difficult to examine when you are cold. This is because the muscles attached to the testicles retract and pull them up higher or into the abdomen.  A normal testicle is egg-shaped and feels firm. It is smooth and not tender. The spermatic cord can be felt as a firm, spaghetti-like cord at the back of your testicle.  Look and feel for changes  · Stand and hold your penis away from your body.  · Look at each testicle to check for lumps or swelling.  · Roll each testicle between your thumb and forefinger, feeling the entire testicle. Feel for:  ? Lumps.  ? Swelling.  ? Discomfort.  · Check the groin area between your abdomen and upper thighs on both sides of your body. Look and feel for any swelling or bumps that are tender. These could be enlarged lymph nodes.  Contact a health care provider if:  · You find any bumps or lumps, such as a small, hard, pea-sized lump.  · You find swelling, pain, or soreness.  · You see or feel any other changes in your  testicles.  Summary  · A self-examination of your testicles (testicular self-exam) involves looking at and feeling your testicles for any changes.  · Self-examination of the testicles and the left and right groin areas may be recommended if you are at risk for testicular cancer.  · You should check each of your testicles for lumps, swelling, or discomfort.  · You should check for swelling or tender bumps in your groin area between your lower abdomen and upper thighs.  This information is not intended to replace advice given to you by your health care provider. Make sure you discuss any questions you have with your health care provider.  Document Released: 10/05/2000 Document Revised: 05/25/2016 Document Reviewed: 05/25/2016  Elsevier Interactive Patient Education © 2019 [employer]

## 2018-09-14 LAB — CMP14+EGFR
ALK PHOS: 54 IU/L (ref 39–117)
ALT: 24 IU/L (ref 0–44)
AST: 20 IU/L (ref 0–40)
Albumin/Globulin Ratio: 2 (ref 1.2–2.2)
Albumin: 4.9 g/dL (ref 4.0–5.0)
BUN / CREAT RATIO: 10 (ref 9–20)
BUN: 16 mg/dL (ref 6–24)
Bilirubin Total: 0.3 mg/dL (ref 0.0–1.2)
CO2: 22 mmol/L (ref 20–29)
CREATININE: 1.59 mg/dL — AB (ref 0.76–1.27)
Calcium: 9.9 mg/dL (ref 8.7–10.2)
Chloride: 103 mmol/L (ref 96–106)
GFR, EST AFRICAN AMERICAN: 60 mL/min/{1.73_m2} (ref >59)
GFR, EST NON AFRICAN AMERICAN: 52 mL/min/{1.73_m2} — AB (ref >59)
GLOBULIN, TOTAL: 2.4 g/dL (ref 1.5–4.5)
Glucose: 124 mg/dL — ABNORMAL HIGH (ref 65–99)
Potassium: 4.3 mmol/L (ref 3.5–5.2)
SODIUM: 139 mmol/L (ref 134–144)
Total Protein: 7.3 g/dL (ref 6.0–8.5)

## 2018-09-14 LAB — PSA: Prostate Specific Ag, Serum: 1 ng/mL (ref 0.0–4.0)

## 2018-09-14 LAB — HEMOGLOBIN A1C
Est. average glucose Bld gHb Est-mCnc: 157 mg/dL
HEMOGLOBIN A1C: 7.1 % — AB (ref 4.8–5.6)

## 2018-09-15 LAB — CHLAMYDIA/GONOCOCCUS/TRICHOMONAS, NAA
CHLAMYDIA BY NAA: NEGATIVE
GONOCOCCUS BY NAA: NEGATIVE
Trich vag by NAA: NEGATIVE

## 2018-09-20 ENCOUNTER — Other Ambulatory Visit: Payer: Self-pay

## 2018-09-20 DIAGNOSIS — R102 Pelvic and perineal pain: Secondary | ICD-10-CM

## 2018-09-20 DIAGNOSIS — N50812 Left testicular pain: Secondary | ICD-10-CM

## 2018-09-22 MED FILL — FENOFIBRATE 150 MG CAPSULE: 150 | 30 days supply | Qty: 30 | Fill #3 | Status: TO

## 2018-09-22 MED FILL — KOMBIGLYZE XR 5-1,000 MG TA: 5-1000 | 30 days supply | Qty: 30 | Fill #1 | Status: TO

## 2018-09-27 ENCOUNTER — Ambulatory Visit: Payer: 59 | Admitting: Internal Medicine

## 2018-09-28 ENCOUNTER — Other Ambulatory Visit: Payer: Self-pay

## 2018-09-28 DIAGNOSIS — R1032 Left lower quadrant pain: Secondary | ICD-10-CM | POA: Diagnosis not present

## 2018-09-28 DIAGNOSIS — R1031 Right lower quadrant pain: Secondary | ICD-10-CM | POA: Diagnosis not present

## 2018-09-28 MED ORDER — TRAMADOL HCL 50 MG PO TABS: 50.0000 mg | ORAL_TABLET | Freq: Four times a day (QID) | ORAL | 0 refills | Status: DC | PRN

## 2018-09-28 MED FILL — METHYLPREDNISOLONE 4 MG TAB: 4 | 6 days supply | Qty: 21 | Fill #0

## 2018-09-28 MED FILL — traMADol HCL 50 MG TABS: 50 | 8 days supply | Qty: 20 | Fill #0

## 2018-10-04 ENCOUNTER — Ambulatory Visit: Payer: 59 | Admitting: Internal Medicine

## 2018-10-17 ENCOUNTER — Other Ambulatory Visit: Payer: Self-pay

## 2018-10-17 MED ORDER — ESOMEPRAZOLE MAGNESIUM 40 MG PO CPDR: DELAYED_RELEASE_CAPSULE | ORAL | 1 refills | Status: DC

## 2018-10-17 MED FILL — ESOMEPRAZOLE MAG DR 40 MG C: 40 | 90 days supply | Qty: 90 | Fill #0

## 2018-10-21 MED FILL — FENOFIBRATE 150 MG CAPSULE: 150 | 30 days supply | Qty: 30 | Fill #0

## 2018-10-21 MED FILL — KOMBIGLYZE XR 5-1,000 MG TA: 5-1000 | 30 days supply | Qty: 30 | Fill #0

## 2018-10-21 MED FILL — LISINOPRIL-HCTZ 20-12.5 MG: 20-12.5 | 90 days supply | Qty: 90 | Fill #0

## 2018-11-29 MED FILL — FENOFIBRATE 150 MG CAPSULE: 150 | 30 days supply | Qty: 30 | Fill #1

## 2018-11-29 MED FILL — KOMBIGLYZE XR 5-1,000 MG TA: 5-1000 | 30 days supply | Qty: 30 | Fill #1

## 2018-12-14 ENCOUNTER — Ambulatory Visit: Payer: 59 | Admitting: Internal Medicine

## 2018-12-26 ENCOUNTER — Ambulatory Visit: Payer: 59 | Admitting: Internal Medicine

## 2018-12-26 ENCOUNTER — Other Ambulatory Visit: Payer: Self-pay

## 2018-12-26 ENCOUNTER — Encounter: Payer: Self-pay | Admitting: Internal Medicine

## 2018-12-26 VITALS — BP 124/70 | HR 71 | Temp 98.4°F | Ht 72.0 in | Wt 190.8 lb

## 2018-12-26 DIAGNOSIS — E1165 Type 2 diabetes mellitus with hyperglycemia: Secondary | ICD-10-CM

## 2018-12-26 DIAGNOSIS — M25531 Pain in right wrist: Secondary | ICD-10-CM | POA: Diagnosis not present

## 2018-12-26 DIAGNOSIS — W11XXXA Fall on and from ladder, initial encounter: Secondary | ICD-10-CM | POA: Diagnosis not present

## 2018-12-26 DIAGNOSIS — I10 Essential (primary) hypertension: Secondary | ICD-10-CM | POA: Diagnosis not present

## 2018-12-26 NOTE — Patient Instructions (Signed)
Diabetes Mellitus and Foot Care  Foot care is an important part of your health, especially when you have diabetes. Diabetes may cause you to have problems because of poor blood flow (circulation) to your feet and legs, which can cause your skin to:   Become thinner and drier.   Break more easily.   Heal more slowly.   Peel and crack.  You may also have nerve damage (neuropathy) in your legs and feet, causing decreased feeling in them. This means that you may not notice minor injuries to your feet that could lead to more serious problems. Noticing and addressing any potential problems early is the best way to prevent future foot problems.  How to care for your feet  Foot hygiene   Wash your feet daily with warm water and mild soap. Do not use hot water. Then, pat your feet and the areas between your toes until they are completely dry. Do not soak your feet as this can dry your skin.   Trim your toenails straight across. Do not dig under them or around the cuticle. File the edges of your nails with an emery board or nail file.   Apply a moisturizing lotion or petroleum jelly to the skin on your feet and to dry, brittle toenails. Use lotion that does not contain alcohol and is unscented. Do not apply lotion between your toes.  Shoes and socks   Wear clean socks or stockings every day. Make sure they are not too tight. Do not wear knee-high stockings since they may decrease blood flow to your legs.   Wear shoes that fit properly and have enough cushioning. Always look in your shoes before you put them on to be sure there are no objects inside.   To break in new shoes, wear them for just a few hours a day. This prevents injuries on your feet.  Wounds, scrapes, corns, and calluses   Check your feet daily for blisters, cuts, bruises, sores, and redness. If you cannot see the bottom of your feet, use a mirror or ask someone for help.   Do not cut corns or calluses or try to remove them with medicine.   If you  find a minor scrape, cut, or break in the skin on your feet, keep it and the skin around it clean and dry. You may clean these areas with mild soap and water. Do not clean the area with peroxide, alcohol, or iodine.   If you have a wound, scrape, corn, or callus on your foot, look at it several times a day to make sure it is healing and not infected. Check for:  ? Redness, swelling, or pain.  ? Fluid or blood.  ? Warmth.  ? Pus or a bad smell.  General instructions   Do not cross your legs. This may decrease blood flow to your feet.   Do not use heating pads or hot water bottles on your feet. They may burn your skin. If you have lost feeling in your feet or legs, you may not know this is happening until it is too late.   Protect your feet from hot and cold by wearing shoes, such as at the beach or on hot pavement.   Schedule a complete foot exam at least once a year (annually) or more often if you have foot problems. If you have foot problems, report any cuts, sores, or bruises to your health care provider immediately.  Contact a health care provider if:     You have a medical condition that increases your risk of infection and you have any cuts, sores, or bruises on your feet.   You have an injury that is not healing.   You have redness on your legs or feet.   You feel burning or tingling in your legs or feet.   You have pain or cramps in your legs and feet.   Your legs or feet are numb.   Your feet always feel cold.   You have pain around a toenail.  Get help right away if:   You have a wound, scrape, corn, or callus on your foot and:  ? You have pain, swelling, or redness that gets worse.  ? You have fluid or blood coming from the wound, scrape, corn, or callus.  ? Your wound, scrape, corn, or callus feels warm to the touch.  ? You have pus or a bad smell coming from the wound, scrape, corn, or callus.  ? You have a fever.  ? You have a red line going up your leg.  Summary   Check your feet every day  for cuts, sores, red spots, swelling, and blisters.   Moisturize feet and legs daily.   Wear shoes that fit properly and have enough cushioning.   If you have foot problems, report any cuts, sores, or bruises to your health care provider immediately.   Schedule a complete foot exam at least once a year (annually) or more often if you have foot problems.  This information is not intended to replace advice given to you by your health care provider. Make sure you discuss any questions you have with your health care provider.  Document Released: 06/26/2000 Document Revised: 08/11/2017 Document Reviewed: 07/31/2016  Elsevier Interactive Patient Education  2019 [employer] Inc[employer]

## 2018-12-26 NOTE — Progress Notes (Signed)
Subjective:     Patient ID: Adam Wiley , male    DOB: Sep 28, 1975 , 43 y.o.   MRN: [id number]   Chief Complaint  Patient presents with  . Diabetes  . Hypertension    HPI  Diabetes He presents for his follow-up diabetic visit. He has type 2 diabetes mellitus. His disease course has been improving. There are no hypoglycemic associated symptoms. Pertinent negatives for diabetes include no blurred vision and no chest pain. There are no hypoglycemic complications. Risk factors for coronary artery disease include diabetes mellitus, hypertension and male sex. He is compliant with treatment most of the time. He is following a diabetic diet. He participates in exercise three times a week. His home blood glucose trend is fluctuating minimally. His breakfast blood glucose is taken between 7-8 am. His breakfast blood glucose range is generally 110-130 mg/dl. An ACE inhibitor/angiotensin II receptor blocker is being taken.  Hypertension This is a chronic problem. The current episode started more than 1 year ago. The problem has been gradually improving since onset. The problem is controlled. Pertinent negatives include no blurred vision or chest pain. Past treatments include ACE inhibitors and diuretics. The current treatment provides moderate improvement.     Past Medical History:  Diagnosis Date  . Diabetes mellitus without complication ([place])   . Hypertension      Family History  Problem Relation Age of Onset  . Diabetes Mother   . Diabetes Father      Current Outpatient Medications:  .  esomeprazole (NEXIUM) 40 MG capsule, Take 1 capsule by mouth daily, Disp: 90 capsule, Rfl: 1 .  Fenofibrate 150 MG CAPS, fenofibrate 150 mg capsule, Disp: , Rfl:  .  fexofenadine (ALLEGRA) 180 MG tablet, Take 180 mg by mouth daily., Disp: , Rfl:  .  KOMBIGLYZE XR 11-998 MG TB24, TAKE 1 TABLET BY MOUTH EVERY DAY WITH THE EVENING MEAL, Disp: 30 tablet, Rfl: 5 .  lisinopril-hydrochlorothiazide  (PRINZIDE,ZESTORETIC) 20-12.5 MG tablet, lisinopril 20 mg-hydrochlorothiazide 12.5 mg tablet  Take 1 tablet every day by oral route., Disp: , Rfl:  .  traMADol (ULTRAM) 50 MG tablet, Take 1 tablet (50 mg total) by mouth every 6 (six) hours as needed. (Patient not taking: Reported on 12/26/2018), Disp: 20 tablet, Rfl: 0   No Known Allergies   Review of Systems  Constitutional: Negative.   Eyes: Negative for blurred vision.  Respiratory: Negative.   Cardiovascular: Negative.  Negative for chest pain.  Gastrointestinal: Negative.   Musculoskeletal: Positive for arthralgias.       He c/o R wrist pain. He reports he fell off of an 8 foot ladder yesterday. He was not at the top. He was pressure washing his home. He turned on the hose and the power from the hose threw him backwards. He fell onto his right wrist. He has not taken anything. There is some pain with movement, but he does have full mobility.   Neurological: Negative.   Psychiatric/Behavioral: Negative.      Today's Vitals   12/26/18 1134  BP: 124/70  Pulse: 71  Temp: 98.4 F (36.9 C)  TempSrc: Oral  Weight: 190 lb 12.8 oz (86.5 kg)  Height: 6' (1.829 m)   Body mass index is 25.88 kg/m.   Objective:  Physical Exam Vitals signs and nursing note reviewed.  Constitutional:      Appearance: Normal appearance.  Cardiovascular:     Rate and Rhythm: Normal rate and regular rhythm.     Heart sounds: Normal  heart sounds.  Pulmonary:     Effort: Pulmonary effort is normal.     Breath sounds: Normal breath sounds.  Musculoskeletal:     Comments: Right wrist - has full ROM, no overlying erythema Sl tenderness to palpation  Skin:    General: Skin is warm.  Neurological:     General: No focal deficit present.     Mental Status: He is alert.  Psychiatric:        Mood and Affect: Mood normal.         Assessment And Plan:     1. Uncontrolled type 2 diabetes mellitus with hyperglycemia ([place])  I will check labs as listed  below. He will rto in 4 months for re-evaluation. Importance of medication AND exercise AND dietary compliance was discussed with the patient.   - BMP8+EGFR - Hemoglobin A1c - Lipid panel  2. Essential hypertension, benign  Well controlled. He will continue with current meds. He is encouraged to avoid adding salt to his foods.   3. Right wrist pain  Likely due to sprain. He will let me know if his sx persist and warrant further evaluation.   4. Fall from ladder, initial encounter  Occurred on 12/25/2018.   [name], MD    THE PATIENT IS ENCOURAGED TO PRACTICE SOCIAL DISTANCING DUE TO THE COVID-19 PANDEMIC.

## 2018-12-27 LAB — LIPID PANEL
Chol/HDL Ratio: 4.9 ratio (ref 0.0–5.0)
Cholesterol, Total: 192 mg/dL (ref 100–199)
HDL: 39 mg/dL — ABNORMAL LOW (ref >39)
LDL Calculated: 106 mg/dL — ABNORMAL HIGH (ref 0–99)
Triglycerides: 237 mg/dL — ABNORMAL HIGH (ref 0–149)
VLDL Cholesterol Cal: 47 mg/dL — ABNORMAL HIGH (ref 5–40)

## 2018-12-27 LAB — BMP8+EGFR
BUN/Creatinine Ratio: 12 (ref 9–20)
BUN: 18 mg/dL (ref 6–24)
CO2: 23 mmol/L (ref 20–29)
Calcium: 9.8 mg/dL (ref 8.7–10.2)
Chloride: 106 mmol/L (ref 96–106)
Creatinine, Ser: 1.54 mg/dL — ABNORMAL HIGH (ref 0.76–1.27)
GFR calc Af Amer: 62 mL/min/{1.73_m2} (ref >59)
GFR calc non Af Amer: 54 mL/min/{1.73_m2} — ABNORMAL LOW (ref >59)
Glucose: 186 mg/dL — ABNORMAL HIGH (ref 65–99)
Potassium: 4.4 mmol/L (ref 3.5–5.2)
Sodium: 143 mmol/L (ref 134–144)

## 2018-12-27 LAB — HEMOGLOBIN A1C
Est. average glucose Bld gHb Est-mCnc: 197 mg/dL
Hgb A1c MFr Bld: 8.5 % — ABNORMAL HIGH (ref 4.8–5.6)

## 2018-12-30 ENCOUNTER — Other Ambulatory Visit: Payer: Self-pay | Admitting: Internal Medicine

## 2018-12-30 MED FILL — KOMBIGLYZE XR 5-1,000 MG TA: 5-1000 | 30 days supply | Qty: 30 | Fill #2

## 2018-12-30 MED FILL — FENOFIBRATE 150 MG CAPSULE: 150 | 90 days supply | Qty: 90 | Fill #0

## 2019-01-22 MED FILL — LISINOPRIL-HCTZ 20-12.5 MG: 20-12.5 | 90 days supply | Qty: 90 | Fill #1

## 2019-02-03 MED FILL — KOMBIGLYZE XR 5-1,000 MG TA: 5-1000 | 30 days supply | Qty: 30 | Fill #0

## 2019-02-22 ENCOUNTER — Other Ambulatory Visit: Payer: Self-pay | Admitting: Internal Medicine

## 2019-02-22 MED FILL — buPROPion HCL ER (XL) 150 M: 150 | 90 days supply | Qty: 90 | Fill #0

## 2019-03-08 ENCOUNTER — Other Ambulatory Visit: Payer: Self-pay | Admitting: Internal Medicine

## 2019-03-08 MED FILL — KOMBIGLYZE XR 5-1,000 MG TA: 5-1000 | 30 days supply | Qty: 30 | Fill #0

## 2019-04-03 ENCOUNTER — Ambulatory Visit: Payer: 59 | Admitting: Internal Medicine

## 2019-04-03 ENCOUNTER — Encounter: Payer: Self-pay | Admitting: Internal Medicine

## 2019-04-03 ENCOUNTER — Other Ambulatory Visit: Payer: Self-pay

## 2019-04-03 VITALS — BP 140/82 | HR 70 | Temp 98.2°F | Ht 70.8 in | Wt 189.6 lb

## 2019-04-03 DIAGNOSIS — I10 Essential (primary) hypertension: Secondary | ICD-10-CM

## 2019-04-03 DIAGNOSIS — Z716 Tobacco abuse counseling: Secondary | ICD-10-CM | POA: Diagnosis not present

## 2019-04-03 DIAGNOSIS — Z23 Encounter for immunization: Secondary | ICD-10-CM

## 2019-04-03 DIAGNOSIS — E1165 Type 2 diabetes mellitus with hyperglycemia: Secondary | ICD-10-CM | POA: Diagnosis not present

## 2019-04-03 MED ORDER — CHANTIX STARTING MONTH PAK 0.5 MG X 11 & 1 MG X 42 PO TABS: ORAL_TABLET | ORAL | 0 refills | Status: DC

## 2019-04-03 NOTE — Patient Instructions (Signed)
Tobacco Use Disorder Tobacco use disorder (TUD) occurs when a person craves, seeks, and uses tobacco, regardless of the consequences. This disorder can cause problems with mental and physical health. It can affect your ability to have healthy relationships, and it can keep you from meeting your responsibilities at work, home, or school. Tobacco may be:  Smoked as a cigarette or cigar.  Inhaled using e-cigarettes.  Smoked in a pipe or hookah.  Chewed as smokeless tobacco.  Inhaled into the nostrils as snuff. Tobacco products contain a dangerous chemical called nicotine, which is very addictive. Nicotine triggers hormones that make the body feel stimulated and works on areas of the brain that make you feel good. These effects can make it hard for people to quit nicotine. Tobacco contains many other unsafe chemicals that can damage almost every organ in the body. Smoking tobacco also puts others in danger due to fire risk and possible health problems caused by breathing in secondhand smoke. What are the signs or symptoms? Symptoms of TUD may include:  Being unable to slow down or stop your tobacco use.  Spending an abnormal amount of time getting or using tobacco.  Craving tobacco. Cravings may last for up to 6 months after quitting.  Tobacco use that: ? Interferes with your work, school, or home life. ? Interferes with your personal and social relationships. ? Makes you give up activities that you once enjoyed or found important.  Using tobacco even though you know that it is: ? Dangerous or bad for your health or someone else's health. ? Causing problems in your life.  Needing more and more of the substance to get the same effect (developing tolerance).  Experiencing unpleasant symptoms if you do not use the substance (withdrawal). Withdrawal symptoms may include: ? Depressed, anxious, or irritable mood. ? Difficulty concentrating. ? Increased appetite. ? Restlessness or trouble  sleeping.  Using the substance to avoid withdrawal. How is this diagnosed? This condition may be diagnosed based on:  Your current and past tobacco use. Your health care provider may ask questions about how your tobacco use affects your life.  A physical exam. You may be diagnosed with TUD if you have at least two symptoms within a 12-month period. How is this treated? This condition is treated by stopping tobacco use. Many people are unable to quit on their own and need help. Treatment may include:  Nicotine replacement therapy (NRT). NRT provides nicotine without the other harmful chemicals in tobacco. NRT gradually lowers the dosage of nicotine in the body and reduces withdrawal symptoms. NRT is available as: ? Over-the-counter gums, lozenges, and skin patches. ? Prescription mouth inhalers and nasal sprays.  Medicine that acts on the brain to reduce cravings and withdrawal symptoms.  A type of talk therapy that examines your triggers for tobacco use, how to avoid them, and how to cope with cravings (behavioral therapy).  Hypnosis. This may help with withdrawal symptoms.  Joining a support group for others coping with TUD. The best treatment for TUD is usually a combination of medicine, talk therapy, and support groups. Recovery can be a long process. Many people start using tobacco again after stopping (relapse). If you relapse, it does not mean that treatment will not work. Follow these instructions at home:  Lifestyle  Do not use any products that contain nicotine or tobacco, such as cigarettes and e-cigarettes.  Avoid things that trigger tobacco use as much as you can. Triggers include people and situations that usually cause you   to use tobacco.  Avoid drinks that contain caffeine, including coffee. These may worsen some withdrawal symptoms.  Find ways to manage stress. Wanting to smoke may cause stress, and stress can make you want to smoke. Relaxation techniques such as  deep breathing, meditation, and yoga may help.  Attend support groups as needed. These groups are an important part of long-term recovery for many people. General instructions  Take over-the-counter and prescription medicines only as told by your health care provider.  Check with your health care provider before taking any new prescription or over-the-counter medicines.  Decide on a friend, family member, or smoking quit-line (such as 1-800-QUIT-NOW in the [place] that you can call or text when you feel the urge to smoke or when you need help coping with cravings.  Keep all follow-up visits as told by your health care provider and therapist. This is important. Contact a health care provider if:  You are not able to take your medicines as prescribed.  Your symptoms get worse, even with treatment. Summary  Tobacco use disorder (TUD) occurs when a person craves, seeks, and uses tobacco regardless of the consequences.  This condition may be diagnosed based on your current and past tobacco use and a physical exam.  Many people are unable to quit on their own and need help. Recovery can be a long process.  The most effective treatment for TUD is usually a combination of medicine, talk therapy, and support groups. This information is not intended to replace advice given to you by your health care provider. Make sure you discuss any questions you have with your health care provider. Document Released: 03/04/2004 Document Revised: 06/16/2017 Document Reviewed: 06/16/2017 Elsevier Patient Education  2020 Elsevier Inc.  

## 2019-04-03 NOTE — Progress Notes (Signed)
Subjective:     Patient ID: Adam Wiley , male    DOB: 1975/11/30 , 43 y.o.   MRN: [id number]   Chief Complaint  Patient presents with  . Diabetes  . Hypertension    HPI  He is here today for dm check. Reports he feels good. Thinks BS are better.  He reports having eye exam earlier this year.   Diabetes He presents for his follow-up diabetic visit. He has type 2 diabetes mellitus. His disease course has been improving. There are no hypoglycemic associated symptoms. Pertinent negatives for diabetes include no blurred vision and no chest pain. There are no hypoglycemic complications. Risk factors for coronary artery disease include diabetes mellitus, hypertension and male sex. He is compliant with treatment most of the time. He is following a diabetic diet. He participates in exercise three times a week. His home blood glucose trend is fluctuating minimally. His breakfast blood glucose is taken between 7-8 am. His breakfast blood glucose range is generally 110-130 mg/dl. An ACE inhibitor/angiotensin II receptor blocker is being taken.  Hypertension This is a chronic problem. The current episode started more than 1 year ago. The problem has been gradually improving since onset. The problem is controlled. Pertinent negatives include no blurred vision or chest pain. Past treatments include ACE inhibitors and diuretics. The current treatment provides moderate improvement.     Past Medical History:  Diagnosis Date  . Diabetes mellitus without complication ([place])   . Hypertension      Family History  Problem Relation Age of Onset  . Diabetes Mother   . Diabetes Father      Current Outpatient Medications:  .  esomeprazole (NEXIUM) 40 MG capsule, Take 1 capsule by mouth daily, Disp: 90 capsule, Rfl: 1 .  Fenofibrate 150 MG CAPS, TAKE 1 CAPSULE BY MOUTH DAILY WITH FOOD, Disp: 90 capsule, Rfl: 1 .  fexofenadine (ALLEGRA) 180 MG tablet, Take 180 mg by mouth daily., Disp: , Rfl:  .   KOMBIGLYZE XR 11-998 MG TB24, TAKE 1 TABLET BY MOUTH DAILY WITH EVENING MEAL, Disp: 30 tablet, Rfl: 0 .  lisinopril-hydrochlorothiazide (PRINZIDE,ZESTORETIC) 20-12.5 MG tablet, lisinopril 20 mg-hydrochlorothiazide 12.5 mg tablet  Take 1 tablet every day by oral route., Disp: , Rfl:  .  traMADol (ULTRAM) 50 MG tablet, Take 1 tablet (50 mg total) by mouth every 6 (six) hours as needed. (Patient not taking: Reported on 12/26/2018), Disp: 20 tablet, Rfl: 0 .  varenicline (CHANTIX STARTING MONTH PAK) 0.5 MG X 11 & 1 MG X 42 tablet, Take one 0.5 mg tablet by mouth once daily for 3 days, then increase to one 0.5 mg tablet twice daily for 4 days, then increase to one 1 mg tablet twice daily., Disp: 53 tablet, Rfl: 0   No Known Allergies   Review of Systems  Constitutional: Negative.   Eyes: Negative for blurred vision.  Respiratory: Negative.   Cardiovascular: Negative.  Negative for chest pain.  Gastrointestinal: Negative.   Neurological: Negative.   Psychiatric/Behavioral: Negative.        Admits he has picked up smoking again. Wants to start something to help him quit. His wife was recently diagnosed with breast cancer.      Today's Vitals   04/03/19 0927  BP: 140/82  Pulse: 70  Temp: 98.2 F (36.8 C)  TempSrc: Oral  SpO2: 98%  Weight: 189 lb 9.6 oz (86 kg)  Height: 5' 10.8" (1.798 m)   Body mass index is 26.59 kg/m.  Objective:  Physical Exam Vitals signs and nursing note reviewed.  Constitutional:      Appearance: Normal appearance.  Cardiovascular:     Rate and Rhythm: Normal rate and regular rhythm.     Heart sounds: Normal heart sounds.  Pulmonary:     Effort: Pulmonary effort is normal.     Breath sounds: Normal breath sounds.  Skin:    General: Skin is warm.  Neurological:     General: No focal deficit present.     Mental Status: He is alert.  Psychiatric:        Mood and Affect: Mood normal.         Assessment And Plan:     1. Uncontrolled type 2 diabetes  mellitus with hyperglycemia ([place])  I will check labs as listed below. Importance of dietary and medication compliance was discussed with the patient. I will also request most recent eye exam from his ophthalmologist.   - Hemoglobin A1c - CMP14+EGFR  2. Essential hypertension, benign  Chronic, fair control. He will continue with current meds for now. Again, compliance was stressed to the patient.   3. Need for influenza vaccination  - Flu Vaccine QUAD 6+ mos PF IM (Fluarix Quad PF)  4. Encounter for tobacco use cessation counseling  We discussed importance of smoking cessation. He has tried Wellbutrin XL in the past without success. We discussed the use of Chantix in assisting with cessation efforts. Possible side effects were discussed with the patient.  I will send rx Starter Pack [place] to the pharmacy. He agrees to rto in four weeks for re-evaluation.   [name], MD    THE PATIENT IS ENCOURAGED TO PRACTICE SOCIAL DISTANCING DUE TO THE COVID-19 PANDEMIC.

## 2019-04-04 LAB — CMP14+EGFR
ALT: 29 IU/L (ref 0–44)
AST: 29 IU/L (ref 0–40)
Albumin/Globulin Ratio: 2.1 (ref 1.2–2.2)
Albumin: 4.6 g/dL (ref 4.0–5.0)
Alkaline Phosphatase: 69 IU/L (ref 39–117)
BUN/Creatinine Ratio: 9 (ref 9–20)
BUN: 14 mg/dL (ref 6–24)
Bilirubin Total: 0.2 mg/dL (ref 0.0–1.2)
CO2: 25 mmol/L (ref 20–29)
Calcium: 10.2 mg/dL (ref 8.7–10.2)
Chloride: 103 mmol/L (ref 96–106)
Creatinine, Ser: 1.52 mg/dL — ABNORMAL HIGH (ref 0.76–1.27)
GFR calc Af Amer: 63 mL/min/{1.73_m2} (ref >59)
GFR calc non Af Amer: 55 mL/min/{1.73_m2} — ABNORMAL LOW (ref >59)
Globulin, Total: 2.2 g/dL (ref 1.5–4.5)
Glucose: 105 mg/dL — ABNORMAL HIGH (ref 65–99)
Potassium: 4.6 mmol/L (ref 3.5–5.2)
Sodium: 141 mmol/L (ref 134–144)
Total Protein: 6.8 g/dL (ref 6.0–8.5)

## 2019-04-04 LAB — HEMOGLOBIN A1C
Est. average glucose Bld gHb Est-mCnc: 189 mg/dL
Hgb A1c MFr Bld: 8.2 % — ABNORMAL HIGH (ref 4.8–5.6)

## 2019-04-10 ENCOUNTER — Other Ambulatory Visit: Payer: Self-pay | Admitting: Internal Medicine

## 2019-04-10 MED FILL — KOMBIGLYZE XR 5-1,000 MG TA: 5-1000 | 30 days supply | Qty: 30 | Fill #0

## 2019-04-10 MED FILL — FENOFIBRATE 150 MG CAPSULE: 150 | 30 days supply | Qty: 30 | Fill #0

## 2019-05-01 ENCOUNTER — Ambulatory Visit: Payer: 59 | Admitting: Internal Medicine

## 2019-05-01 ENCOUNTER — Other Ambulatory Visit: Payer: Self-pay | Admitting: Nurse Practitioner

## 2019-05-01 ENCOUNTER — Other Ambulatory Visit: Payer: Self-pay

## 2019-05-01 ENCOUNTER — Telehealth: Payer: Self-pay

## 2019-05-01 MED ORDER — CHANTIX STARTING MONTH PAK 0.5 MG X 11 & 1 MG X 42 PO TABS: ORAL_TABLET | ORAL | 0 refills | Status: DC

## 2019-05-01 MED FILL — CHANTIX STARTING MONTH BOX: 0.5 MG X 11 | 28 days supply | Qty: 53 | Fill #0

## 2019-05-01 MED FILL — LISINOPRIL-HCTZ 20-12.5 MG: 20-12.5 | 90 days supply | Qty: 90 | Fill #0

## 2019-05-01 NOTE — Telephone Encounter (Signed)
The pt said that he doesn't want to do an injection but can he start to cut back on the [place] when he gets his a1c down to 6 or 7.

## 2019-05-01 NOTE — Telephone Encounter (Signed)
-----   Message from Glendale Chard, MD sent at 05/01/2019  4:03 PM EDT ----- Uncontrolled sugars are the number one reason people end up on dialysis. I think it is best that we get his sugars under control prior to cutting back on meds. We can stop metformin and start insulin OR stop Kombiglyze and start once weekly Ozempic or Trulicity.   RS ----- Message ----- From: Michelle Nasuti, [place] Sent: 05/01/2019   4:00 PM EDT To: Glendale Chard, MD  That its causing people to have issues with their kidneys and that he already has ckd 2. ----- Message ----- From: Glendale Chard, MD Sent: 05/01/2019   3:10 PM EDT To: Michelle Nasuti, CMA  What is the issue with metformin?   What has he read?   RS ----- Message ----- From: Michelle Nasuti, [place] Sent: 05/01/2019   2:56 PM EDT To: Glendale Chard, MD  The pt would like to know if he can get weaned off off the [place] xr 11-998 and be switched to something else that is less potent.  The pt said that he is working on his diet and exercise and that he has seen that the metformin isn't good for people to take.

## 2019-05-08 ENCOUNTER — Other Ambulatory Visit: Payer: Self-pay | Admitting: Internal Medicine

## 2019-05-08 MED FILL — KOMBIGLYZE XR 5-1,000 MG TA: 5-1000 | 30 days supply | Qty: 30 | Fill #0

## 2019-05-08 MED FILL — FENOFIBRATE 150 MG CAPSULE: 150 | 30 days supply | Qty: 30 | Fill #1

## 2019-05-26 DIAGNOSIS — H524 Presbyopia: Secondary | ICD-10-CM | POA: Diagnosis not present

## 2019-05-26 DIAGNOSIS — E119 Type 2 diabetes mellitus without complications: Secondary | ICD-10-CM | POA: Diagnosis not present

## 2019-05-26 LAB — HM DIABETES EYE EXAM

## 2019-05-29 ENCOUNTER — Encounter: Payer: Self-pay | Admitting: Internal Medicine

## 2019-06-14 ENCOUNTER — Other Ambulatory Visit: Payer: Self-pay | Admitting: Internal Medicine

## 2019-06-14 MED FILL — FENOFIBRATE 150 MG CAPSULE: 150 | 30 days supply | Qty: 30 | Fill #2

## 2019-06-14 MED FILL — KOMBIGLYZE XR 5-1,000 MG TA: 5-1000 | 30 days supply | Qty: 30 | Fill #0

## 2019-07-04 ENCOUNTER — Encounter: Payer: 59 | Admitting: Internal Medicine

## 2019-07-13 ENCOUNTER — Other Ambulatory Visit: Payer: Self-pay | Admitting: Internal Medicine

## 2019-07-17 MED FILL — FENOFIBRATE 150 MG CAPSULE: 150 | 30 days supply | Qty: 30 | Fill #0

## 2019-07-17 MED FILL — KOMBIGLYZE XR 5-1,000 MG TA: 5-1000 | 30 days supply | Qty: 30 | Fill #0

## 2019-08-03 MED FILL — LISINOPRIL-HCTZ 20-12.5 MG: 20-12.5 | 90 days supply | Qty: 90 | Fill #1

## 2019-08-16 ENCOUNTER — Telehealth: Payer: Self-pay

## 2019-08-16 NOTE — Telephone Encounter (Signed)
I returned the pt's call and he said that he was calling to schedule an appt and that he scheduled an appt earlier today.

## 2019-08-24 ENCOUNTER — Other Ambulatory Visit: Payer: Self-pay

## 2019-08-24 ENCOUNTER — Encounter: Payer: 59 | Admitting: Internal Medicine

## 2019-08-24 MED ORDER — FENOFIBRATE 150 MG PO CAPS: ORAL_CAPSULE | ORAL | 0 refills | Status: DC

## 2019-08-24 MED ORDER — KOMBIGLYZE XR 5-1000 MG PO TB24: ORAL_TABLET | ORAL | 0 refills | Status: DC

## 2019-08-24 MED FILL — FENOFIBRATE 150 MG CAPSULE: 150 | 30 days supply | Qty: 30 | Fill #0

## 2019-08-24 MED FILL — KOMBIGLYZE XR 5-1,000 MG TA: 5-1000 | 30 days supply | Qty: 30 | Fill #0

## 2019-08-25 ENCOUNTER — Encounter: Payer: Self-pay | Admitting: Internal Medicine

## 2019-08-25 NOTE — Progress Notes (Signed)
Immediately after being brought back, pt stated he was in a hurry and had another appt to go to. He decided to reschedule; therefore, he was not seen.

## 2019-08-29 ENCOUNTER — Encounter: Payer: Self-pay | Admitting: Internal Medicine

## 2019-08-29 ENCOUNTER — Ambulatory Visit: Payer: 59 | Admitting: Internal Medicine

## 2019-08-29 ENCOUNTER — Other Ambulatory Visit: Payer: Self-pay

## 2019-08-29 VITALS — BP 114/84 | HR 67 | Temp 98.3°F | Ht 65.0 in | Wt 189.2 lb

## 2019-08-29 DIAGNOSIS — I10 Essential (primary) hypertension: Secondary | ICD-10-CM | POA: Diagnosis not present

## 2019-08-29 DIAGNOSIS — Z23 Encounter for immunization: Secondary | ICD-10-CM | POA: Diagnosis not present

## 2019-08-29 DIAGNOSIS — E1165 Type 2 diabetes mellitus with hyperglycemia: Secondary | ICD-10-CM

## 2019-08-29 DIAGNOSIS — E6609 Other obesity due to excess calories: Secondary | ICD-10-CM

## 2019-08-29 DIAGNOSIS — Z1211 Encounter for screening for malignant neoplasm of colon: Secondary | ICD-10-CM | POA: Diagnosis not present

## 2019-08-29 DIAGNOSIS — Z6831 Body mass index (BMI) 31.0-31.9, adult: Secondary | ICD-10-CM

## 2019-08-29 NOTE — Patient Instructions (Signed)
Pneumococcal Vaccine, Polyvalent solution for injection What is this medicine? PNEUMOCOCCAL VACCINE, POLYVALENT (NEU mo KOK al vak SEEN, pol ee VEY luhnt) is a vaccine to prevent pneumococcus bacteria infection. These bacteria are a major cause of ear infections, Strep throat infections, and serious pneumonia, meningitis, or blood infections worldwide. These vaccines help the body to produce antibodies (protective substances) that help your body defend against these bacteria. This vaccine is recommended for people 2 years of age and older with health problems. It is also recommended for all adults over 50 years old. This vaccine will not treat an infection. This medicine may be used for other purposes; ask your health care provider or pharmacist if you have questions. COMMON BRAND NAME(S): Pneumovax 23 What should I tell my health care provider before I take this medicine? They need to know if you have any of these conditions:  bleeding problems  bone marrow or organ transplant  cancer, Hodgkin's disease  fever  infection  immune system problems  low platelet count in the blood  seizures  an unusual or allergic reaction to pneumococcal vaccine, diphtheria toxoid, other vaccines, latex, other medicines, foods, dyes, or preservatives  pregnant or trying to get pregnant  breast-feeding How should I use this medicine? This vaccine is for injection into a muscle or under the skin. It is given by a health care professional. A copy of Vaccine Information Statements will be given before each vaccination. Read this sheet carefully each time. The sheet may change frequently. Talk to your pediatrician regarding the use of this medicine in children. While this drug may be prescribed for children as young as 2 years of age for selected conditions, precautions do apply. Overdosage: If you think you have taken too much of this medicine contact a poison control center or emergency room at  once. NOTE: This medicine is only for you. Do not share this medicine with others. What if I miss a dose? It is important not to miss your dose. Call your doctor or health care professional if you are unable to keep an appointment. What may interact with this medicine?  medicines for cancer chemotherapy  medicines that suppress your immune function  medicines that treat or prevent blood clots like warfarin, enoxaparin, and dalteparin  steroid medicines like prednisone or cortisone This list may not describe all possible interactions. Give your health care provider a list of all the medicines, herbs, non-prescription drugs, or dietary supplements you use. Also tell them if you smoke, drink alcohol, or use illegal drugs. Some items may interact with your medicine. What should I watch for while using this medicine? Mild fever and pain should go away in 3 days or less. Report any unusual symptoms to your doctor or health care professional. What side effects may I notice from receiving this medicine? Side effects that you should report to your doctor or health care professional as soon as possible:  allergic reactions like skin rash, itching or hives, swelling of the face, lips, or tongue  breathing problems  confused  fever over 102 degrees F  pain, tingling, numbness in the hands or feet  seizures  unusual bleeding or bruising  unusual muscle weakness Side effects that usually do not require medical attention (report to your doctor or health care professional if they continue or are bothersome):  aches and pains  diarrhea  fever of 102 degrees F or less  headache  irritable  loss of appetite  pain, tender at site where injected    trouble sleeping This list may not describe all possible side effects. Call your doctor for medical advice about side effects. You may report side effects to FDA at 1-800-FDA-1088. Where should I keep my medicine? This does not apply. This  vaccine is given in a clinic, pharmacy, doctor's office, or other health care setting and will not be stored at home. NOTE: This sheet is a summary. It may not cover all possible information. If you have questions about this medicine, talk to your doctor, pharmacist, or health care provider.  2020 Elsevier/Gold Standard (2008-02-03 14:32:37)  

## 2019-08-29 NOTE — Progress Notes (Signed)
This visit occurred during the SARS-CoV-2 public health emergency.  Safety protocols were in place, including screening questions prior to the visit, additional usage of staff PPE, and extensive cleaning of exam room while observing appropriate contact time as indicated for disinfecting solutions.  Subjective:     Patient ID: Adam Wiley , male    DOB: 02/01/1976 , 44 y.o.   MRN: [id number]   Chief Complaint  Patient presents with  . Hypertension  . Diabetes    HPI  Hypertension This is a chronic problem. The current episode started more than 1 year ago. The problem has been gradually improving since onset. The problem is controlled. Pertinent negatives include no blurred vision or chest pain. Past treatments include ACE inhibitors and diuretics. The current treatment provides moderate improvement.  Diabetes He presents for his follow-up diabetic visit. He has type 2 diabetes mellitus. His disease course has been improving. There are no hypoglycemic associated symptoms. Pertinent negatives for diabetes include no blurred vision and no chest pain. There are no hypoglycemic complications. Risk factors for coronary artery disease include diabetes mellitus, hypertension and male sex. He is compliant with treatment most of the time. He is following a diabetic diet. He participates in exercise three times a week. His home blood glucose trend is fluctuating minimally. His breakfast blood glucose is taken between 7-8 am. His breakfast blood glucose range is generally 110-130 mg/dl. An ACE inhibitor/angiotensin II receptor blocker is being taken.     Past Medical History:  Diagnosis Date  . Diabetes mellitus without complication ([place])   . Hypertension      Family History  Problem Relation Age of Onset  . Diabetes Mother   . Diabetes Father      Current Outpatient Medications:  .  esomeprazole (NEXIUM) 40 MG capsule, Take 1 capsule by mouth daily, Disp: 90 capsule, Rfl: 1 .  Fenofibrate  150 MG CAPS, TAKE 1 CAPSULE BY MOUTH DAILY WITH FOOD, Disp: 30 capsule, Rfl: 0 .  fexofenadine (ALLEGRA) 180 MG tablet, Take 180 mg by mouth daily., Disp: , Rfl:  .  lisinopril-hydrochlorothiazide (ZESTORETIC) 20-12.5 MG tablet, TAKE 1 TABLET BY MOUTH ONCE DAILY, Disp: 90 tablet, Rfl: 1 .  Saxagliptin-Metformin (KOMBIGLYZE XR) 11-998 MG TB24, TAKE 1 TABLET BY MOUTH DAILY WITH EVENING MEAL, Disp: 30 tablet, Rfl: 0 .  traMADol (ULTRAM) 50 MG tablet, Take 1 tablet (50 mg total) by mouth every 6 (six) hours as needed., Disp: 20 tablet, Rfl: 0 .  varenicline (CHANTIX STARTING MONTH PAK) 0.5 MG X 11 & 1 MG X 42 tablet, Take one 0.5 mg tablet by mouth once daily for 3 days, then increase to one 0.5 mg tablet twice daily for 4 days, then increase to one 1 mg tablet twice daily. (Patient not taking: Reported on 08/29/2019), Disp: 53 tablet, Rfl: 0   No Known Allergies   Review of Systems  Constitutional: Negative.   Eyes: Negative for blurred vision.  Respiratory: Negative.   Cardiovascular: Negative.  Negative for chest pain.  Gastrointestinal: Negative.   Neurological: Negative.   Psychiatric/Behavioral: Negative.      Today's Vitals   08/29/19 1140  BP: 114/84  Pulse: 67  Temp: 98.3 F (36.8 C)  Weight: 189 lb 3.2 oz (85.8 kg)  Height: 5' 5" (1.651 m)   Body mass index is 31.48 kg/m.   Objective:  Physical Exam Vitals and nursing note reviewed.  Constitutional:      Appearance: Normal appearance.  Cardiovascular:  Rate and Rhythm: Normal rate and regular rhythm.     Heart sounds: Normal heart sounds.  Pulmonary:     Effort: Pulmonary effort is normal.     Breath sounds: Normal breath sounds.  Skin:    General: Skin is warm.  Neurological:     General: No focal deficit present.     Mental Status: He is alert.  Psychiatric:        Mood and Affect: Mood normal.         Assessment And Plan:   1. Essential hypertension, benign  Chronic, well controlled. He will  continue with current meds. He is encouraged to avoid adding salt to his foods.   2. Uncontrolled type 2 diabetes mellitus with hyperglycemia ([facility])  Chronic, I will check labs as listed below. I will make medication changes as needed. Pt reminded goal a1c is less than 7.0.  He hopes to get off of meds in the near future.   - Hemoglobin A1c - CMP14+EGFR  3. Screen for colon cancer  He agrees to GI referral for CRC screening.   - Ambulatory referral to Gastroenterology  4. Immunization due  - Pneumococcal polysaccharide vaccine 23-valent greater than or equal to 2yo subcutaneous/IM  5. Class 1 obesity due to excess calories with serious comorbidity and body mass index (BMI) of 31.0 to 31.9 in adult  He is encouraged to strive for BMI less than 27 to decrease cardiac risk.   [name], MD    THE PATIENT IS ENCOURAGED TO PRACTICE SOCIAL DISTANCING DUE TO THE COVID-19 PANDEMIC.

## 2019-08-30 LAB — CMP14+EGFR
ALT: 34 IU/L (ref 0–44)
AST: 26 IU/L (ref 0–40)
Albumin/Globulin Ratio: 1.8 (ref 1.2–2.2)
Albumin: 4.6 g/dL (ref 4.0–5.0)
Alkaline Phosphatase: 81 IU/L (ref 39–117)
BUN/Creatinine Ratio: 8 — ABNORMAL LOW (ref 9–20)
BUN: 10 mg/dL (ref 6–24)
Bilirubin Total: 0.2 mg/dL (ref 0.0–1.2)
CO2: 24 mmol/L (ref 20–29)
Calcium: 10.4 mg/dL — ABNORMAL HIGH (ref 8.7–10.2)
Chloride: 101 mmol/L (ref 96–106)
Creatinine, Ser: 1.25 mg/dL (ref 0.76–1.27)
GFR calc Af Amer: 79 mL/min/{1.73_m2} (ref >59)
GFR calc non Af Amer: 69 mL/min/{1.73_m2} (ref >59)
Globulin, Total: 2.5 g/dL (ref 1.5–4.5)
Glucose: 151 mg/dL — ABNORMAL HIGH (ref 65–99)
Potassium: 4.3 mmol/L (ref 3.5–5.2)
Sodium: 140 mmol/L (ref 134–144)
Total Protein: 7.1 g/dL (ref 6.0–8.5)

## 2019-08-30 LAB — HEMOGLOBIN A1C
Est. average glucose Bld gHb Est-mCnc: 180 mg/dL
Hgb A1c MFr Bld: 7.9 % — ABNORMAL HIGH (ref 4.8–5.6)

## 2019-09-04 DIAGNOSIS — Z1211 Encounter for screening for malignant neoplasm of colon: Secondary | ICD-10-CM | POA: Diagnosis not present

## 2019-09-04 MED FILL — SUPREP BOWEL PREP KIT: 17.5-3.13-1 | 1 days supply | Qty: 354 | Fill #0

## 2019-09-26 ENCOUNTER — Ambulatory Visit: Payer: 59 | Admitting: Nurse Practitioner

## 2019-09-26 ENCOUNTER — Encounter: Payer: Self-pay | Admitting: Nurse Practitioner

## 2019-09-26 ENCOUNTER — Other Ambulatory Visit (HOSPITAL_COMMUNITY)
Admission: RE | Admit: 2019-09-26 | Discharge: 2019-09-26 | Disposition: A | Discharge status: Home / Self Care | Payer: 59 | Source: Ambulatory Visit | Attending: Nurse Practitioner | Admitting: Nurse Practitioner

## 2019-09-26 ENCOUNTER — Other Ambulatory Visit: Payer: Self-pay

## 2019-09-26 VITALS — BP 138/72 | HR 82 | Temp 98.4°F | Ht 65.0 in | Wt 188.4 lb

## 2019-09-26 DIAGNOSIS — Z7689 Persons encountering health services in other specified circumstances: Secondary | ICD-10-CM

## 2019-09-26 DIAGNOSIS — Z202 Contact with and (suspected) exposure to infections with a predominantly sexual mode of transmission: Secondary | ICD-10-CM | POA: Diagnosis not present

## 2019-09-26 DIAGNOSIS — R300 Dysuria: Secondary | ICD-10-CM | POA: Diagnosis not present

## 2019-09-26 LAB — POCT URINALYSIS DIPSTICK
Bilirubin, UA: NEGATIVE
Blood, UA: NEGATIVE
Glucose, UA: POSITIVE — AB
Ketones, UA: NEGATIVE
Leukocytes, UA: NEGATIVE
Nitrite, UA: NEGATIVE
Protein, UA: NEGATIVE
Spec Grav, UA: 1.025 (ref 1.010–1.025)
Urobilinogen, UA: 0.2 E.U./dL
pH, UA: 7 (ref 5.0–8.0)

## 2019-09-26 MED ORDER — FLUCONAZOLE 100 MG PO TABS: 100.0000 mg | ORAL_TABLET | Freq: Every day | ORAL | 0 refills | Status: DC

## 2019-09-26 MED ORDER — FLUCONAZOLE 100 MG PO TABS: ORAL_TABLET | ORAL | 0 refills | Status: DC

## 2019-09-26 MED FILL — FLUCONAZOLE 100 MG TABLET: 100 | 5 days supply | Qty: 2 | Fill #0

## 2019-09-26 NOTE — Progress Notes (Signed)
This visit occurred during the SARS-CoV-2 public health emergency.  Safety protocols were in place, including screening questions prior to the visit, additional usage of staff PPE, and extensive cleaning of exam room while observing appropriate contact time as indicated for disinfecting solutions.  Subjective:     Patient ID: Adam Wiley , male    DOB: 1976/05/23 , 44 y.o.   MRN: [id number]   Chief Complaint  Patient presents with  . Exposure to STD  . Urinary Tract Infection    HPI  Exposure to STD The patient's pertinent negatives include no genital itching. The current episode started in the past 7 days (3 days ago ). Pertinent negatives include no abdominal pain, chills, frequency, headaches, nausea or vomiting. Nothing aggravates the symptoms. He has tried nothing for the symptoms. He is sexually active. He never uses condoms. Yes, his partner has an STD. There is no history of BPH, chlamydia, gonorrhea, herpes simplex, HIV, kidney stones or syphilis.  Urinary Tract Infection  This is a chronic problem. The current episode started more than 1 year ago. The problem occurs intermittently. Quality: discomfort to genital area. He is sexually active. There is no history of pyelonephritis. Pertinent negatives include no chills, discharge, frequency, nausea or vomiting. He has tried nothing for the symptoms. There is no history of kidney stones.     Past Medical History:  Diagnosis Date  . Diabetes mellitus without complication ([place])   . Hypertension      Family History  Problem Relation Age of Onset  . Diabetes Mother   . Diabetes Father      Current Outpatient Medications:  .  esomeprazole (NEXIUM) 40 MG capsule, Take 1 capsule by mouth daily, Disp: 90 capsule, Rfl: 1 .  Fenofibrate 150 MG CAPS, TAKE 1 CAPSULE BY MOUTH DAILY WITH FOOD, Disp: 30 capsule, Rfl: 0 .  fexofenadine (ALLEGRA) 180 MG tablet, Take 180 mg by mouth daily., Disp: , Rfl:  .  lisinopril-hydrochlorothiazide  (ZESTORETIC) 20-12.5 MG tablet, TAKE 1 TABLET BY MOUTH ONCE DAILY, Disp: 90 tablet, Rfl: 1 .  Saxagliptin-Metformin (KOMBIGLYZE XR) 11-998 MG TB24, TAKE 1 TABLET BY MOUTH DAILY WITH EVENING MEAL, Disp: 30 tablet, Rfl: 0 .  traMADol (ULTRAM) 50 MG tablet, Take 1 tablet (50 mg total) by mouth every 6 (six) hours as needed., Disp: 20 tablet, Rfl: 0   No Known Allergies   Review of Systems  Constitutional: Negative for chills and fatigue.  Respiratory: Negative.   Cardiovascular: Negative for palpitations.  Gastrointestinal: Negative for abdominal pain, nausea and vomiting.  Genitourinary: Negative for frequency.  Neurological: Negative for dizziness and headaches.  Psychiatric/Behavioral: Negative.      Today's Vitals   09/26/19 1146  BP: 138/72  Pulse: 82  Temp: 98.4 F (36.9 C)  Weight: 188 lb 6.4 oz (85.5 kg)  Height: 5\' 5"  (1.651 m)   Body mass index is 31.35 kg/m.   Objective:  Physical Exam Constitutional:      Appearance: Normal appearance.  Pulmonary:     Effort: Pulmonary effort is normal.  Neurological:     General: No focal deficit present.     Mental Status: He is alert and oriented to person, place, and time.     Cranial Nerves: No cranial nerve deficit.  Psychiatric:        Mood and Affect: Mood normal.        Behavior: Behavior normal.        Thought Content: Thought content normal.  Judgment: Judgment normal.         Assessment And Plan:     1. Burning with urination  Will check for STD and urine culture.  Urinalysis is normal  - POCT Urinalysis Dipstick (81002) - Culture, Urine - Urine cytology ancillary only - fluconazole (DIFLUCAN) 100 MG tablet; Take 1 tablet by mouth now then repeat in 5 days  Dispense: 2 tablet; Refill: 0  2. Encounter for assessment of STD exposure  - HIV antibody (with reflex) - T pallidum Screening Cascade - fluconazole (DIFLUCAN) 100 MG tablet; Take 1 tablet by mouth now then repeat in 5 days  Dispense: 2  tablet; Refill: 0     [name], FNP    THE PATIENT IS ENCOURAGED TO PRACTICE SOCIAL DISTANCING DUE TO THE COVID-19 PANDEMIC.

## 2019-09-27 ENCOUNTER — Other Ambulatory Visit: Payer: Self-pay | Admitting: Internal Medicine

## 2019-09-27 LAB — URINE CULTURE: Organism ID, Bacteria: NO GROWTH

## 2019-09-27 LAB — T PALLIDUM SCREENING CASCADE: T pallidum Antibodies (TP-PA): NONREACTIVE

## 2019-09-27 LAB — HIV ANTIBODY (ROUTINE TESTING W REFLEX): HIV Screen 4th Generation wRfx: NONREACTIVE

## 2019-09-27 MED FILL — KOMBIGLYZE XR 5-1,000 MG TA: 5-1000 | 30 days supply | Qty: 30 | Fill #0

## 2019-09-27 MED FILL — FENOFIBRATE 150 MG CAPSULE: 150 | 30 days supply | Qty: 30 | Fill #0

## 2019-09-28 ENCOUNTER — Encounter: Payer: Self-pay | Admitting: Internal Medicine

## 2019-09-28 DIAGNOSIS — Z1211 Encounter for screening for malignant neoplasm of colon: Secondary | ICD-10-CM | POA: Diagnosis not present

## 2019-09-28 DIAGNOSIS — D123 Benign neoplasm of transverse colon: Secondary | ICD-10-CM | POA: Diagnosis not present

## 2019-09-28 DIAGNOSIS — K635 Polyp of colon: Secondary | ICD-10-CM | POA: Diagnosis not present

## 2019-09-28 LAB — HM COLONOSCOPY

## 2019-10-03 LAB — URINE CYTOLOGY ANCILLARY ONLY
Candida Urine: NEGATIVE
Comment: NEGATIVE
Comment: NORMAL
Neisseria Gonorrhea: NEGATIVE
Trichomonas: NEGATIVE

## 2019-10-10 ENCOUNTER — Encounter: Payer: Self-pay | Admitting: Internal Medicine

## 2019-10-31 ENCOUNTER — Other Ambulatory Visit: Payer: Self-pay | Admitting: Internal Medicine

## 2019-10-31 MED FILL — KOMBIGLYZE XR 5-1,000 MG TA: 5-1000 | 30 days supply | Qty: 30 | Fill #0

## 2019-10-31 MED FILL — FENOFIBRATE 150 MG CAPSULE: 150 | 30 days supply | Qty: 30 | Fill #0

## 2019-11-06 ENCOUNTER — Other Ambulatory Visit: Payer: Self-pay | Admitting: Internal Medicine

## 2019-11-06 MED FILL — LISINOPRIL-HCTZ 20-12.5 MG: 20-12.5 | 90 days supply | Qty: 90 | Fill #0

## 2019-12-12 ENCOUNTER — Other Ambulatory Visit: Payer: Self-pay | Admitting: Internal Medicine

## 2019-12-12 MED FILL — KOMBIGLYZE XR 5-1,000 MG TA: 5-1000 | 30 days supply | Qty: 30 | Fill #0

## 2019-12-12 MED FILL — FENOFIBRATE 150 MG CAPSULE: 150 | 30 days supply | Qty: 30 | Fill #0

## 2019-12-19 ENCOUNTER — Encounter: Payer: Self-pay | Admitting: Internal Medicine

## 2019-12-19 ENCOUNTER — Other Ambulatory Visit: Payer: Self-pay

## 2019-12-19 ENCOUNTER — Ambulatory Visit: Payer: 59 | Admitting: Internal Medicine

## 2019-12-19 VITALS — BP 128/80 | HR 84 | Temp 98.0°F | Ht 71.0 in | Wt 184.4 lb

## 2019-12-19 DIAGNOSIS — Z0000 Encounter for general adult medical examination without abnormal findings: Secondary | ICD-10-CM | POA: Diagnosis not present

## 2019-12-19 DIAGNOSIS — E1165 Type 2 diabetes mellitus with hyperglycemia: Secondary | ICD-10-CM | POA: Diagnosis not present

## 2019-12-19 DIAGNOSIS — R0789 Other chest pain: Secondary | ICD-10-CM

## 2019-12-19 DIAGNOSIS — I10 Essential (primary) hypertension: Secondary | ICD-10-CM

## 2019-12-19 LAB — POCT URINALYSIS DIPSTICK
Bilirubin, UA: NEGATIVE
Blood, UA: NEGATIVE
Glucose, UA: POSITIVE — AB
Ketones, UA: NEGATIVE
Leukocytes, UA: NEGATIVE
Nitrite, UA: NEGATIVE
Protein, UA: NEGATIVE
Spec Grav, UA: >=1.03 — AB (ref 1.010–1.025)
Urobilinogen, UA: 0.2 E.U./dL
pH, UA: 6 (ref 5.0–8.0)

## 2019-12-19 LAB — POCT UA - MICROALBUMIN
Albumin/Creatinine Ratio, Urine, POC: <30
Creatinine, POC: 300 mg/dL
Microalbumin Ur, POC: 30 mg/L

## 2019-12-19 NOTE — Progress Notes (Signed)
This visit occurred during the SARS-CoV-2 public health emergency.  Safety protocols were in place, including screening questions prior to the visit, additional usage of staff PPE, and extensive cleaning of exam room while observing appropriate contact time as indicated for disinfecting solutions.  Subjective:     Patient ID: Adam Wiley , male    DOB: 1976/07/02 , 43 y.o.   MRN: [id number]   Chief Complaint  Patient presents with  . Annual Exam  . Diabetes  . Hypertension    HPI  He is here today for a full physical exam. He has no specific concerns or complaints at this time.  Diabetes He presents for his follow-up diabetic visit. He has type 2 diabetes mellitus. His disease course has been improving. There are no hypoglycemic associated symptoms. Associated symptoms include chest pain. Pertinent negatives for diabetes include no blurred vision. There are no hypoglycemic complications. Risk factors for coronary artery disease include diabetes mellitus, hypertension and male sex. He participates in exercise three times a week. He does not see a podiatrist.Eye exam is current.  Hypertension This is a chronic problem. The current episode started more than 1 year ago. The problem has been gradually improving since onset. The problem is controlled. Associated symptoms include chest pain. Pertinent negatives include no blurred vision. The current treatment provides moderate improvement. There are no compliance problems.      Past Medical History:  Diagnosis Date  . Diabetes mellitus without complication ([place])   . Hypertension      Family History  Problem Relation Age of Onset  . Diabetes Mother   . Diabetes Father      Current Outpatient Medications:  .  esomeprazole (NEXIUM) 40 MG capsule, Take 1 capsule by mouth daily, Disp: 90 capsule, Rfl: 1 .  Fenofibrate 150 MG CAPS, TAKE 1 CAPSULE BY MOUTH DAILY WITH FOOD, Disp: 30 capsule, Rfl: 0 .  fexofenadine (ALLEGRA) 180 MG tablet,  Take 180 mg by mouth daily., Disp: , Rfl:  .  KOMBIGLYZE XR 11-998 MG TB24, TAKE 1 TABLET BY MOUTH DAILY WITH EVENING MEAL, Disp: 30 tablet, Rfl: 0 .  lisinopril-hydrochlorothiazide (ZESTORETIC) 20-12.5 MG tablet, TAKE 1 TABLET BY MOUTH ONCE DAILY, Disp: 90 tablet, Rfl: 1 .  traMADol (ULTRAM) 50 MG tablet, Take 1 tablet (50 mg total) by mouth every 6 (six) hours as needed., Disp: 20 tablet, Rfl: 0   No Known Allergies   Men's preventive visit. Patient Health Questionnaire (PHQ-2) is    Office Visit from 12/19/2019 in [facility]  PHQ-2 Total Score  0    . Patient is on a healthy diet. Marital status: Married. Relevant history for alcohol use is:  Social History   Substance and Sexual Activity  Alcohol Use Yes   Comment: occasionally  . Relevant history for tobacco use is:  Social History   Tobacco Use  Smoking Status Light Tobacco Smoker  . Packs/day: 0.25  . Years: 5.00  . Pack years: 1.25  . Types: Cigarettes  Smokeless Tobacco Never Used  .  Review of Systems  Constitutional: Negative.   HENT: Negative.   Eyes: Negative.  Negative for blurred vision.  Respiratory: Negative.   Cardiovascular: Positive for chest pain.       States he had left sided cp about a week ago. He reports it occurred after lifting weights. Described as sharp, shooting. No associated SOB, palpitations. He has not had any recurrence of his sx. He admits he recently started lifting again.  He would like to see a cardiologist b/c he has not seen one yet.   Endocrine: Negative.   Genitourinary: Negative.   Musculoskeletal: Negative.   Skin: Negative.   Allergic/Immunologic: Negative.   Neurological: Negative.   Hematological: Negative.   Psychiatric/Behavioral: Negative.      Today's Vitals   12/19/19 1053  BP: 128/80  Pulse: 84  Temp: 98 F (36.7 C)  TempSrc: Oral  Weight: 184 lb 6.4 oz (83.6 kg)  Height: 5' 11"  (1.803 m)   Body mass index is 25.72 kg/m.   Objective:   Physical Exam Vitals and nursing note reviewed.  Constitutional:      Appearance: Normal appearance.  HENT:     Head: Normocephalic and atraumatic.     Right Ear: Tympanic membrane, ear canal and external ear normal.     Left Ear: Tympanic membrane, ear canal and external ear normal.     Nose:     Comments: Deferred, masked    Mouth/Throat:     Comments: Deferred, masked Eyes:     Extraocular Movements: Extraocular movements intact.     Conjunctiva/sclera: Conjunctivae normal.     Pupils: Pupils are equal, round, and reactive to light.  Cardiovascular:     Rate and Rhythm: Normal rate and regular rhythm.     Pulses: Normal pulses.          Dorsalis pedis pulses are 2+ on the right side and 2+ on the left side.     Heart sounds: Normal heart sounds.  Pulmonary:     Effort: Pulmonary effort is normal.     Breath sounds: Normal breath sounds.  Chest:     Breasts:        Right: Normal. No swelling, bleeding, inverted nipple, mass or nipple discharge.        Left: Normal. No swelling, bleeding, inverted nipple, mass or nipple discharge.  Abdominal:     General: Abdomen is flat. Bowel sounds are normal.     Palpations: Abdomen is soft.  Genitourinary:    Comments: Declined DRE Musculoskeletal:        General: Normal range of motion.     Cervical back: Normal range of motion and neck supple.  Feet:     Right foot:     Protective Sensation: 5 sites tested. 5 sites sensed.     Skin integrity: Skin integrity normal.     Toenail Condition: Right toenails are normal.     Left foot:     Protective Sensation: 5 sites tested. 5 sites sensed.     Skin integrity: Skin integrity normal.     Toenail Condition: Left toenails are normal.  Skin:    General: Skin is warm.     Comments: Scattered tattoos on chest, UE  Neurological:     General: No focal deficit present.     Mental Status: He is alert.  Psychiatric:        Mood and Affect: Mood normal.        Behavior: Behavior normal.          Assessment And Plan:     1. Routine general medical examination at health care facility  A full exam was performed. DRE declined. PATIENT IS ADVISED TO GET 30-45 MINUTES REGULAR EXERCISE NO LESS THAN FOUR TO FIVE DAYS PER WEEK - BOTH WEIGHTBEARING EXERCISES AND AEROBIC ARE RECOMMENDED.  SHE IS ADVISED TO FOLLOW A HEALTHY DIET WITH AT LEAST SIX FRUITS/VEGGIES PER DAY, DECREASE INTAKE OF RED MEAT, AND  TO INCREASE FISH INTAKE TO TWO DAYS PER WEEK.  MEATS/FISH SHOULD NOT BE FRIED, BAKED OR BROILED IS PREFERABLE.  I SUGGEST WEARING SPF 50 SUNSCREEN ON EXPOSED PARTS AND ESPECIALLY WHEN IN THE DIRECT SUNLIGHT FOR AN EXTENDED PERIOD OF TIME.  PLEASE AVOID FAST FOOD RESTAURANTS AND INCREASE YOUR WATER INTAKE.  - Hepatitis C antibody - CMP14+EGFR - CBC - Lipid panel - Hemoglobin A1c - PSA  2. Uncontrolled type 2 diabetes mellitus with hyperglycemia (HCC)  Diabetic foot exam was performed. I DISCUSSED WITH THE PATIENT AT LENGTH REGARDING THE GOALS OF GLYCEMIC CONTROL AND POSSIBLE LONG-TERM COMPLICATIONS.  I  ALSO STRESSED THE IMPORTANCE OF COMPLIANCE WITH HOME GLUCOSE MONITORING, DIETARY RESTRICTIONS INCLUDING AVOIDANCE OF SUGARY DRINKS/PROCESSED FOODS,  ALONG WITH REGULAR EXERCISE.  I  ALSO STRESSED THE IMPORTANCE OF ANNUAL EYE EXAMS, SELF FOOT CARE AND COMPLIANCE WITH OFFICE VISITS.   3. Essential hypertension, benign  Chronic, well controlled. She will continue with current meds. He is encouraged to avoid adding salt to his foods. EKG performed, NSR w/o acute changes.   - POCT Urinalysis Dipstick (81002) - POCT UA - Microalbumin - EKG 12-Lead  4. Chest pain, atypical  I think his sx are due to musculoskeletal strain. However, due to his cardiac risk factors including male sex, tobacco use, DM and HTN - I will refer him to Cardiology for further evaluation. He is in agreement with his treatment plan.   - Ambulatory referral to Cardiology  The 10-year ASCVD risk score Mikey Bussing DC Brooke Bonito.,  et al., 2013) is: 23.9%   Values used to calculate the score:     Age: 51 years     Sex: Male     Is Non-Hispanic African American: Yes     Diabetic: Yes     Tobacco smoker: Yes     Systolic Blood Pressure: 023 mmHg     Is BP treated: Yes     HDL Cholesterol: 39 mg/dL     Total Cholesterol: 218 mg/dL   [name], MD    THE PATIENT IS ENCOURAGED TO PRACTICE SOCIAL DISTANCING DUE TO THE COVID-19 PANDEMIC.

## 2019-12-19 NOTE — Patient Instructions (Signed)

## 2019-12-20 LAB — CMP14+EGFR
ALT: 19 IU/L (ref 0–44)
AST: 20 IU/L (ref 0–40)
Albumin/Globulin Ratio: 1.7 (ref 1.2–2.2)
Albumin: 4.5 g/dL (ref 4.0–5.0)
Alkaline Phosphatase: 73 IU/L (ref 48–121)
BUN/Creatinine Ratio: 11 (ref 9–20)
BUN: 15 mg/dL (ref 6–24)
Bilirubin Total: 0.3 mg/dL (ref 0.0–1.2)
CO2: 22 mmol/L (ref 20–29)
Calcium: 10.4 mg/dL — ABNORMAL HIGH (ref 8.7–10.2)
Chloride: 100 mmol/L (ref 96–106)
Creatinine, Ser: 1.31 mg/dL — ABNORMAL HIGH (ref 0.76–1.27)
GFR calc Af Amer: 75 mL/min/{1.73_m2} (ref >59)
GFR calc non Af Amer: 65 mL/min/{1.73_m2} (ref >59)
Globulin, Total: 2.6 g/dL (ref 1.5–4.5)
Glucose: 211 mg/dL — ABNORMAL HIGH (ref 65–99)
Potassium: 4.8 mmol/L (ref 3.5–5.2)
Sodium: 139 mmol/L (ref 134–144)
Total Protein: 7.1 g/dL (ref 6.0–8.5)

## 2019-12-20 LAB — CBC
Hematocrit: 42.4 % (ref 37.5–51.0)
Hemoglobin: 14.1 g/dL (ref 13.0–17.7)
MCH: 29.3 pg (ref 26.6–33.0)
MCHC: 33.3 g/dL (ref 31.5–35.7)
MCV: 88 fL (ref 79–97)
Platelets: 324 10*3/uL (ref 150–450)
RBC: 4.81 x10E6/uL (ref 4.14–5.80)
RDW: 13.3 % (ref 11.6–15.4)
WBC: 7.3 10*3/uL (ref 3.4–10.8)

## 2019-12-20 LAB — LIPID PANEL
Chol/HDL Ratio: 5.6 ratio — ABNORMAL HIGH (ref 0.0–5.0)
Cholesterol, Total: 218 mg/dL — ABNORMAL HIGH (ref 100–199)
HDL: 39 mg/dL — ABNORMAL LOW (ref >39)
LDL Chol Calc (NIH): 143 mg/dL — ABNORMAL HIGH (ref 0–99)
Triglycerides: 196 mg/dL — ABNORMAL HIGH (ref 0–149)
VLDL Cholesterol Cal: 36 mg/dL (ref 5–40)

## 2019-12-20 LAB — HEMOGLOBIN A1C
Est. average glucose Bld gHb Est-mCnc: 226 mg/dL
Hgb A1c MFr Bld: 9.5 % — ABNORMAL HIGH (ref 4.8–5.6)

## 2019-12-20 LAB — PSA: Prostate Specific Ag, Serum: 1.2 ng/mL (ref 0.0–4.0)

## 2019-12-20 LAB — HEPATITIS C ANTIBODY: Hep C Virus Ab: <0.1 s/co ratio (ref 0.0–0.9)

## 2020-01-11 ENCOUNTER — Other Ambulatory Visit: Payer: Self-pay | Admitting: Internal Medicine

## 2020-01-11 MED FILL — FENOFIBRATE 150 MG CAPSULE: 150 | 30 days supply | Qty: 30 | Fill #0

## 2020-01-11 MED FILL — KOMBIGLYZE XR 5-1,000 MG TA: 5-1000 | 30 days supply | Qty: 30 | Fill #0

## 2020-02-06 MED FILL — LISINOPRIL-HCTZ 20-12.5 MG: 20-12.5 | 90 days supply | Qty: 90 | Fill #1

## 2020-02-16 ENCOUNTER — Ambulatory Visit (INDEPENDENT_AMBULATORY_CARE_PROVIDER_SITE_OTHER): Payer: 59

## 2020-02-16 ENCOUNTER — Other Ambulatory Visit: Payer: Self-pay

## 2020-02-16 DIAGNOSIS — Z23 Encounter for immunization: Secondary | ICD-10-CM | POA: Diagnosis not present

## 2020-02-16 MED FILL — KOMBIGLYZE XR 5-1,000 MG TA: 5-1000 | 30 days supply | Qty: 30 | Fill #1

## 2020-02-16 MED FILL — FENOFIBRATE 150 MG CAPSULE: 150 | 30 days supply | Qty: 30 | Fill #1

## 2020-02-16 NOTE — Progress Notes (Signed)
   Covid-19 Vaccination Clinic  Name:  Adam Wiley    MRN: [id number] DOB: [birth year]  02/16/2020  [name] was observed post Covid-19 immunization for 15 minutes without incident. He was provided with Vaccine Information Sheet and instruction to access the V-Safe system.   [name] was instructed to call 911 with any severe reactions post vaccine: Marland Kitchen Difficulty breathing  . Swelling of face and throat  . A fast heartbeat  . A bad rash all over body  . Dizziness and weakness   Immunizations Administered    Name Date Dose VIS Date Route   Moderna COVID-19 Vaccine 02/16/2020 11:35 AM 0.5 mL 06/2019 Intramuscular   Manufacturer: Moderna   Lot: [id number]   [place]: [id number]

## 2020-03-07 ENCOUNTER — Ambulatory Visit: Payer: 59 | Admitting: Cardiovascular Disease

## 2020-03-07 ENCOUNTER — Other Ambulatory Visit: Payer: Self-pay

## 2020-03-07 ENCOUNTER — Encounter: Payer: Self-pay | Admitting: Cardiovascular Disease

## 2020-03-07 VITALS — BP 142/86 | HR 73 | Ht 72.0 in | Wt 178.0 lb

## 2020-03-07 DIAGNOSIS — R0600 Dyspnea, unspecified: Secondary | ICD-10-CM | POA: Diagnosis not present

## 2020-03-07 DIAGNOSIS — Z8249 Family history of ischemic heart disease and other diseases of the circulatory system: Secondary | ICD-10-CM

## 2020-03-07 DIAGNOSIS — Z01812 Encounter for preprocedural laboratory examination: Secondary | ICD-10-CM | POA: Diagnosis not present

## 2020-03-07 DIAGNOSIS — Z720 Tobacco use: Secondary | ICD-10-CM

## 2020-03-07 DIAGNOSIS — E1165 Type 2 diabetes mellitus with hyperglycemia: Secondary | ICD-10-CM

## 2020-03-07 DIAGNOSIS — E7800 Pure hypercholesterolemia, unspecified: Secondary | ICD-10-CM | POA: Insufficient documentation

## 2020-03-07 DIAGNOSIS — E119 Type 2 diabetes mellitus without complications: Secondary | ICD-10-CM | POA: Diagnosis not present

## 2020-03-07 DIAGNOSIS — I10 Essential (primary) hypertension: Secondary | ICD-10-CM

## 2020-03-07 DIAGNOSIS — R0609 Other forms of dyspnea: Secondary | ICD-10-CM

## 2020-03-07 HISTORY — DX: Other forms of dyspnea: R06.09

## 2020-03-07 HISTORY — DX: Dyspnea, unspecified: R06.00

## 2020-03-07 HISTORY — DX: Tobacco use: Z72.0

## 2020-03-07 HISTORY — DX: Pure hypercholesterolemia, unspecified: E78.00

## 2020-03-07 HISTORY — DX: Type 2 diabetes mellitus without complications: E11.9

## 2020-03-07 MED ORDER — METOPROLOL TARTRATE 100 MG PO TABS: ORAL_TABLET | ORAL | 0 refills | Status: DC

## 2020-03-07 MED FILL — METOPROLOL TARTRATE 100 MG: 100 | 1 days supply | Qty: 1 | Fill #0

## 2020-03-07 NOTE — Patient Instructions (Addendum)
Medication Instructions:  TAKE METOPROLOL 100 MG 2 HOURS PRIOR TO CT  *If you need a refill on your cardiac medications before your next appointment, please call your pharmacy*  Lab Work: BMET 1 WEEK PRIOR  If you have labs (blood work) drawn today and your tests are completely normal, you will receive your results only by: Marland Kitchen MyChart Message (if you have MyChart) OR . A paper copy in the mail If you have any lab test that is abnormal or we need to change your treatment, we will call you to review the results.  Testing/Procedures: Your physician has requested that you have cardiac CT. Cardiac computed tomography (CT) is a painless test that uses an x-ray machine to take clear, detailed pictures of your heart. For further information please visit [email or web address]. Please follow instruction sheet as given.  Follow-Up: At [facility], you and your health needs are our priority.  As part of our continuing mission to provide you with exceptional heart care, we have created designated Provider Care Teams.  These Care Teams include your primary Cardiologist (physician) and Advanced Practice Providers (APPs -  Physician Assistants and Nurse Practitioners) who all work together to provide you with the care you need, when you need it.  We recommend signing up for the patient portal called "MyChart".  Sign up information is provided on this After Visit Summary.  MyChart is used to connect with patients for Virtual Visits (Telemedicine).  Patients are able to view lab/test results, encounter notes, upcoming appointments, etc.  Non-urgent messages can be sent to your provider as well.   To learn more about what you can do with MyChart, go to [email or web address].    Your next appointment:   6 week(s)  The format for your next appointment:   In Person  Provider:   You may see DR [facility] or one of the following Advanced Practice Providers on your designated Care Team:    Kerin Ransom,  PA-C  [place], [place]  Coletta Memos, [place]   Other Instructions  Your cardiac CT will be scheduled at one of the below locations:   [facility] [address] [place], [place] [address] [phone]  [place] [address] [place], [place] [address] [phone]  If scheduled at [facility], please arrive at the [facility] main entrance of [facility] 30 minutes prior to test start time. Proceed to the [facility] Radiology Department (first floor) to check-in and test prep.  If scheduled at [facility], please arrive 15 mins early for check-in and test prep.  Please follow these instructions carefully (unless otherwise directed):  Hold all erectile dysfunction medications at least 3 days (72 hrs) prior to test.  On the Night Before the Test: . Be sure to Drink plenty of water. . Do not consume any caffeinated/decaffeinated beverages or chocolate 12 hours prior to your test. . Do not take any antihistamines 12 hours prior to your test. . If the patient has contrast allergy: ? Patient will need a prescription for Prednisone and very clear instructions (as follows): 1. Prednisone 50 mg - take 13 hours prior to test 2. Take another Prednisone 50 mg 7 hours prior to test 3. Take another Prednisone 50 mg 1 hour prior to test 4. Take Benadryl 50 mg 1 hour prior to test . Patient must complete all four doses of above prophylactic medications. . Patient will need a ride after test  due to Benadryl.  On the Day of the Test: . Drink plenty of water. Do not drink any water within one hour of the test. . Do not eat any food 4 hours prior to the test. . You may take your regular medications prior to the test.  . Take metoprolol (Lopressor) two hours prior to test. . HOLD Furosemide/Hydrochlorothiazide morning of the test. . FEMALES- please wear underwire-free bra if  available       After the Test: . Drink plenty of water. . After receiving IV contrast, you may experience a mild flushed feeling. This is normal. . On occasion, you may experience a mild rash up to 24 hours after the test. This is not dangerous. If this occurs, you can take Benadryl 25 mg and increase your fluid intake. . If you experience trouble breathing, this can be serious. If it is severe call 911 IMMEDIATELY. If it is mild, please call our office. . If you take any of these medications: Glipizide/Metformin, Avandament, Glucavance, please do not take 48 hours after completing test unless otherwise instructed.   Once we have confirmed authorization from your insurance company, we will call you to set up a date and time for your test. Based on how quickly your insurance processes prior authorizations requests, please allow up to 4 weeks to be contacted for scheduling your Cardiac CT appointment. Be advised that routine Cardiac CT appointments could be scheduled as many as 8 weeks after your provider has ordered it.  For non-scheduling related questions, please contact the cardiac imaging nurse navigator should you have any questions/concerns: Marchia Bond, Cardiac Imaging Nurse Navigator Burley Saver, Interim Cardiac Imaging Nurse [place] [facility] V[facility] Services Direct Office Dial: [phone]   For scheduling needs, including cancellations and rescheduling, please call Vivien Rota at [phone], option [phone].     Cardiac CT Angiogram A cardiac CT angiogram is a procedure to look at the heart and the area around the heart. It may be done to help find the cause of chest pains or other symptoms of heart disease. During this procedure, a substance called contrast dye is injected into the blood vessels in the area to be checked. A large X-ray machine, called a CT scanner, then takes detailed pictures of the heart and the surrounding area. The procedure is also sometimes called a  coronary CT angiogram, coronary artery scanning, or CTA. A cardiac CT angiogram allows the health care provider to see how well blood is flowing to and from the heart. The health care provider will be able to see if there are any problems, such as: Blockage or narrowing of the coronary arteries in the heart. Fluid around the heart. Signs of weakness or disease in the muscles, valves, and tissues of the heart. Tell a health care provider about: Any allergies you have. This is especially important if you have had a previous allergic reaction to contrast dye. All medicines you are taking, including vitamins, herbs, eye drops, creams, and over-the-counter medicines. Any blood disorders you have. Any surgeries you have had. Any medical conditions you have. Whether you are pregnant or may be pregnant. Any anxiety disorders, chronic pain, or other conditions you have that may increase your stress or prevent you from lying still. What are the risks? Generally, this is a safe procedure. However, problems may occur, including: Bleeding. Infection. Allergic reactions to medicines or dyes. Damage to other structures or organs. Kidney damage from the contrast dye that is used. Increased risk of  cancer from radiation exposure. This risk is low. Talk with your health care provider about: The risks and benefits of testing. How you can receive the lowest dose of radiation. What happens before the procedure? Wear comfortable clothing and remove any jewelry, glasses, dentures, and hearing aids. Follow instructions from your health care provider about eating and drinking. This may include: For 12 hours before the procedure -- avoid caffeine. This includes tea, coffee, soda, energy drinks, and diet pills. Drink plenty of water or other fluids that do not have caffeine in them. Being well hydrated can prevent complications. For 4-6 hours before the procedure -- stop eating and drinking. The contrast dye can  cause nausea, but this is less likely if your stomach is empty. Ask your health care provider about changing or stopping your regular medicines. This is especially important if you are taking diabetes medicines, blood thinners, or medicines to treat problems with erections (erectile dysfunction). What happens during the procedure?  Hair on your chest may need to be removed so that small sticky patches called electrodes can be placed on your chest. These will transmit information that helps to monitor your heart during the procedure. An IV will be inserted into one of your veins. You might be given a medicine to control your heart rate during the procedure. This will help to ensure that good images are obtained. You will be asked to lie on an exam table. This table will slide in and out of the CT machine during the procedure. Contrast dye will be injected into the IV. You might feel warm, or you may get a metallic taste in your mouth. You will be given a medicine called nitroglycerin. This will relax or dilate the arteries in your heart. The table that you are lying on will move into the CT machine tunnel for the scan. The person running the machine will give you instructions while the scans are being done. You may be asked to: Keep your arms above your head. Hold your breath. Stay very still, even if the table is moving. When the scanning is complete, you will be moved out of the machine. The IV will be removed. The procedure may vary among health care providers and hospitals. What can I expect after the procedure? After your procedure, it is common to have: A metallic taste in your mouth from the contrast dye. A feeling of warmth. A headache from the nitroglycerin. Follow these instructions at home: Take over-the-counter and prescription medicines only as told by your health care provider. If you are told, drink enough fluid to keep your urine pale yellow. This will help to flush the  contrast dye out of your body. Most people can return to their normal activities right after the procedure. Ask your health care provider what activities are safe for you. It is up to you to get the results of your procedure. Ask your health care provider, or the department that is doing the procedure, when your results will be ready. Keep all follow-up visits as told by your health care provider. This is important. Contact a health care provider if: You have any symptoms of allergy to the contrast dye. These include: Shortness of breath. Rash or hives. A racing heartbeat. Summary A cardiac CT angiogram is a procedure to look at the heart and the area around the heart. It may be done to help find the cause of chest pains or other symptoms of heart disease. During this procedure, a large X-ray machine,  called a CT scanner, takes detailed pictures of the heart and the surrounding area after a contrast dye has been injected into blood vessels in the area. Ask your health care provider about changing or stopping your regular medicines before the procedure. This is especially important if you are taking diabetes medicines, blood thinners, or medicines to treat erectile dysfunction. If you are told, drink enough fluid to keep your urine pale yellow. This will help to flush the contrast dye out of your body. This information is not intended to replace advice given to you by your health care provider. Make sure you discuss any questions you have with your health care provider. Document Revised: 02/22/2019 Document Reviewed: 02/22/2019 Elsevier Patient Education  [place].

## 2020-03-07 NOTE — Progress Notes (Signed)
Cardiology Office Note   Date:  03/07/2020   ID:  AQUAN KOPE, DOB [birth year], MRN [id number]  PCP:  Glendale Chard, MD  Cardiologist:   Skeet Latch, MD   No chief complaint on file.   History of Present Illness: Adam Wiley is a 44 y.o. male with diabetes and hypertension who is being seen today for the evaluation of chest pain at the request of Glendale Chard, MD.  A few months ago he was running and noted some shortness of breath.  There was no chest pain at the time. He exercises multiple times per week and typically has no exertional symptoms. He is exercising since this time and has not had any issues. He did have one episode of heart fluttering when laying down. It lasted for about 30 seconds and was not associated with any chest pain, shortness of breath, lightheadedness, or dizziness. He has no lower extremity edema. He denies orthopnea or PND. He does snore but does not think he has apnea. He feels generally refreshed when he wakes up, though he does not sleep very much. He is working the night shift for many years. It makes it hard for him to sleep during the day. He drinks 1 coffee daily. He does not drink alcohol. He smokes 1 cigar occasionally. He previously smoked cigarettes but not recently. He was diagnosed with diabetes about 10 years ago. He does have a sister who died from complications of diabetes at a very young age. When he checks his blood pressure at home it typically is in the 120s to 130s over 70s. Tries to eat a very healthy diet and follow "clean eating.) He limits his sodium intake, though he does use Himalayan salt and sea salt.   Past Medical History:  Diagnosis Date  . Diabetes mellitus type 2 in nonobese ([place]) 03/07/2020  . Diabetes mellitus without complication ([place])   . Exertional dyspnea 03/07/2020  . Hypertension   . Pure hypercholesterolemia 03/07/2020  . Tobacco abuse 03/07/2020    No past surgical history on file.   Current Outpatient  Medications  Medication Sig Dispense Refill  . esomeprazole (NEXIUM) 40 MG capsule Take 1 capsule by mouth daily 90 capsule 1  . Fenofibrate 150 MG CAPS TAKE 1 CAPSULE BY MOUTH DAILY WITH FOOD 30 capsule 3  . fexofenadine (ALLEGRA) 180 MG tablet Take 180 mg by mouth daily.    Marland Kitchen KOMBIGLYZE XR 11-998 MG TB24 TAKE 1 TABLET BY MOUTH DAILY WITH EVENING MEAL 30 tablet 3  . lisinopril-hydrochlorothiazide (ZESTORETIC) 20-12.5 MG tablet TAKE 1 TABLET BY MOUTH ONCE DAILY 90 tablet 1  . traMADol (ULTRAM) 50 MG tablet Take 1 tablet (50 mg total) by mouth every 6 (six) hours as needed. 20 tablet 0  . metoprolol tartrate (LOPRESSOR) 100 MG tablet TAKE 1 TABLET 2 HOURS PRIOR TO PROCEDURE 1 tablet 0   No current facility-administered medications for this visit.    Allergies:   Patient has no known allergies.    Social History:  The patient  reports that he has been smoking cigarettes. He has a 1.25 pack-year smoking history. He has never used smokeless tobacco. He reports current alcohol use. He reports that he does not use drugs.   Family History:  The patient's family history includes Diabetes in his father, mother, and sister; High blood pressure in an other family member; Hypertension in his brother and sister.    ROS:  Please see the history of present illness.  Otherwise, review of systems are positive for none.   All other systems are reviewed and negative.    PHYSICAL EXAM: VS:  BP (!) 142/86   Pulse 73   Ht 6' (1.829 m)   Wt 178 lb (80.7 kg)   SpO2 98%   BMI 24.14 kg/m  , BMI Body mass index is 24.14 kg/m. GENERAL:  Well appearing HEENT:  Pupils equal round and reactive, fundi not visualized, oral mucosa unremarkable NECK:  No jugular venous distention, waveform within normal limits, carotid upstroke brisk and symmetric, no bruits, no thyromegaly LYMPHATICS:  No cervical adenopathy LUNGS:  Clear to auscultation bilaterally HEART:  RRR.  PMI not displaced or sustained,S1 and S2 within  normal limits, no S3, no S4, no clicks, no rubs, no murmurs ABD:  Flat, positive bowel sounds normal in frequency in pitch, no bruits, no rebound, no guarding, no midline pulsatile mass, no hepatomegaly, no splenomegaly EXT:  2 plus pulses throughout, no edema, no cyanosis no clubbing SKIN:  No rashes no nodules NEURO:  Cranial nerves II through XII grossly intact, motor grossly intact throughout PSYCH:  Cognitively intact, oriented to person place and time  EKG:  EKG is not ordered today. The ekg ordered today demonstrates sinus rhythm.  Rate 64 bpm.  Recent Labs: 12/19/2019: ALT 19; BUN 15; Creatinine, Ser 1.31; Hemoglobin 14.1; Platelets 324; Potassium 4.8; Sodium 139    Lipid Panel    Component Value Date/Time   CHOL 218 (H) 12/19/2019 1138   TRIG 196 (H) 12/19/2019 1138   HDL 39 (L) 12/19/2019 1138   CHOLHDL 5.6 (H) 12/19/2019 1138   LDLCALC 143 (H) 12/19/2019 1138      Wt Readings from Last 3 Encounters:  03/07/20 178 lb (80.7 kg)  12/19/19 184 lb 6.4 oz (83.6 kg)  09/26/19 188 lb 6.4 oz (85.5 kg)      ASSESSMENT AND PLAN:  # Exertional dyspnea: # Essential hypertension:  # Pure hypercholesterolemia: Adam Wiley had one episode of exertional dyspnea. He has been able to exercise without issue since that time. We had a long discussion about his cardiovascular risk and the fact that he is uncontrolled diabetes, hypertension, and hyperlipidemia. I do not actually think that his episode was due to obstructive coronary disease. However it is quite likely that he does have some degree of coronary artery disease. He is reluctant to take statins or other medications. We will get a coronary CT-a to better understand his coronary anatomy and cardiovascular risk. This will also help to ensure that he is not having ischemic symptoms. For now, continue his current regimen of lisinopril and hydrochlorothiazide. We will need to discuss statins and potentially aspirin and beta-blockers based  on the results of his study. His blood pressure was initially elevated but better on repeat. Continue the above management for now.   Current medicines are reviewed at length with the patient today.  The patient does not have concerns regarding medicines.  The following changes have been made:  no change  Labs/ tests ordered today include:   Orders Placed This Encounter  Procedures  . CT CORONARY MORPH W/CTA COR W/SCORE W/CA W/CM &/OR WO/CM  . CT CORONARY FRACTIONAL FLOW RESERVE DATA PREP  . CT CORONARY FRACTIONAL FLOW RESERVE FLUID ANALYSIS  . Basic metabolic panel     Disposition:   FU with Jessic Standifer C. Oval Linsey, MD, [facility] in 6 weeks    Signed, Yaak Oval Linsey, MD, [facility]  03/07/2020 2:00 PM    [facility]

## 2020-03-15 ENCOUNTER — Other Ambulatory Visit: Payer: Self-pay

## 2020-03-15 ENCOUNTER — Ambulatory Visit (INDEPENDENT_AMBULATORY_CARE_PROVIDER_SITE_OTHER): Payer: 59

## 2020-03-15 DIAGNOSIS — Z23 Encounter for immunization: Secondary | ICD-10-CM

## 2020-03-15 NOTE — Progress Notes (Signed)
   Covid-19 Vaccination Clinic  Name:  Adam Wiley    MRN: [id number] DOB: Aug 24, 1975  03/15/2020  Mr. [name] was observed post Covid-19 immunization for 15 minutes without incident. He was provided with Vaccine Information Sheet and instruction to access the V-Safe system.   Mr. [name] was instructed to call 911 with any severe reactions post vaccine: Marland Kitchen Difficulty breathing  . Swelling of face and throat  . A fast heartbeat  . A bad rash all over body  . Dizziness and weakness   Immunizations Administered    Name Date Dose VIS Date Route   Moderna COVID-19 Vaccine 03/15/2020 11:55 AM 0.5 mL 06/2019 Intramuscular   Manufacturer: Moderna   Lot: [id number]   [place]: [id number]

## 2020-03-21 ENCOUNTER — Ambulatory Visit (HOSPITAL_COMMUNITY): Admission: RE | Admit: 2020-03-21 | Payer: 59 | Source: Ambulatory Visit

## 2020-03-21 MED FILL — KOMBIGLYZE XR 5-1,000 MG TA: 5-1000 | 30 days supply | Qty: 30 | Fill #2

## 2020-03-21 MED FILL — FENOFIBRATE 150 MG CAPSULE: 150 | 30 days supply | Qty: 30 | Fill #2

## 2020-03-29 DIAGNOSIS — I10 Essential (primary) hypertension: Secondary | ICD-10-CM | POA: Diagnosis not present

## 2020-03-29 DIAGNOSIS — E1165 Type 2 diabetes mellitus with hyperglycemia: Secondary | ICD-10-CM | POA: Diagnosis not present

## 2020-03-29 DIAGNOSIS — Z01812 Encounter for preprocedural laboratory examination: Secondary | ICD-10-CM | POA: Diagnosis not present

## 2020-03-29 LAB — BASIC METABOLIC PANEL
BUN/Creatinine Ratio: 11 (ref 9–20)
BUN: 14 mg/dL (ref 6–24)
CO2: 23 mmol/L (ref 20–29)
Calcium: 10 mg/dL (ref 8.7–10.2)
Chloride: 104 mmol/L (ref 96–106)
Creatinine, Ser: 1.32 mg/dL — ABNORMAL HIGH (ref 0.76–1.27)
GFR calc Af Amer: 74 mL/min/{1.73_m2} (ref >59)
GFR calc non Af Amer: 64 mL/min/{1.73_m2} (ref >59)
Glucose: 98 mg/dL (ref 65–99)
Potassium: 4.6 mmol/L (ref 3.5–5.2)
Sodium: 141 mmol/L (ref 134–144)

## 2020-04-03 ENCOUNTER — Telehealth (HOSPITAL_COMMUNITY): Payer: Self-pay | Admitting: Emergency Medicine

## 2020-04-03 NOTE — Telephone Encounter (Signed)
Pt requesting to r/s. Gave him scheduling phone number to call when he is ready to do the test. States it costs too much for him at this time.  Marchia Bond RN Navigator Cardiac Imaging [facility] [phone] Office  [phone] Cell

## 2020-04-05 ENCOUNTER — Ambulatory Visit (HOSPITAL_COMMUNITY): Payer: 59

## 2020-04-23 ENCOUNTER — Ambulatory Visit: Payer: 59 | Admitting: Internal Medicine

## 2020-04-23 ENCOUNTER — Encounter: Payer: Self-pay | Admitting: Internal Medicine

## 2020-04-23 ENCOUNTER — Other Ambulatory Visit: Payer: Self-pay

## 2020-04-23 VITALS — BP 132/78 | HR 68 | Temp 98.3°F | Ht 72.0 in | Wt 181.0 lb

## 2020-04-23 DIAGNOSIS — Z23 Encounter for immunization: Secondary | ICD-10-CM | POA: Diagnosis not present

## 2020-04-23 DIAGNOSIS — I10 Essential (primary) hypertension: Secondary | ICD-10-CM | POA: Diagnosis not present

## 2020-04-23 DIAGNOSIS — E7800 Pure hypercholesterolemia, unspecified: Secondary | ICD-10-CM

## 2020-04-23 DIAGNOSIS — E1165 Type 2 diabetes mellitus with hyperglycemia: Secondary | ICD-10-CM

## 2020-04-23 NOTE — Patient Instructions (Signed)
Diabetes Mellitus and Exercise Exercising regularly is important for your overall health, especially when you have diabetes (diabetes mellitus). Exercising is not only about losing weight. It has many other health benefits, such as increasing muscle strength and bone density and reducing body fat and stress. This leads to improved fitness, flexibility, and endurance, all of which result in better overall health. Exercise has additional benefits for people with diabetes, including:  Reducing appetite.  Helping to lower and control blood glucose.  Lowering blood pressure.  Helping to control amounts of fatty substances (lipids) in the blood, such as cholesterol and triglycerides.  Helping the body to respond better to insulin (improving insulin sensitivity).  Reducing how much insulin the body needs.  Decreasing the risk for heart disease by: ? Lowering cholesterol and triglyceride levels. ? Increasing the levels of good cholesterol. ? Lowering blood glucose levels. What is my activity plan? Your health care provider or certified diabetes educator can help you make a plan for the type and frequency of exercise (activity plan) that works for you. Make sure that you:  Do at least 150 minutes of moderate-intensity or vigorous-intensity exercise each week. This could be brisk walking, biking, or water aerobics. ? Do stretching and strength exercises, such as yoga or weightlifting, at least 2 times a week. ? Spread out your activity over at least 3 days of the week.  Get some form of physical activity every day. ? Do not go more than 2 days in a row without some kind of physical activity. ? Avoid being inactive for more than 30 minutes at a time. Take frequent breaks to walk or stretch.  Choose a type of exercise or activity that you enjoy, and set realistic goals.  Start slowly, and gradually increase the intensity of your exercise over time. What do I need to know about managing my  diabetes?   Check your blood glucose before and after exercising. ? If your blood glucose is 240 mg/dL (13.3 mmol/L) or higher before you exercise, check your urine for ketones. If you have ketones in your urine, do not exercise until your blood glucose returns to normal. ? If your blood glucose is 100 mg/dL (5.6 mmol/L) or lower, eat a snack containing 15-20 grams of carbohydrate. Check your blood glucose 15 minutes after the snack to make sure that your level is above 100 mg/dL (5.6 mmol/L) before you start your exercise.  Know the symptoms of low blood glucose (hypoglycemia) and how to treat it. Your risk for hypoglycemia increases during and after exercise. Common symptoms of hypoglycemia can include: ? Hunger. ? Anxiety. ? Sweating and feeling clammy. ? Confusion. ? Dizziness or feeling light-headed. ? Increased heart rate or palpitations. ? Blurry vision. ? Tingling or numbness around the mouth, lips, or tongue. ? Tremors or shakes. ? Irritability.  Keep a rapid-acting carbohydrate snack available before, during, and after exercise to help prevent or treat hypoglycemia.  Avoid injecting insulin into areas of the body that are going to be exercised. For example, avoid injecting insulin into: ? The arms, when playing tennis. ? The legs, when jogging.  Keep records of your exercise habits. Doing this can help you and your health care provider adjust your diabetes management plan as needed. Write down: ? Food that you eat before and after you exercise. ? Blood glucose levels before and after you exercise. ? The type and amount of exercise you have done. ? When your insulin is expected to peak, if you use   insulin. Avoid exercising at times when your insulin is peaking.  When you start a new exercise or activity, work with your health care provider to make sure the activity is safe for you, and to adjust your insulin, medicines, or food intake as needed.  Drink plenty of water while  you exercise to prevent dehydration or heat stroke. Drink enough fluid to keep your urine clear or pale yellow. Summary  Exercising regularly is important for your overall health, especially when you have diabetes (diabetes mellitus).  Exercising has many health benefits, such as increasing muscle strength and bone density and reducing body fat and stress.  Your health care provider or certified diabetes educator can help you make a plan for the type and frequency of exercise (activity plan) that works for you.  When you start a new exercise or activity, work with your health care provider to make sure the activity is safe for you, and to adjust your insulin, medicines, or food intake as needed. This information is not intended to replace advice given to you by your health care provider. Make sure you discuss any questions you have with your health care provider. Document Revised: 01/21/2017 Document Reviewed: 12/09/2015 Elsevier Patient Education  2020 [employer]vier Inc.  

## 2020-04-23 NOTE — Progress Notes (Signed)
I,[name],acting as a Education administrator for [name], MD.,have documented all relevant documentation on the behalf of [name], MD,as directed by  [name], MD while in the presence of [name], MD.  This visit occurred during the SARS-CoV-2 public health emergency.  Safety protocols were in place, including screening questions prior to the visit, additional usage of staff PPE, and extensive cleaning of exam room while observing appropriate contact time as indicated for disinfecting solutions.  Subjective:     Patient ID: Adam Wiley , male    DOB: [birth year] , 44 y.o.   MRN: [id number]   Chief Complaint  Patient presents with  . Diabetes  . Hypertension    HPI  He is here today for DM/HTN check. Reports compliance with medication. He reports compliance with healthy eating plan. 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   Diabetes He presents for his follow-up diabetic visit. He has type 2 diabetes mellitus. His disease course has been improving. There are no hypoglycemic associated symptoms. Pertinent negatives for diabetes include no blurred vision. There are no hypoglycemic complications. Risk factors for coronary artery disease include diabetes mellitus, hypertension and male sex. He is compliant with treatment most of the time. He is following a diabetic diet. He participates in exercise three times a week. His home blood glucose trend is fluctuating minimally. His breakfast blood glucose is taken between 7-8 am. His breakfast blood glucose range is generally 110-130 mg/dl. An ACE inhibitor/angiotensin II receptor blocker is being taken.  Hypertension This is a chronic problem. The current episode started more than 1 year ago. The problem has been gradually improving since onset. The problem is controlled. Pertinent negatives include no blurred vision. Past treatments  include ACE inhibitors and diuretics. The current treatment provides moderate improvement.     Past Medical History:  Diagnosis Date  . Diabetes mellitus type 2 in nonobese ([place]) 03/07/2020  . Diabetes mellitus without complication ([place])   . Exertional dyspnea 03/07/2020  . Hypertension   . Pure hypercholesterolemia 03/07/2020  . Tobacco abuse 03/07/2020     Family History  Problem Relation Age of Onset  . Diabetes Mother   . Diabetes Father   . High blood pressure Other   . Hypertension Brother   . Hypertension Sister   . Diabetes Sister      Current Outpatient Medications:  .  esomeprazole (NEXIUM) 40 MG capsule, Take 1 capsule by mouth daily, Disp: 90 capsule, Rfl: 1 .  Fenofibrate 150 MG CAPS, TAKE 1 CAPSULE BY MOUTH DAILY WITH FOOD, Disp: 30 capsule, Rfl: 3 .  fexofenadine (ALLEGRA) 180 MG tablet, Take 180 mg by mouth daily., Disp: , Rfl:  .  KOMBIGLYZE XR 11-998 MG TB24, TAKE 1 TABLET BY MOUTH DAILY WITH EVENING MEAL, Disp: 30 tablet, Rfl: 3 .  lisinopril-hydrochlorothiazide (ZESTORETIC) 20-12.5 MG tablet, TAKE 1 TABLET BY MOUTH ONCE DAILY, Disp: 90 tablet, Rfl: 1 .  traMADol (ULTRAM) 50 MG tablet, Take 1 tablet (50 mg total) by mouth every 6 (six) hours as needed., Disp: 20 tablet, Rfl: 0   No Known Allergies   Review of Systems  Constitutional: Negative.   Eyes: Negative for blurred vision.  Respiratory: Negative.   Cardiovascular: Negative.   Gastrointestinal: Negative.   Neurological: Negative.      Today's Vitals   04/23/20 0948  BP: 132/78  Pulse: 68  Temp: 98.3 F (36.8 C)  TempSrc: Oral  Weight: 181 lb (82.1 kg)  Height: 6' (1.829 m)   Body mass index is 24.55 kg/m.   Objective:  Physical Exam Vitals and nursing note reviewed.  Constitutional:      Appearance: Normal appearance.  Cardiovascular:     Rate and Rhythm: Normal rate and regular rhythm.     Heart sounds: Normal heart sounds.  Pulmonary:     Effort: Pulmonary effort is normal.      Breath sounds: Normal breath sounds.  Skin:    General: Skin is warm.  Neurological:     General: No focal deficit present.     Mental Status: He is alert.  Psychiatric:        Mood and Affect: Mood normal.         Assessment And Plan:     1. Uncontrolled type 2 diabetes mellitus with hyperglycemia (HCC) Comments: Chronic, I will check labs as listed below. I anticipate the need to add another medication to his current regimen, which he is hesitant to do. I will make further recommendations once his labs are available for review.   - Hemoglobin A1c - CMP14+EGFR - C-peptide  2. Essential hypertension, benign Comments: Chronic, fair control. He will continue with current meds. Encouraged to avoid adding salt to his foods.  - CMP14+EGFR  3. Pure hypercholesterolemia Comments: Previous LDL reviewed, 143, June 2021. He is advised goal LDL is less than 70. He is encouraged to consider statin therapy. I will make further recommendations once his labs are available for review. Encouraged to follow a heart healthy lifestyle.   4. Need for influenza vaccination Comments: He was given flu vaccine.  - Flu Vaccine QUAD 6+ mos PF IM (Fluarix Quad PF)     Patient was given opportunity to ask questions. Patient verbalized understanding of the plan and was able to repeat key elements of the plan. All questions were answered to their satisfaction.  [name], MD   I, [name], MD, have reviewed all documentation for this visit. The documentation on 04/23/20 for the exam, diagnosis, procedures, and orders are all accurate and complete.  THE PATIENT IS ENCOURAGED TO PRACTICE SOCIAL DISTANCING DUE TO THE COVID-19 PANDEMIC.

## 2020-04-24 LAB — CMP14+EGFR
ALT: 40 IU/L (ref 0–44)
AST: 22 IU/L (ref 0–40)
Albumin/Globulin Ratio: 2 (ref 1.2–2.2)
Albumin: 4.5 g/dL (ref 4.0–5.0)
Alkaline Phosphatase: 67 IU/L (ref 44–121)
BUN/Creatinine Ratio: 9 (ref 9–20)
BUN: 12 mg/dL (ref 6–24)
Bilirubin Total: 0.3 mg/dL (ref 0.0–1.2)
CO2: 24 mmol/L (ref 20–29)
Calcium: 10.1 mg/dL (ref 8.7–10.2)
Chloride: 104 mmol/L (ref 96–106)
Creatinine, Ser: 1.41 mg/dL — ABNORMAL HIGH (ref 0.76–1.27)
GFR calc Af Amer: 69 mL/min/{1.73_m2} (ref >59)
GFR calc non Af Amer: 59 mL/min/{1.73_m2} — ABNORMAL LOW (ref >59)
Globulin, Total: 2.3 g/dL (ref 1.5–4.5)
Glucose: 99 mg/dL (ref 65–99)
Potassium: 4.4 mmol/L (ref 3.5–5.2)
Sodium: 141 mmol/L (ref 134–144)
Total Protein: 6.8 g/dL (ref 6.0–8.5)

## 2020-04-24 LAB — HEMOGLOBIN A1C
Est. average glucose Bld gHb Est-mCnc: 146 mg/dL
Hgb A1c MFr Bld: 6.7 % — ABNORMAL HIGH (ref 4.8–5.6)

## 2020-04-24 LAB — C-PEPTIDE: C-Peptide: 2.2 ng/mL (ref 1.1–4.4)

## 2020-05-08 ENCOUNTER — Other Ambulatory Visit: Payer: Self-pay

## 2020-05-08 ENCOUNTER — Other Ambulatory Visit: Payer: Self-pay | Admitting: Internal Medicine

## 2020-05-08 MED ORDER — FENOFIBRATE 150 MG PO CAPS: ORAL_CAPSULE | ORAL | 3 refills | Status: DC

## 2020-05-08 MED ORDER — KOMBIGLYZE XR 5-1000 MG PO TB24: ORAL_TABLET | ORAL | 3 refills | Status: DC

## 2020-05-08 MED ORDER — LISINOPRIL-HYDROCHLOROTHIAZIDE 20-12.5 MG PO TABS: 1.0000 | ORAL_TABLET | Freq: Every day | ORAL | 1 refills | Status: DC

## 2020-05-08 MED FILL — FENOFIBRATE 150 MG CAPSULE: 150 | 30 days supply | Qty: 30 | Fill #0

## 2020-05-08 MED FILL — LISINOPRIL-HCTZ 20-12.5 MG: 20-12.5 | 90 days supply | Qty: 90 | Fill #0

## 2020-05-14 ENCOUNTER — Other Ambulatory Visit: Payer: Self-pay

## 2020-05-14 NOTE — Telephone Encounter (Signed)
Prior auth completed for the pt's kombiglyze.  Waiting on response from his insurance company.

## 2020-05-15 ENCOUNTER — Other Ambulatory Visit: Payer: Self-pay | Admitting: Nurse Practitioner

## 2020-05-15 ENCOUNTER — Other Ambulatory Visit: Payer: Self-pay | Admitting: Internal Medicine

## 2020-05-15 MED ORDER — TRAMADOL HCL 50 MG PO TABS
50.0000 mg | ORAL_TABLET | Freq: Four times a day (QID) | ORAL | 0 refills | Status: DC | PRN
Start: 2020-05-15 — End: 2020-05-15

## 2020-05-15 MED ORDER — TRAMADOL HCL 50 MG PO TABS: 50.0000 mg | ORAL_TABLET | Freq: Four times a day (QID) | ORAL | 0 refills | Status: DC | PRN

## 2020-05-15 MED FILL — traMADol HCL 50 MG TABS: 50 | 5 days supply | Qty: 20 | Fill #0

## 2020-05-15 NOTE — Telephone Encounter (Signed)
Electronic fill

## 2020-05-15 NOTE — Telephone Encounter (Signed)
Shoulder pain that he says your originally prescibed it for. He said that he doesn't have to take it often that's why its been a year.

## 2020-05-15 NOTE — Telephone Encounter (Signed)
done

## 2020-05-15 NOTE — Telephone Encounter (Signed)
Ul[place]

## 2020-05-16 ENCOUNTER — Telehealth: Payer: Self-pay

## 2020-05-16 NOTE — Telephone Encounter (Signed)
The pt was notified that the office has submitted a prior authorization to his insurance company for coverage of the [place] and that samples are up front for him to pickup.

## 2020-05-21 ENCOUNTER — Telehealth: Payer: Self-pay

## 2020-05-21 NOTE — Telephone Encounter (Signed)
The pt was told that Dr. Baird Cancer wanted him to come and get samples of Kombiglyze and that  his insurance wants him to try Janumet and Dr. Baird Cancer wants to know if the pt is willing to do so.  The pt said that his thing is that he wants to try and get of the meds.Marland Kitchen He is going to come and get the samples and think about switching.

## 2020-05-30 ENCOUNTER — Telehealth: Payer: Self-pay | Admitting: *Deleted

## 2020-05-30 NOTE — Telephone Encounter (Signed)
-----   Message from [place] sent at 05/30/2020 12:05 PM EST ----- Regarding: ct heart I call and spoke to Mr. Blancett he refused to schedule ct scan and states he do not want to do the exam at all.  Thanks, [place]

## 2020-06-03 NOTE — Telephone Encounter (Signed)
OK thank you 

## 2020-06-12 ENCOUNTER — Other Ambulatory Visit (HOSPITAL_COMMUNITY): Payer: Self-pay | Admitting: Gastroenterology

## 2020-06-12 ENCOUNTER — Telehealth: Payer: Self-pay

## 2020-06-12 MED FILL — FENOFIBRATE 150 MG CAPSULE: 150 | 30 days supply | Qty: 30 | Fill #1

## 2020-06-12 MED FILL — METHYLPREDNISOLONE 4 MG TBP: 4 | 6 days supply | Qty: 21 | Fill #0

## 2020-06-12 NOTE — Telephone Encounter (Signed)
I called pt to advise him that he is due for an eye exam he stated he is scheduled to have it on 06/20/20. YL,RMA

## 2020-06-20 DIAGNOSIS — H524 Presbyopia: Secondary | ICD-10-CM | POA: Diagnosis not present

## 2020-06-20 DIAGNOSIS — E119 Type 2 diabetes mellitus without complications: Secondary | ICD-10-CM | POA: Diagnosis not present

## 2020-06-20 LAB — HM DIABETES EYE EXAM

## 2020-06-24 MED FILL — METHYLPREDNISOLONE 4 MG TBP: 4 | 6 days supply | Qty: 21 | Fill #1

## 2020-06-25 ENCOUNTER — Encounter: Payer: Self-pay | Admitting: Internal Medicine

## 2020-07-08 ENCOUNTER — Telehealth: Payer: Self-pay

## 2020-07-08 NOTE — Telephone Encounter (Signed)
Patient notified samples of kombiglyze ready for pickup.

## 2020-07-17 MED FILL — FENOFIBRATE 150 MG CAPSULE: 150 | 30 days supply | Qty: 30 | Fill #2

## 2020-07-22 ENCOUNTER — Ambulatory Visit: Payer: 59 | Admitting: Internal Medicine

## 2020-08-06 ENCOUNTER — Other Ambulatory Visit: Payer: Self-pay

## 2020-08-06 MED ORDER — JANUMET XR 100-1000 MG PO TB24
ORAL_TABLET | ORAL | 1 refills | Status: DC
Start: 2020-08-06 — End: 2020-08-21

## 2020-08-06 MED FILL — JANUMET XR 100-1,000 MG TAB: 100-1000 | 30 days supply | Qty: 30 | Fill #0

## 2020-08-08 ENCOUNTER — Telehealth: Payer: Self-pay

## 2020-08-08 NOTE — Telephone Encounter (Signed)
Pt called stating he don't feel good on that new medication Janunet it makes him dizzy and causes sweats he stated he received samples before that worked pretty good. He does not remember the name of the samples but stated it wasn't  kombiglyze  Because that medication was expensive.

## 2020-08-13 MED FILL — LISINOPRIL-HCTZ 20-12.5 MG: 20-12.5 | 90 days supply | Qty: 90 | Fill #1

## 2020-08-13 MED FILL — KOMBIGLYZE XR 5-1,000 MG TA: 5-1000 | 30 days supply | Qty: 30 | Fill #0

## 2020-08-14 ENCOUNTER — Telehealth: Payer: Self-pay

## 2020-08-14 ENCOUNTER — Other Ambulatory Visit: Payer: Self-pay

## 2020-08-14 MED ORDER — FENOFIBRATE 150 MG PO CAPS: ORAL_CAPSULE | ORAL | 2 refills | Status: DC

## 2020-08-14 NOTE — Telephone Encounter (Signed)
[facility] called and said that the pt can get fenofibrate 160 mg for $5 and that the current dose he is on 150mg  is $98 for a 90 days supply. The pharmacy wants to know if the pt can be changed to the 160 mg dose because it's cheaper.

## 2020-08-14 NOTE — Telephone Encounter (Signed)
Yes, you can send fenofibrate 160 #90/1 refill

## 2020-08-15 ENCOUNTER — Other Ambulatory Visit: Payer: Self-pay

## 2020-08-15 ENCOUNTER — Other Ambulatory Visit: Payer: Self-pay | Admitting: Internal Medicine

## 2020-08-15 MED ORDER — FENOFIBRATE 160 MG PO TABS
160.0000 mg | ORAL_TABLET | Freq: Every day | ORAL | 1 refills | Status: DC
Start: 2020-08-15 — End: 2020-08-15

## 2020-08-15 MED FILL — FENOFIBRATE 160 MG TABLET: 160 | 90 days supply | Qty: 90 | Fill #0

## 2020-08-21 ENCOUNTER — Encounter: Payer: Self-pay | Admitting: Internal Medicine

## 2020-08-21 ENCOUNTER — Other Ambulatory Visit: Payer: Self-pay | Admitting: Internal Medicine

## 2020-08-21 ENCOUNTER — Other Ambulatory Visit: Payer: Self-pay

## 2020-08-21 ENCOUNTER — Ambulatory Visit: Payer: 59 | Admitting: Internal Medicine

## 2020-08-21 VITALS — BP 120/74 | HR 73 | Temp 98.1°F | Ht 72.0 in | Wt 182.8 lb

## 2020-08-21 DIAGNOSIS — I10 Essential (primary) hypertension: Secondary | ICD-10-CM

## 2020-08-21 DIAGNOSIS — E7800 Pure hypercholesterolemia, unspecified: Secondary | ICD-10-CM

## 2020-08-21 DIAGNOSIS — E1165 Type 2 diabetes mellitus with hyperglycemia: Secondary | ICD-10-CM | POA: Diagnosis not present

## 2020-08-21 MED ORDER — ESOMEPRAZOLE MAGNESIUM 40 MG PO CPDR: DELAYED_RELEASE_CAPSULE | ORAL | 1 refills | Status: DC

## 2020-08-21 MED ORDER — FLUTICASONE PROPIONATE 50 MCG/ACT NA SUSP: 2.0000 | Freq: Every day | NASAL | 1 refills | Status: DC

## 2020-08-21 MED FILL — ESOMEPRAZOLE MAG DR 40 MG C: 40 | 90 days supply | Qty: 90 | Fill #0

## 2020-08-21 NOTE — Progress Notes (Signed)
I,Katawbba Wiggins,acting as a Education administrator for [name], MD.,have documented all relevant documentation on the behalf of [name], MD,as directed by  [name], MD while in the presence of [name], MD.  This visit occurred during the SARS-CoV-2 public health emergency.  Safety protocols were in place, including screening questions prior to the visit, additional usage of staff PPE, and extensive cleaning of exam room while observing appropriate contact time as indicated for disinfecting solutions.  Subjective:     Patient ID: Adam Wiley , male    DOB: 10-Mar-1976 , 45 y.o.   MRN: [id number]   Chief Complaint  Patient presents with  . Diabetes  . Hypertension    HPI  He is here today for DM/HTN check.  The patient said that when took the Jardiance it caused him to have hot flashes and dizzy.  The pt said he feels betters since stopping the new med and restarting Kombiglyze. He was able to fill the rx at the pharmacy because he had a coupon.   Diabetes He presents for his follow-up diabetic visit. He has type 2 diabetes mellitus. His disease course has been improving. There are no hypoglycemic associated symptoms. Pertinent negatives for diabetes include no blurred vision. There are no hypoglycemic complications. Risk factors for coronary artery disease include diabetes mellitus, hypertension and male sex. He is compliant with treatment most of the time. He is following a diabetic diet. He participates in exercise three times a week. His home blood glucose trend is fluctuating minimally. His breakfast blood glucose is taken between 7-8 am. His breakfast blood glucose range is generally 110-130 mg/dl. An ACE inhibitor/angiotensin II receptor blocker is being taken.  Hypertension This is a chronic problem. The current episode started more than 1 year ago. The problem has been gradually improving since onset. The problem is controlled. Pertinent negatives include no blurred  vision. Past treatments include ACE inhibitors and diuretics. The current treatment provides moderate improvement.     Past Medical History:  Diagnosis Date  . Diabetes mellitus type 2 in nonobese ([place]) 03/07/2020  . Diabetes mellitus without complication ([place])   . Exertional dyspnea 03/07/2020  . Hypertension   . Pure hypercholesterolemia 03/07/2020  . Tobacco abuse 03/07/2020     Family History  Problem Relation Age of Onset  . Diabetes Mother   . Diabetes Father   . High blood pressure Other   . Hypertension Brother   . Hypertension Sister   . Diabetes Sister      Current Outpatient Medications:  .  fenofibrate 160 MG tablet, Take 1 tablet (160 mg total) by mouth daily., Disp: 90 tablet, Rfl: 1 .  fexofenadine (ALLEGRA) 180 MG tablet, Take 180 mg by mouth daily., Disp: , Rfl:  .  Saxagliptin-Metformin (KOMBIGLYZE XR) 11-998 MG TB24, Take 1 tablet by mouth daily., Disp: , Rfl:  .  esomeprazole (NEXIUM) 40 MG capsule, Take 1 capsule by mouth daily, Disp: 90 capsule, Rfl: 1 .  fluticasone (FLONASE) 50 MCG/ACT nasal spray, Place 2 sprays into both nostrils daily., Disp: 16 g, Rfl: 1 .  lisinopril-hydrochlorothiazide (ZESTORETIC) 20-12.5 MG tablet, Take 1 tablet by mouth daily., Disp: 90 tablet, Rfl: 1 .  traMADol (ULTRAM) 50 MG tablet, Take 1 tablet (50 mg total) by mouth every 6 (six) hours as needed., Disp: 20 tablet, Rfl: 0   No Known Allergies   Review of Systems  Constitutional: Negative.   Eyes: Negative for blurred vision.  Respiratory: Negative.  Cardiovascular: Negative.   Gastrointestinal: Negative.   Psychiatric/Behavioral: Negative.   All other systems reviewed and are negative.    Today's Vitals   08/21/20 1135  BP: 120/74  Pulse: 73  Temp: 98.1 F (36.7 C)  TempSrc: Oral  Weight: 182 lb 12.8 oz (82.9 kg)  Height: 6' (1.829 m)   Body mass index is 24.79 kg/m.  Wt Readings from Last 3 Encounters:  08/28/20 185 lb 3.2 oz (84 kg)  08/21/20 182 lb 12.8  oz (82.9 kg)  04/23/20 181 lb (82.1 kg)   Objective:  Physical Exam Vitals and nursing note reviewed.  Constitutional:      Appearance: Normal appearance.  HENT:     Head: Normocephalic and atraumatic.     Nose:     Comments: Masked     Mouth/Throat:     Comments: Masked  Cardiovascular:     Rate and Rhythm: Normal rate and regular rhythm.     Heart sounds: Normal heart sounds.  Pulmonary:     Breath sounds: Normal breath sounds.  Musculoskeletal:     Cervical back: Normal range of motion.  Skin:    General: Skin is warm.  Neurological:     General: No focal deficit present.     Mental Status: He is alert and oriented to person, place, and time.         Assessment And Plan:     1. Uncontrolled type 2 diabetes mellitus with hyperglycemia ([place]) Comments: Chronic, he feels better on Kombliglyze. I will check labs and adjust meds as needed. I would like for him to consider Farxiga.  - Hemoglobin A1c - BMP8+EGFR  2. Essential hypertension, benign Comments: Chronic, well controlled. He will c/w lisinopril/hctz. He is encouraged to avoid adding salt to his foods.   3. Pure hypercholesterolemia Comments: He is currently taking fenofibrate. He agrees to  switch to statin once he runs out of current meds. He is reminded to let me know when he runs out of meds.     Patient was given opportunity to ask questions. Patient verbalized understanding of the plan and was able to repeat key elements of the plan. All questions were answered to their satisfaction.   I, [name], MD, have reviewed all documentation for this visit. The documentation on 09/01/20 for the exam, diagnosis, procedures, and orders are all accurate and complete.  THE PATIENT IS ENCOURAGED TO PRACTICE SOCIAL DISTANCING DUE TO THE COVID-19 PANDEMIC.

## 2020-08-22 LAB — HEMOGLOBIN A1C
Est. average glucose Bld gHb Est-mCnc: 177 mg/dL
Hgb A1c MFr Bld: 7.8 % — ABNORMAL HIGH (ref 4.8–5.6)

## 2020-08-22 LAB — BMP8+EGFR
BUN/Creatinine Ratio: 10 (ref 9–20)
BUN: 13 mg/dL (ref 6–24)
CO2: 22 mmol/L (ref 20–29)
Calcium: 10 mg/dL (ref 8.7–10.2)
Chloride: 104 mmol/L (ref 96–106)
Creatinine, Ser: 1.24 mg/dL (ref 0.76–1.27)
GFR calc Af Amer: 80 mL/min/{1.73_m2} (ref >59)
GFR calc non Af Amer: 69 mL/min/{1.73_m2} (ref >59)
Glucose: 186 mg/dL — ABNORMAL HIGH (ref 65–99)
Potassium: 5 mmol/L (ref 3.5–5.2)
Sodium: 141 mmol/L (ref 134–144)

## 2020-08-27 ENCOUNTER — Telehealth: Payer: Self-pay | Admitting: Cardiovascular Disease

## 2020-08-27 NOTE — Telephone Encounter (Signed)
Spoke with patient of Dr. Oval Linsey who is asking for testing to be done to check his heart. He was seen in August - cor CTA was ordered, not completed d/t cost. He reports non-specific chest pain for a few days. Scheduled him visit for tomorrow 08/28/20 @ 0945 with Denyse Amass NP

## 2020-08-27 NOTE — Progress Notes (Signed)
Cardiology Clinic Note   Patient Name: Adam Wiley Date of Encounter: 08/28/2020  Primary Care Provider:  Glendale Chard, MD Primary Cardiologist:  Skeet Latch, MD  Patient Profile    Adam Wiley 45 year old male presents the clinic for an evaluation of his chest pain.  Past Medical History    Past Medical History:  Diagnosis Date  . Diabetes mellitus type 2 in nonobese ([place]) 03/07/2020  . Diabetes mellitus without complication ([place])   . Exertional dyspnea 03/07/2020  . Hypertension   . Pure hypercholesterolemia 03/07/2020  . Tobacco abuse 03/07/2020   History reviewed. No pertinent surgical history.  Allergies  No Known Allergies  History of Present Illness    Mr. Riker is a PMH of essential hypertension, uncontrolled type 2 diabetes, exertional dyspnea, hypercholesterolemia, and tobacco abuse.  He was seen by Dr. Oval Linsey 03/07/2020.  He reported he had been running a few months prior and noted some shortness of breath.  He denied chest pain at the time.  He reported that he exercises multiple times a week and had no chest pain with exertion.  He reported that the shortness of breath lasted for about 30 seconds and was not associated with chest pain, lightheadedness, or dizziness.  He denied lower extremity edema.  At the time of the visit he denied orthopnea PND.  He reported that he did snore but did not feel like he had apnea.  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  He reported drinking 1 cup of coffee daily, denied alcohol use, occasionally smokes 1 cigar, and previously smoked cigarettes.  He does not currently smoke.  He was diagnosed with diabetes 10 years ago.  He reported that his sister died from complications of diabetes very young.  He reported that when he checks his blood pressure it typically runs in the 120s-130s over 70s.  He was trying to eat a healthy diet and was limiting his sodium intake.   However, he did state that he used [place] salt and sea salt.  A coronary CTA with FFR was ordered but was not completed due to cost.  Contacted nurse triage line on 08/27/2020 and indicated that he was having intermittent periods of chest pain for the last few days.  CTA previously ordered but was not completed due to cost.  He presents the clinic today and states 2 weeks ago he noted periods of chest discomfort along his left upper chest.  He reports the episodes are sharp and lasts for about 5 minutes.  The episodes dissipated with rest.  He reports he continues to stay physically active working out 4 times per week doing activities such as running for 30 minutes, and lifting weights.  He reports that 2 weeks ago he started back with his lifting program.  However he does not feel this is related to muscular pain as he is accustomed to muscle soreness.  He also notes occasional episodes of shortness of breath both with and without chest pain.  He denies dyspnea with increased physical activity and running.  We discussed the previously ordered coronary CTA and diet.  He agrees to proceed with coronary CTA and continues to eat a heart healthy diet.  I will give him the salty 6 diet sheet, have him maintain his physical activity, and follow-up with Dr. Oval Linsey after testing.  Today he denies chest pain, shortness of breath, lower extremity edema, fatigue, palpitations, melena,  hematuria, hemoptysis, diaphoresis, weakness, presyncope, syncope, orthopnea, and PND.   Home Medications    Prior to Admission medications   Medication Sig Start Date End Date Taking? Authorizing Provider  esomeprazole (NEXIUM) 40 MG capsule Take 1 capsule by mouth daily 08/21/20   Glendale Chard, MD  fenofibrate 160 MG tablet Take 1 tablet (160 mg total) by mouth daily. 08/15/20   Glendale Chard, MD  fexofenadine (ALLEGRA) 180 MG tablet Take 180 mg by mouth daily.    [provider]  fluticasone (FLONASE) 50 MCG/ACT  nasal spray Place 2 sprays into both nostrils daily. 08/21/20   Glendale Chard, MD  lisinopril-hydrochlorothiazide (ZESTORETIC) 20-12.5 MG tablet Take 1 tablet by mouth daily. 05/08/20   Glendale Chard, MD  Saxagliptin-Metformin (KOMBIGLYZE XR) 11-998 MG TB24 Take 1 tablet by mouth daily. 05/08/20   [provider]  traMADol (ULTRAM) 50 MG tablet Take 1 tablet (50 mg total) by mouth every 6 (six) hours as needed. 05/15/20   [name], FNP    Family History    Family History  Problem Relation Age of Onset  . Diabetes Mother   . Diabetes Father   . High blood pressure Other   . Hypertension Brother   . Hypertension Sister   . Diabetes Sister    He indicated that his mother is alive. He indicated that his father is alive. He indicated that three of his four sisters are alive. He indicated that his brother is alive. He indicated that the status of his other is unknown.  Social History    Social History   Socioeconomic History  . Marital status: Married    Spouse name: Not on file  . Number of children: Not on file  . Years of education: Not on file  . Highest education level: Not on file  Occupational History  . Not on file  Tobacco Use  . Smoking status: Former Smoker    Packs/day: 0.25    Years: 5.00    Pack years: 1.25    Types: Cigarettes    Quit date: 2022    Years since quitting: 0.1  . Smokeless tobacco: Never Used  Vaping Use  . Vaping Use: Never used  Substance and Sexual Activity  . Alcohol use: Yes    Comment: occasionally  . Drug use: No  . Sexual activity: Yes    Birth control/protection: Condom, None  Other Topics Concern  . Not on file  Social History Narrative  . Not on file   Social Determinants of Health   Financial Resource Strain: Not on file  Food Insecurity: Not on file  Transportation Needs: Not on file  Physical Activity: Not on file  Stress: Not on file  Social Connections: Not on file  Intimate Partner Violence: Not on file      Review of Systems    General:  No chills, fever, night sweats or weight changes.  Cardiovascular:  No chest pain, dyspnea on exertion, edema, orthopnea, palpitations, paroxysmal nocturnal dyspnea. Dermatological: No rash, lesions/masses Respiratory: No cough, dyspnea Urologic: No hematuria, dysuria Abdominal:   No nausea, vomiting, diarrhea, bright red blood per rectum, melena, or hematemesis Neurologic:  No visual changes, wkns, changes in mental status. All other systems reviewed and are otherwise negative except as noted above.  Physical Exam    VS:  BP (!) 146/88 (BP Location: Left Arm, Patient Position: Sitting)   Pulse (!) 58   Ht 5\' 11"  (1.803 m)   Wt 185 lb 3.2 oz (84  kg)   SpO2 97%   BMI 25.83 kg/m  , BMI Body mass index is 25.83 kg/m. GEN: Well nourished, well developed, in no acute distress. HEENT: normal. Neck: Supple, no JVD, carotid bruits, or masses. Cardiac: RRR, no murmurs, rubs, or gallops. No clubbing, cyanosis, edema.  Radials/DP/PT 2+ and equal bilaterally.  Respiratory:  Respirations regular and unlabored, clear to auscultation bilaterally. GI: Soft, nontender, nondistended, BS + x 4. MS: no deformity or atrophy. Skin: warm and dry, no rash. Neuro:  Strength and sensation are intact. Psych: Normal affect.  Accessory Clinical Findings    Recent Labs: 12/19/2019: Hemoglobin 14.1; Platelets 324 04/23/2020: ALT 40 08/21/2020: BUN 13; Creatinine, Ser 1.24; Potassium 5.0; Sodium 141   Recent Lipid Panel    Component Value Date/Time   CHOL 218 (H) 12/19/2019 1138   TRIG 196 (H) 12/19/2019 1138   HDL 39 (L) 12/19/2019 1138   CHOLHDL 5.6 (H) 12/19/2019 1138   LDLCALC 143 (H) 12/19/2019 1138    ECG personally reviewed by me today-sinus bradycardia no ST or T wave deviation 58 bpm  EKG 12/19/2019 Sinus rhythm 64 bpm no ST or T wave deviation.  Assessment & Plan   1.  Precordial chest pain dyspnea -no chest pain today.  Reports intermittent episodes  of chest discomfort at rest.  Denies chest discomfort with exertion.  Coronary CTA with FFR was previously ordered but not completed. Recommend coronary CTA-reviewed medical schedule. Heart healthy low-sodium diet Maintain physical activity  Essential hypertension-BP today 146/88.  Well-controlled at home 120s-130s over 80s. Continue lisinopril, hydrochlorothiazide Heart healthy low-sodium diet-salty 6 given Increase physical activity as tolerated  Pure hypercholesterolemia-12/19/2019: Cholesterol, Total 218; HDL 39; LDL Chol Calc (NIH) 143; Triglycerides 196 Continue fenofibrate Heart healthy low-sodium high-fiber diet Increase physical activity as tolerated  Type 2 diabetes-hemoglobin A1c 7.8 on 08/21/2020. Continue Kombiglyze Follows with PCP  Disposition: Follow-up with Dr. Oval Linsey after coronary CTA.  Jossie Ng. Famous Eisenhardt NP-C    08/28/2020, 10:21 AM [address] [place] [address] Office [phone] Fax [phone]  Notice: This dictation was prepared with Dragon dictation along with smaller phrase technology. Any transcriptional errors that result from this process are unintentional and may not be corrected upon review.  I spent 15 minutes examining this patient, reviewing medications, and using patient centered shared decision making involving her cardiac care.  Prior to her visit I spent greater than 20 minutes reviewing her past medical history,  medications, and prior cardiac tests.

## 2020-08-27 NOTE — Telephone Encounter (Signed)
Patient states he would like to have a test completed to ensure that his heart is functioning properly. Patient currently has CT orders placed, but no orders for additional testing. Patient is unsure what type of testing he should have, but he requested to speak with Dr. Blenda Mounts nurse to determine what may be needed. I made the patient aware Dr. Blenda Mounts nurse is not in office today, but she will return call when available.

## 2020-08-28 ENCOUNTER — Other Ambulatory Visit: Payer: Self-pay

## 2020-08-28 ENCOUNTER — Encounter: Payer: Self-pay | Admitting: General Practice

## 2020-08-28 ENCOUNTER — Ambulatory Visit (INDEPENDENT_AMBULATORY_CARE_PROVIDER_SITE_OTHER): Payer: 59 | Admitting: General Practice

## 2020-08-28 VITALS — BP 146/88 | HR 58 | Ht 71.0 in | Wt 185.2 lb

## 2020-08-28 DIAGNOSIS — E1165 Type 2 diabetes mellitus with hyperglycemia: Secondary | ICD-10-CM

## 2020-08-28 DIAGNOSIS — R0600 Dyspnea, unspecified: Secondary | ICD-10-CM

## 2020-08-28 DIAGNOSIS — I10 Essential (primary) hypertension: Secondary | ICD-10-CM

## 2020-08-28 DIAGNOSIS — R072 Precordial pain: Secondary | ICD-10-CM | POA: Diagnosis not present

## 2020-08-28 DIAGNOSIS — R0609 Other forms of dyspnea: Secondary | ICD-10-CM

## 2020-08-28 DIAGNOSIS — E7800 Pure hypercholesterolemia, unspecified: Secondary | ICD-10-CM

## 2020-08-28 NOTE — Patient Instructions (Addendum)
Medication Instructions:  The current medical regimen is effective;  continue present plan and medications as directed. Please refer to the Current Medication list given to you today.  *If you need a refill on your cardiac medications before your next appointment, please call your pharmacy*  Lab Work:   Testing/Procedures:  NONE    NONE  Special Instructions PLEASE SCHEDULE CT  PLEASE READ AND FOLLOW SALTY 6-ATTACHED-1,800mg  daily  Follow-Up: Your next appointment:  AFTER CT  In Person with Skeet Latch, MD OR IF UNAVAILABLE [place], FNP-C  At [facility], you and your health needs are our priority.  As part of our continuing mission to provide you with exceptional heart care, we have created designated Provider Care Teams.  These Care Teams include your primary Cardiologist (physician) and Advanced Practice Providers (APPs -  Physician Assistants and Nurse Practitioners) who all work together to provide you with the care you need, when you need it.            6 SALTY THINGS TO AVOID     1,800MG  DAILY    Your cardiac CT will be scheduled at one of the below locations:   [facility] [address] [place], [place] [address] (336) [place] [address] [place], [place] [address] [phone]  If scheduled at [facility], please arrive at the [facility] main entrance (entrance A) of [facility] 30 minutes prior to test start time. Proceed to the [facility] Radiology Department (first floor) to check-in and test prep.  If scheduled at [facility], please arrive 15 mins early for check-in and test prep.  Please follow these instructions carefully (unless otherwise directed):  Hold all erectile dysfunction medications at least 3 days (72 hrs) prior to test.  On the Night Before the Test: . Be sure to Drink plenty of water. . Do not consume any  caffeinated/decaffeinated beverages or chocolate 12 hours prior to your test. . Do not take any antihistamines 12 hours prior to your test.  On the Day of the Test: . Drink plenty of water until 1 hour prior to the test. . Do not eat any food 4 hours prior to the test. . You may take your regular medications prior to the test.  . Take metoprolol (Lopressor) two hours prior to test. . HOLD Furosemide/Hydrochlorothiazide morning of the test. . FEMALES- please wear underwire-free bra if available      After the Test: . Drink plenty of water. . After receiving IV contrast, you may experience a mild flushed feeling. This is normal. . On occasion, you may experience a mild rash up to 24 hours after the test. This is not dangerous. If this occurs, you can take Benadryl 25 mg and increase your fluid intake. . If you experience trouble breathing, this can be serious. If it is severe call 911 IMMEDIATELY. If it is mild, please call our office. . If you take any of these medications: Glipizide/Metformin, Avandament, Glucavance, please do not take 48 hours after completing test unless otherwise instructed.   Once we have confirmed authorization from your insurance company, we will call you to set up a date and time for your test. Based on how quickly your insurance processes prior authorizations requests, please allow up to 4 weeks to be contacted for scheduling your Cardiac CT appointment. Be advised that routine Cardiac CT appointments could be scheduled as many as 8  weeks after your provider has ordered it.  For non-scheduling related questions, please contact the cardiac imaging nurse navigator should you have any questions/concerns: Marchia Bond, Cardiac Imaging Nurse Navigator Gordy Clement, Cardiac Imaging Nurse Navigator  Heart and V[facility] Services Direct Office Dial: [phone]   For scheduling needs, including cancellations and rescheduling, please call [place],  [phone].

## 2020-09-02 ENCOUNTER — Telehealth (HOSPITAL_COMMUNITY): Payer: Self-pay | Admitting: Emergency Medicine

## 2020-09-02 NOTE — Telephone Encounter (Signed)
Attempted to call patient regarding upcoming cardiac CT appointment. °Left message on voicemail with name and callback number °Aminta Sakurai RN Navigator Cardiac Imaging °[facility] °[phone] Office °[phone] Cell ° °

## 2020-09-04 ENCOUNTER — Other Ambulatory Visit: Payer: Self-pay

## 2020-09-04 ENCOUNTER — Ambulatory Visit (HOSPITAL_COMMUNITY)
Admission: RE | Admit: 2020-09-04 | Discharge: 2020-09-04 | Disposition: A | Discharge status: Home / Self Care | Payer: 59 | Source: Ambulatory Visit | Attending: Cardiovascular Disease | Admitting: Cardiovascular Disease

## 2020-09-04 DIAGNOSIS — R0609 Other forms of dyspnea: Secondary | ICD-10-CM

## 2020-09-04 DIAGNOSIS — R0600 Dyspnea, unspecified: Secondary | ICD-10-CM | POA: Diagnosis not present

## 2020-09-04 DIAGNOSIS — I10 Essential (primary) hypertension: Secondary | ICD-10-CM | POA: Insufficient documentation

## 2020-09-04 DIAGNOSIS — Z8249 Family history of ischemic heart disease and other diseases of the circulatory system: Secondary | ICD-10-CM | POA: Diagnosis not present

## 2020-09-04 DIAGNOSIS — E1165 Type 2 diabetes mellitus with hyperglycemia: Secondary | ICD-10-CM | POA: Insufficient documentation

## 2020-09-04 DIAGNOSIS — Z006 Encounter for examination for normal comparison and control in clinical research program: Secondary | ICD-10-CM

## 2020-09-04 MED ORDER — NITROGLYCERIN 0.4 MG SL SUBL
SUBLINGUAL_TABLET | SUBLINGUAL | Status: AC
  Administered 2020-09-04: 0.8 mg via SUBLINGUAL
  Filled 2020-09-04: qty 2

## 2020-09-04 MED ORDER — NITROGLYCERIN 0.4 MG SL SUBL: 0.8000 mg | SUBLINGUAL_TABLET | Freq: Once | SUBLINGUAL | Status: AC

## 2020-09-04 MED ORDER — IOHEXOL 350 MG/ML SOLN
80.0000 mL | Freq: Once | INTRAVENOUS | Status: AC | PRN
  Administered 2020-09-04: 80 mL via INTRAVENOUS

## 2020-09-04 NOTE — Research (Signed)
IDENTIFY Informed Consent                  Subject Name: Adam Wiley    Subject met inclusion and exclusion criteria.  The informed consent form, study requirements and expectations were reviewed with the subject and questions and concerns were addressed prior to the signing of the consent form.  The subject verbalized understanding of the trial requirements.  The subject agreed to participate in the IDENTIFY trial and signed the informed consent at 11:15AM on 09/04/20.  The informed consent was obtained prior to performance of any protocol-specific procedures for the subject.  A copy of the signed informed consent was given to the subject and a copy was placed in the subject's medical record.   Meade Maw, Naval architect

## 2020-09-13 MED FILL — KOMBIGLYZE XR 5-1,000 MG TA: 5-1000 | 30 days supply | Qty: 30 | Fill #1

## 2020-10-12 ENCOUNTER — Other Ambulatory Visit (HOSPITAL_COMMUNITY): Payer: Self-pay

## 2020-10-17 MED FILL — Saxagliptin-Metformin HCl Tab ER 24HR 5-1000 MG: ORAL | 30 days supply | Qty: 30 | Fill #0 | Status: AC

## 2020-10-18 ENCOUNTER — Other Ambulatory Visit: Payer: Self-pay

## 2020-10-18 ENCOUNTER — Other Ambulatory Visit (HOSPITAL_COMMUNITY): Payer: Self-pay

## 2020-10-22 ENCOUNTER — Ambulatory Visit: Payer: 59 | Admitting: Nurse Practitioner

## 2020-10-22 ENCOUNTER — Other Ambulatory Visit: Payer: Self-pay

## 2020-10-22 ENCOUNTER — Other Ambulatory Visit (HOSPITAL_COMMUNITY)
Admission: RE | Admit: 2020-10-22 | Discharge: 2020-10-22 | Disposition: A | Discharge status: Home / Self Care | Payer: 59 | Source: Ambulatory Visit | Attending: Nurse Practitioner | Admitting: Nurse Practitioner

## 2020-10-22 ENCOUNTER — Encounter: Payer: Self-pay | Admitting: Nurse Practitioner

## 2020-10-22 ENCOUNTER — Other Ambulatory Visit (HOSPITAL_COMMUNITY): Payer: Self-pay

## 2020-10-22 VITALS — BP 124/80 | HR 88 | Temp 98.1°F | Ht 71.0 in | Wt 185.8 lb

## 2020-10-22 DIAGNOSIS — R1030 Lower abdominal pain, unspecified: Secondary | ICD-10-CM

## 2020-10-22 DIAGNOSIS — Z7689 Persons encountering health services in other specified circumstances: Secondary | ICD-10-CM | POA: Diagnosis not present

## 2020-10-22 DIAGNOSIS — Z113 Encounter for screening for infections with a predominantly sexual mode of transmission: Secondary | ICD-10-CM | POA: Diagnosis not present

## 2020-10-22 DIAGNOSIS — N341 Nonspecific urethritis: Secondary | ICD-10-CM

## 2020-10-22 DIAGNOSIS — Z202 Contact with and (suspected) exposure to infections with a predominantly sexual mode of transmission: Secondary | ICD-10-CM

## 2020-10-22 LAB — POCT URINALYSIS DIPSTICK
Bilirubin, UA: NEGATIVE
Blood, UA: NEGATIVE
Glucose, UA: NEGATIVE
Ketones, UA: NEGATIVE
Leukocytes, UA: NEGATIVE
Nitrite, UA: NEGATIVE
Protein, UA: NEGATIVE
Spec Grav, UA: 1.02 (ref 1.010–1.025)
Urobilinogen, UA: 0.2 E.U./dL
pH, UA: 8.5 — AB (ref 5.0–8.0)

## 2020-10-22 MED ORDER — FLUCONAZOLE 100 MG PO TABS
100.0000 mg | ORAL_TABLET | ORAL | 0 refills | Status: DC
  Filled 2020-10-22: qty 2, 5d supply, fill #0

## 2020-10-22 NOTE — Progress Notes (Signed)
I,Dawnell Bryant,acting as a Education administrator for [name], FNP.,have documented all relevant documentation on the behalf of [name], FNP,as directed by  [name], FNP while in the presence of [name], [place]. This visit occurred during the SARS-CoV-2 public health emergency.  Safety protocols were in place, including screening questions prior to the visit, additional usage of staff PPE, and extensive cleaning of exam room while observing appropriate contact time as indicated for disinfecting solutions.  Subjective:     Patient ID: Adam Wiley , male    DOB: 05/07/1976 , 45 y.o.   MRN: [id number]   Chief Complaint  Patient presents with  . Abdominal Pain    HPI  Pt is here today for stomach pains, started 4 days ago. He also states it is down in his groin area. He also stated this pain has taken place before in the same areas.   Diabetes He presents for his follow-up diabetic visit. He has type 2 diabetes mellitus. His disease course has been improving. There are no hypoglycemic associated symptoms. Pertinent negatives for diabetes include no blurred vision. There are no hypoglycemic complications. Risk factors for coronary artery disease include diabetes mellitus, hypertension and male sex. He is compliant with treatment most of the time. He is following a diabetic diet. He participates in exercise three times a week. His home blood glucose trend is fluctuating minimally. His breakfast blood glucose is taken between 7-8 am. His breakfast blood glucose range is generally 110-130 mg/dl. An ACE inhibitor/angiotensin II receptor blocker is being taken.  Hypertension This is a chronic problem. The current episode started more than 1 year ago. The problem has been gradually improving since onset. The problem is controlled. Pertinent negatives include no blurred vision. Past treatments include ACE inhibitors and diuretics. The current treatment provides moderate improvement.     Past Medical  History:  Diagnosis Date  . Diabetes mellitus type 2 in nonobese ([place]) 03/07/2020  . Diabetes mellitus without complication ([place])   . Exertional dyspnea 03/07/2020  . Hypertension   . Pure hypercholesterolemia 03/07/2020  . Tobacco abuse 03/07/2020     Family History  Problem Relation Age of Onset  . Diabetes Mother   . Diabetes Father   . High blood pressure Other   . Hypertension Brother   . Hypertension Sister   . Diabetes Sister      Current Outpatient Medications:  .  esomeprazole (NEXIUM) 40 MG capsule, TAKE 1 CAPSULE BY MOUTH DAILY, Disp: 90 capsule, Rfl: 1 .  fenofibrate 160 MG tablet, TAKE 1 TABLET (160 MG TOTAL) BY MOUTH DAILY., Disp: 90 tablet, Rfl: 1 .  fexofenadine (ALLEGRA) 180 MG tablet, Take 180 mg by mouth daily., Disp: , Rfl:  .  fluconazole (DIFLUCAN) 100 MG tablet, Take 1 tablet by mouth now, repeat in 5 days, Disp: 2 tablet, Rfl: 0 .  fluticasone (FLONASE) 50 MCG/ACT nasal spray, Place 2 sprays into both nostrils daily., Disp: 16 g, Rfl: 1 .  lisinopril-hydrochlorothiazide (ZESTORETIC) 20-12.5 MG tablet, TAKE 1 TABLET BY MOUTH DAILY., Disp: 90 tablet, Rfl: 1 .  methylPREDNISolone (MEDROL DOSEPAK) 4 MG TBPK tablet, TAKE AS DIRECTED ON PACKET FOR 6 DAYS (Patient taking differently: TAKE AS DIRECTED ON PACKET FOR 6 DAYS), Disp: 21 each, Rfl: 1 .  Saxagliptin-Metformin (KOMBIGLYZE XR) 11-998 MG TB24, Take 1 tablet by mouth daily., Disp: , Rfl:  .  Saxagliptin-Metformin 11-998 MG TB24, TAKE ONE TABLET BY MOUTH DAILY., Disp: 30 tablet, Rfl: 3 .  traMADol (ULTRAM) 50  MG tablet, TAKE 1 TABLET (50 MG TOTAL) BY MOUTH EVERY 6 (SIX) HOURS AS NEEDED., Disp: 20 tablet, Rfl: 0   No Known Allergies   Review of Systems  Constitutional: Negative.   HENT: Negative.   Eyes: Negative.  Negative for blurred vision.  Respiratory: Negative.   Cardiovascular: Negative.   Gastrointestinal: Positive for abdominal pain.  Endocrine: Negative.   Genitourinary: Negative.    Musculoskeletal: Negative.   Skin: Negative.   Allergic/Immunologic: Negative.   Neurological: Negative.   Hematological: Negative.   Psychiatric/Behavioral: Negative.      Today's Vitals   10/22/20 1037  BP: 124/80  Pulse: 88  Temp: 98.1 F (36.7 C)  Weight: 185 lb 12.8 oz (84.3 kg)  Height: 5\' 11"  (1.803 m)  PainSc: 0-No pain   Body mass index is 25.91 kg/m.  Wt Readings from Last 3 Encounters:  10/22/20 185 lb 12.8 oz (84.3 kg)  08/28/20 185 lb 3.2 oz (84 kg)  08/21/20 182 lb 12.8 oz (82.9 kg)   Objective:  Physical Exam Constitutional:      General: He is not in acute distress.    Appearance: Normal appearance. He is well-developed.  Cardiovascular:     Rate and Rhythm: Normal rate and regular rhythm.     Heart sounds: Normal heart sounds. No murmur heard.   Pulmonary:     Effort: Pulmonary effort is normal.  Abdominal:     Tenderness: There is abdominal tenderness in the suprapubic area.  Neurological:     General: No focal deficit present.     Mental Status: He is alert and oriented to person, place, and time.     Cranial Nerves: No cranial nerve deficit.  Psychiatric:        Mood and Affect: Mood normal.        Behavior: Behavior normal.        Thought Content: Thought content normal.        Judgment: Judgment normal.         Assessment And Plan:     1. Lower abdominal pain  Tenderness to suprabubic/bladder area  Will send urine for culture  Negative blood or leukocytes in urine - POCT Urinalysis Dipstick (81002)  2. Urethritis, nonspecific  Will send urine for culture - Culture, Urine  3. Encounter for assessment of STD exposure  4. Encounter for screening examination for sexually transmitted disease  Denies exposure  - Urine cytology ancillary only - HIV antibody (with reflex) - T pallidum Screening [place]     Patient was given opportunity to ask questions. Patient verbalized understanding of the plan and was able to repeat  key elements of the plan. All questions were answered to their satisfaction.  [name], FNP   I, [name], FNP, have reviewed all documentation for this visit. The documentation on 10/22/20 for the exam, diagnosis, procedures, and orders are all accurate and complete.   IF YOU HAVE BEEN REFERRED TO A SPECIALIST, IT MAY TAKE 1-2 WEEKS TO SCHEDULE/PROCESS THE REFERRAL. IF YOU HAVE NOT HEARD FROM US/SPECIALIST IN TWO WEEKS, PLEASE GIVE [place] A CALL AT [phone] X [phone].   THE PATIENT IS ENCOURAGED TO PRACTICE SOCIAL DISTANCING DUE TO THE COVID-19 PANDEMIC.

## 2020-10-23 LAB — HIV ANTIBODY (ROUTINE TESTING W REFLEX): HIV Screen 4th Generation wRfx: NONREACTIVE

## 2020-10-23 LAB — T PALLIDUM SCREENING CASCADE: T pallidum Antibodies (TP-PA): NONREACTIVE

## 2020-10-24 LAB — URINE CYTOLOGY ANCILLARY ONLY
Candida Urine: NEGATIVE
Chlamydia: NEGATIVE
Comment: NEGATIVE
Comment: NEGATIVE
Comment: NORMAL
Neisseria Gonorrhea: NEGATIVE
Trichomonas: NEGATIVE

## 2020-10-25 LAB — URINE CULTURE: Organism ID, Bacteria: NO GROWTH

## 2020-11-19 ENCOUNTER — Other Ambulatory Visit: Payer: Self-pay | Admitting: Internal Medicine

## 2020-11-19 MED FILL — Fenofibrate Tab 160 MG: ORAL | 90 days supply | Qty: 90 | Fill #0 | Status: AC

## 2020-11-19 MED FILL — Saxagliptin-Metformin HCl Tab ER 24HR 5-1000 MG: ORAL | 30 days supply | Qty: 30 | Fill #1 | Status: AC

## 2020-11-20 ENCOUNTER — Other Ambulatory Visit (HOSPITAL_COMMUNITY): Payer: Self-pay

## 2020-11-20 MED ORDER — LISINOPRIL-HYDROCHLOROTHIAZIDE 20-12.5 MG PO TABS
1.0000 | ORAL_TABLET | Freq: Every day | ORAL | 1 refills | Status: DC
  Filled 2020-11-20: qty 90, 90d supply, fill #0
  Filled 2021-02-24: qty 90, 90d supply, fill #1

## 2020-12-25 ENCOUNTER — Telehealth: Payer: Self-pay

## 2020-12-25 DIAGNOSIS — Z006 Encounter for examination for normal comparison and control in clinical research program: Secondary | ICD-10-CM

## 2020-12-25 NOTE — Telephone Encounter (Signed)
I have attempted without success to contact this patient by phone for his Identify 90 day follow up phone call. I left a message for patient to return my phone call with my name and callback number.

## 2020-12-26 ENCOUNTER — Encounter: Payer: 59 | Admitting: Internal Medicine

## 2020-12-27 ENCOUNTER — Other Ambulatory Visit: Payer: Self-pay | Admitting: Internal Medicine

## 2020-12-27 ENCOUNTER — Other Ambulatory Visit (HOSPITAL_COMMUNITY): Payer: Self-pay

## 2020-12-27 MED ORDER — KOMBIGLYZE XR 5-1000 MG PO TB24
1.0000 | ORAL_TABLET | Freq: Every day | ORAL | 3 refills | Status: DC
  Filled 2020-12-27: qty 30, 30d supply, fill #0
  Filled 2021-01-28: qty 30, 30d supply, fill #1
  Filled 2021-03-04: qty 30, 30d supply, fill #2
  Filled 2021-04-08: qty 30, 30d supply, fill #3

## 2020-12-31 ENCOUNTER — Telehealth: Payer: Self-pay

## 2020-12-31 DIAGNOSIS — Z006 Encounter for examination for normal comparison and control in clinical research program: Secondary | ICD-10-CM

## 2020-12-31 NOTE — Telephone Encounter (Signed)
I called patient for his 90-day Identify Study follow up phone call. Patient is doing well with no cardiac symptoms at this time. I reminded patient I would call him in February for his 1 year follow-up.

## 2021-01-29 ENCOUNTER — Other Ambulatory Visit (HOSPITAL_COMMUNITY): Payer: Self-pay

## 2021-02-24 ENCOUNTER — Other Ambulatory Visit (HOSPITAL_COMMUNITY): Payer: Self-pay

## 2021-03-04 ENCOUNTER — Other Ambulatory Visit (HOSPITAL_COMMUNITY): Payer: Self-pay

## 2021-03-20 ENCOUNTER — Ambulatory Visit (INDEPENDENT_AMBULATORY_CARE_PROVIDER_SITE_OTHER): Payer: 59 | Admitting: Nurse Practitioner

## 2021-03-20 ENCOUNTER — Other Ambulatory Visit: Payer: Self-pay

## 2021-03-20 ENCOUNTER — Encounter: Payer: Self-pay | Admitting: Nurse Practitioner

## 2021-03-20 VITALS — BP 138/80 | HR 89 | Temp 98.1°F | Ht 71.0 in | Wt 183.0 lb

## 2021-03-20 DIAGNOSIS — I10 Essential (primary) hypertension: Secondary | ICD-10-CM

## 2021-03-20 DIAGNOSIS — E7800 Pure hypercholesterolemia, unspecified: Secondary | ICD-10-CM

## 2021-03-20 DIAGNOSIS — Z0000 Encounter for general adult medical examination without abnormal findings: Secondary | ICD-10-CM | POA: Diagnosis not present

## 2021-03-20 DIAGNOSIS — E1165 Type 2 diabetes mellitus with hyperglycemia: Secondary | ICD-10-CM

## 2021-03-20 LAB — POCT URINALYSIS DIPSTICK
Bilirubin, UA: NEGATIVE
Blood, UA: NEGATIVE
Glucose, UA: NEGATIVE
Ketones, UA: NEGATIVE
Leukocytes, UA: NEGATIVE
Nitrite, UA: NEGATIVE
Protein, UA: NEGATIVE
Spec Grav, UA: 1.02 (ref 1.010–1.025)
Urobilinogen, UA: 0.2 E.U./dL
pH, UA: 7 (ref 5.0–8.0)

## 2021-03-20 NOTE — Progress Notes (Signed)
[employer] as a Education administrator for [employer], NP.,have documented all relevant documentation on the behalf of [employer], NP,as directed by  Bary Castilla, NP while in the presence of Bary Castilla, NP.  This visit occurred during the SARS-CoV-2 public health emergency.  Safety protocols were in place, including screening questions prior to the visit, additional usage of staff PPE, and extensive cleaning of exam room while observing appropriate contact time as indicated for disinfecting solutions.  Subjective:     Patient ID: Adam Wiley , male    DOB: Feb 16, 1976 , 45 y.o.   MRN: [id number]   Chief Complaint  Patient presents with   Annual Exam     HPI  PT here for HM. He complains of joint pain, he feels is caused by med he is currently taking fenofibrate. He would like to get off of medication.  No other concerns today.  Diet: he eats healthy; no fried foods.  Exercise: he exercises  Wt Readings from Last 3 Encounters: 03/20/21 : 183 lb (83 kg) 10/22/20 : 185 lb 12.8 oz (84.3 kg) 08/28/20 : 185 lb 3.2 oz (84 kg)      Past Medical History:  Diagnosis Date   Diabetes mellitus type 2 in nonobese ([place]) 03/07/2020   Diabetes mellitus without complication ([place])    Exertional dyspnea 03/07/2020   Hypertension    Pure hypercholesterolemia 03/07/2020   Tobacco abuse 03/07/2020     Family History  Problem Relation Age of Onset   Diabetes Mother    Diabetes Father    High blood pressure Other    Hypertension Brother    Hypertension Sister    Diabetes Sister      Current Outpatient Medications:    esomeprazole (NEXIUM) 40 MG capsule, TAKE 1 CAPSULE BY MOUTH DAILY, Disp: 90 capsule, Rfl: 1   fenofibrate 160 MG tablet, TAKE 1 TABLET (160 MG TOTAL) BY MOUTH DAILY., Disp: 90 tablet, Rfl: 1   fexofenadine (ALLEGRA) 180 MG tablet, Take 180 mg by mouth daily., Disp: , Rfl:    fluconazole (DIFLUCAN) 100 MG tablet, Take 1 tablet by mouth now, repeat in 5  days, Disp: 2 tablet, Rfl: 0   fluticasone (FLONASE) 50 MCG/ACT nasal spray, Place 2 sprays into both nostrils daily., Disp: 16 g, Rfl: 1   lisinopril-hydrochlorothiazide (ZESTORETIC) 20-12.5 MG tablet, Take 1 tablet by mouth daily., Disp: 90 tablet, Rfl: 1   methylPREDNISolone (MEDROL DOSEPAK) 4 MG TBPK tablet, TAKE AS DIRECTED ON PACKET FOR 6 DAYS (Patient taking differently: TAKE AS DIRECTED ON PACKET FOR 6 DAYS), Disp: 21 each, Rfl: 1   Saxagliptin-Metformin (KOMBIGLYZE XR) 11-998 MG TB24, Take 1 tablet by mouth daily., Disp: , Rfl:    Saxagliptin-Metformin (KOMBIGLYZE XR) 11-998 MG TB24, TAKE ONE TABLET BY MOUTH DAILY., Disp: 30 tablet, Rfl: 3   No Known Allergies   Men's preventive visit. Patient Health Questionnaire (PHQ-2) is  [place] Office Visit from 03/20/2021 in [facility]  PHQ-2 Total Score 0     . Patient is not on any diet. Marital status: Married. Relevant history for alcohol use is:  Social History   Substance and Sexual Activity  Alcohol Use Yes   Comment: occasionally  . Relevant history for tobacco use is:  Social History   Tobacco Use  Smoking Status Former   Packs/day: 0.25   Years: 5.00   Pack years: 1.25   Types: Cigarettes   Quit date: 2022   Years since quitting: 0.6  Smokeless Tobacco  Never  .   Review of Systems  Constitutional: Negative.  Negative for chills, fatigue and fever.  HENT: Negative.  Negative for congestion, rhinorrhea, sinus pressure, sinus pain and sneezing.   Respiratory:  Negative for cough, shortness of breath and wheezing.   Cardiovascular: Negative.  Negative for chest pain and palpitations.  Gastrointestinal:  Negative for abdominal pain, constipation and diarrhea.  Endocrine: Negative for polydipsia, polyphagia and polyuria.  Genitourinary:  Negative for flank pain.  Musculoskeletal: Negative.  Negative for arthralgias and myalgias.  Skin: Negative.   Allergic/Immunologic: Negative.    Neurological: Negative.  Negative for weakness and headaches.  Hematological: Negative.   Psychiatric/Behavioral: Negative.      Today's Vitals   03/20/21 1041  BP: 138/80  Pulse: 89  Temp: 98.1 F (36.7 C)  Weight: 183 lb (83 kg)  Height: $Remove'5\' 11"'EFvpPjk$  (1.803 m)   Body mass index is 25.52 kg/m.  Wt Readings from Last 3 Encounters:  03/20/21 183 lb (83 kg)  10/22/20 185 lb 12.8 oz (84.3 kg)  08/28/20 185 lb 3.2 oz (84 kg)    Objective:  Physical Exam Vitals and nursing note reviewed.  Constitutional:      Appearance: Normal appearance.  HENT:     Head: Normocephalic and atraumatic.     Right Ear: Tympanic membrane, ear canal and external ear normal.     Left Ear: Tympanic membrane, ear canal and external ear normal.     Nose: Nose normal.     Mouth/Throat:     Mouth: Mucous membranes are moist.     Pharynx: Oropharynx is clear.  Eyes:     Extraocular Movements: Extraocular movements intact.     Conjunctiva/sclera: Conjunctivae normal.     Pupils: Pupils are equal, round, and reactive to light.  Cardiovascular:     Rate and Rhythm: Normal rate and regular rhythm.     Pulses: Normal pulses.     Heart sounds: Normal heart sounds. No murmur heard. Pulmonary:     Effort: Pulmonary effort is normal. No respiratory distress.     Breath sounds: Normal breath sounds. No wheezing.  Chest:  Breasts:    Right: Normal. No swelling, bleeding, inverted nipple, mass or nipple discharge.     Left: Normal. No swelling, bleeding, inverted nipple, mass or nipple discharge.  Abdominal:     General: Abdomen is flat. Bowel sounds are normal. There is no distension.     Palpations: Abdomen is soft.     Tenderness: There is no abdominal tenderness.  Genitourinary:    Prostate: Normal.     Rectum: Normal. Guaiac result negative.  Musculoskeletal:        General: Normal range of motion.     Cervical back: Normal range of motion and neck supple.  Skin:    General: Skin is warm and dry.      Capillary Refill: Capillary refill takes less than 2 seconds.     Coloration: Skin is not jaundiced.  Neurological:     General: No focal deficit present.     Mental Status: He is alert and oriented to person, place, and time.  Psychiatric:        Mood and Affect: Mood normal.        Behavior: Behavior normal.        Assessment And Plan:    1. Encounter for annual physical exam --Patient is here for their annual physical exam and we discussed any changes to medication and medical history.  -Behavior modification  was discussed as well as diet and exercise history  -Patient will continue to exercise regularly and modify their diet.  -Recommendation for yearly physical annuals, immunization and screenings including mammogram and colonoscopy were discussed with the patient.  -Recommended intake of multivitamin, vitamin D and calcium.  -Individualized advise was given to the patient pertaining to their own health history in regards to diet, exercise, medical condition and referrals.  - CMP14+EGFR - CBC - PSA - POCT Urinalysis Dipstick (81002) - Microalbumin / creatinine urine ratio  2. Uncontrolled type 2 diabetes mellitus with hyperglycemia ([place]) --Discussed with patient the importance of glycemic control and long term complications from uncontrolled diabetes. Discussed with the patient the importance of compliance with home glucose monitoring, diet which includes decrease amount of sugary drinks and foods. Importance of exercise was also discussed with the patient. Importance of eye exams, self foot care and compliance to office visits was also discussed with the patient.  -Controlled, continue taking meds  - CMP14+EGFR - CBC - Hemoglobin A1c  3. Essential hypertension, benign -Limit the intake of processed foods and salt intake. You should increase your intake of green vegetables and fruits. Limit the use of alcohol. Limit fast foods and fried foods. Avoid high fatty saturated and  trans fat foods. Keep yourself hydrated with drinking water. Avoid red meats. Eat lean meats instead. Exercise for atleast 30-45 min for atleast 4-5 times a week.  -Chronic, controlled, continue meds.  - CMP14+EGFR - CBC - POCT Urinalysis Dipstick (81002) - Microalbumin / creatinine urine ratio  4. Pure hypercholesterolemia --Educated patient about a diet that is low in fat and high fatty foods including dairy products. Increase in take of fish and fiber. Decrease intake of red meats and fast foods. Exercise for atleast 4-5 times a week or atleast 30-45 min. Drink a lot of water.   - Lipid panel  Follow up: if symptoms persist or do not get better.   The patient was encouraged to call or send a message through [place] for any questions or concerns.   Staying healthy and adopting a healthy lifestyle for your overall health is important. You should eat 7 or more servings of fruits and vegetables per day. You should drink plenty of water to keep yourself hydrated and your kidneys healthy. This includes about 65-80+ fluid ounces of water. Limit your intake of animal fats especially for elevated cholesterol. Avoid highly processed food and limit your salt intake if you have hypertension. Avoid foods high in saturated/Trans fats. Along with a healthy diet it is also very important to maintain time for yourself to maintain a healthy mental health with low stress levels. You should get atleast 150 min of moderate intensity exercise weekly for a healthy heart. Along with eating right and exercising, aim for at least 7-9 hours of sleep daily.  Eat more whole grains which includes barley, wheat berries, oats, brown rice and whole wheat pasta. Use healthy plant oils which include olive, soy, corn, sunflower and peanut. Limit your caffeine and sugary drinks. Limit your intake of fast foods. Limit milk and dairy products to one or two daily servings.   Patient was given opportunity to ask questions. Patient  verbalized understanding of the plan and was able to repeat key elements of the plan. All questions were answered to their satisfaction.  Adam Kierrah Kilbride, DNP   I, Adam Wiley have reviewed all documentation for this visit. The documentation on 03/20/21 or the exam, diagnosis, procedures, and orders are all accurate  and complete.   THE PATIENT IS ENCOURAGED TO PRACTICE SOCIAL DISTANCING DUE TO THE COVID-19 PANDEMIC.

## 2021-03-20 NOTE — Patient Instructions (Signed)
Health Maintenance, Male Adopting a healthy lifestyle and getting preventive care are important in promoting health and wellness. Ask your health care provider about: The right schedule for you to have regular tests and exams. Things you can do on your own to prevent diseases and keep yourself healthy. What should I know about diet, weight, and exercise? Eat a healthy diet  Eat a diet that includes plenty of vegetables, fruits, low-fat dairy products, and lean protein. Do not eat a lot of foods that are high in solid fats, added sugars, or sodium. Maintain a healthy weight Body mass index (BMI) is a measurement that can be used to identify possible weight problems. It estimates body fat based on height and weight. Your health care provider can help determine your BMI and help you achieve or maintain a healthy weight. Get regular exercise Get regular exercise. This is one of the most important things you can do for your health. Most adults should: Exercise for at least 150 minutes each week. The exercise should increase your heart rate and make you sweat (moderate-intensity exercise). Do strengthening exercises at least twice a week. This is in addition to the moderate-intensity exercise. Spend less time sitting. Even light physical activity can be beneficial. Watch cholesterol and blood lipids Have your blood tested for lipids and cholesterol at 47 years of age, then have this test every 5 years. You may need to have your cholesterol levels checked more often if: Your lipid or cholesterol levels are high. You are older than 47 years of age. You are at high risk for heart disease. What should I know about cancer screening? Many types of cancers can be detected early and may often be prevented. Depending on your health history and family history, you may need to have cancer screening at various ages. This may include screening for: Colorectal cancer. Prostate cancer. Skin cancer. Lung  cancer. What should I know about heart disease, diabetes, and high blood pressure? Blood pressure and heart disease High blood pressure causes heart disease and increases the risk of stroke. This is more likely to develop in people who have high blood pressure readings, are of African descent, or are overweight. Talk with your health care provider about your target blood pressure readings. Have your blood pressure checked: Every 3-5 years if you are 18-39 years of age. Every year if you are 40 years old or older. If you are between the ages of 65 and 75 and are a current or former smoker, ask your health care provider if you should have a one-time screening for abdominal aortic aneurysm (AAA). Diabetes Have regular diabetes screenings. This checks your fasting blood sugar level. Have the screening done: Once every three years after age 45 if you are at a normal weight and have a low risk for diabetes. More often and at a younger age if you are overweight or have a high risk for diabetes. What should I know about preventing infection? Hepatitis B If you have a higher risk for hepatitis B, you should be screened for this virus. Talk with your health care provider to find out if you are at risk for hepatitis B infection. Hepatitis C Blood testing is recommended for: Everyone born from 1945 through 1965. Anyone with known risk factors for hepatitis C. Sexually transmitted infections (STIs) You should be screened each year for STIs, including gonorrhea and chlamydia, if: You are sexually active and are younger than 47 years of age. You are older than 47 years   of age and your health care provider tells you that you are at risk for this type of infection. Your sexual activity has changed since you were last screened, and you are at increased risk for chlamydia or gonorrhea. Ask your health care provider if you are at risk. Ask your health care provider about whether you are at high risk for HIV.  Your health care provider may recommend a prescription medicine to help prevent HIV infection. If you choose to take medicine to prevent HIV, you should first get tested for HIV. You should then be tested every 3 months for as long as you are taking the medicine. Follow these instructions at home: Lifestyle Do not use any products that contain nicotine or tobacco, such as cigarettes, e-cigarettes, and chewing tobacco. If you need help quitting, ask your health care provider. Do not use street drugs. Do not share needles. Ask your health care provider for help if you need support or information about quitting drugs. Alcohol use Do not drink alcohol if your health care provider tells you not to drink. If you drink alcohol: Limit how much you have to 0-2 drinks a day. Be aware of how much alcohol is in your drink. In the [place] one drink equals one 12 oz bottle of beer (355 mL), one 5 oz glass of wine (148 mL), or one 1 oz glass of hard liquor (44 mL). General instructions Schedule regular health, dental, and eye exams. Stay current with your vaccines. Tell your health care provider if: You often feel depressed. You have ever been abused or do not feel safe at home. Summary Adopting a healthy lifestyle and getting preventive care are important in promoting health and wellness. Follow your health care provider's instructions about healthy diet, exercising, and getting tested or screened for diseases. Follow your health care provider's instructions on monitoring your cholesterol and blood pressure. This information is not intended to replace advice given to you by your health care provider. Make sure you discuss any questions you have with your health care provider. Document Revised: 09/06/2020 Document Reviewed: 06/22/2018 Elsevier Patient Education  2022 Elsevier Inc.  

## 2021-03-21 LAB — CMP14+EGFR
ALT: 16 IU/L (ref 0–44)
AST: 19 IU/L (ref 0–40)
Albumin/Globulin Ratio: 2.1 (ref 1.2–2.2)
Albumin: 4.8 g/dL (ref 4.0–5.0)
Alkaline Phosphatase: 57 IU/L (ref 44–121)
BUN/Creatinine Ratio: 9 (ref 9–20)
BUN: 13 mg/dL (ref 6–24)
Bilirubin Total: 0.3 mg/dL (ref 0.0–1.2)
CO2: 23 mmol/L (ref 20–29)
Calcium: 10 mg/dL (ref 8.7–10.2)
Chloride: 103 mmol/L (ref 96–106)
Creatinine, Ser: 1.43 mg/dL — ABNORMAL HIGH (ref 0.76–1.27)
Globulin, Total: 2.3 g/dL (ref 1.5–4.5)
Glucose: 105 mg/dL — ABNORMAL HIGH (ref 65–99)
Potassium: 4.3 mmol/L (ref 3.5–5.2)
Sodium: 140 mmol/L (ref 134–144)
Total Protein: 7.1 g/dL (ref 6.0–8.5)
eGFR: 61 mL/min/{1.73_m2} (ref >59)

## 2021-03-21 LAB — LIPID PANEL
Chol/HDL Ratio: 4 ratio (ref 0.0–5.0)
Cholesterol, Total: 198 mg/dL (ref 100–199)
HDL: 50 mg/dL (ref >39)
LDL Chol Calc (NIH): 124 mg/dL — ABNORMAL HIGH (ref 0–99)
Triglycerides: 132 mg/dL (ref 0–149)
VLDL Cholesterol Cal: 24 mg/dL (ref 5–40)

## 2021-03-21 LAB — CBC
Hematocrit: 39.9 % (ref 37.5–51.0)
Hemoglobin: 13.6 g/dL (ref 13.0–17.7)
MCH: 29.8 pg (ref 26.6–33.0)
MCHC: 34.1 g/dL (ref 31.5–35.7)
MCV: 87 fL (ref 79–97)
Platelets: 283 10*3/uL (ref 150–450)
RBC: 4.57 x10E6/uL (ref 4.14–5.80)
RDW: 13.2 % (ref 11.6–15.4)
WBC: 8.3 10*3/uL (ref 3.4–10.8)

## 2021-03-21 LAB — MICROALBUMIN / CREATININE URINE RATIO
Creatinine, Urine: 91.4 mg/dL
Microalb/Creat Ratio: 5 mg/g creat (ref 0–29)
Microalbumin, Urine: 4.4 ug/mL

## 2021-03-21 LAB — PSA: Prostate Specific Ag, Serum: 1.2 ng/mL (ref 0.0–4.0)

## 2021-03-21 LAB — HEMOGLOBIN A1C
Est. average glucose Bld gHb Est-mCnc: 146 mg/dL
Hgb A1c MFr Bld: 6.7 % — ABNORMAL HIGH (ref 4.8–5.6)

## 2021-04-08 ENCOUNTER — Other Ambulatory Visit (HOSPITAL_COMMUNITY): Payer: Self-pay

## 2021-04-30 ENCOUNTER — Other Ambulatory Visit (HOSPITAL_COMMUNITY): Payer: Self-pay

## 2021-05-08 ENCOUNTER — Other Ambulatory Visit: Payer: Self-pay | Admitting: Internal Medicine

## 2021-05-09 ENCOUNTER — Other Ambulatory Visit (HOSPITAL_COMMUNITY): Payer: Self-pay

## 2021-05-09 MED ORDER — KOMBIGLYZE XR 5-1000 MG PO TB24
1.0000 | ORAL_TABLET | Freq: Every day | ORAL | 3 refills | Status: DC
  Filled 2021-05-09: qty 30, 30d supply, fill #0
  Filled 2021-06-16: qty 30, 30d supply, fill #1
  Filled 2021-07-27: qty 30, 30d supply, fill #2
  Filled 2021-08-27: qty 30, 30d supply, fill #3

## 2021-05-29 ENCOUNTER — Other Ambulatory Visit (HOSPITAL_COMMUNITY): Payer: Self-pay

## 2021-05-29 ENCOUNTER — Other Ambulatory Visit: Payer: Self-pay | Admitting: Internal Medicine

## 2021-05-29 MED ORDER — LISINOPRIL-HYDROCHLOROTHIAZIDE 20-12.5 MG PO TABS
1.0000 | ORAL_TABLET | Freq: Every day | ORAL | 1 refills | Status: DC
  Filled 2021-05-29: qty 90, 90d supply, fill #0
  Filled 2021-08-29: qty 90, 90d supply, fill #1

## 2021-06-16 ENCOUNTER — Other Ambulatory Visit (HOSPITAL_COMMUNITY): Payer: Self-pay

## 2021-06-17 ENCOUNTER — Other Ambulatory Visit (HOSPITAL_COMMUNITY): Payer: Self-pay

## 2021-06-17 ENCOUNTER — Telehealth (INDEPENDENT_AMBULATORY_CARE_PROVIDER_SITE_OTHER): Payer: 59 | Admitting: Nurse Practitioner

## 2021-06-17 ENCOUNTER — Encounter: Payer: Self-pay | Admitting: Nurse Practitioner

## 2021-06-17 ENCOUNTER — Encounter: Payer: Self-pay | Admitting: Internal Medicine

## 2021-06-17 DIAGNOSIS — J069 Acute upper respiratory infection, unspecified: Secondary | ICD-10-CM | POA: Diagnosis not present

## 2021-06-17 DIAGNOSIS — R051 Acute cough: Secondary | ICD-10-CM | POA: Diagnosis not present

## 2021-06-17 MED ORDER — AZITHROMYCIN 250 MG PO TABS
ORAL_TABLET | ORAL | 0 refills | Status: AC
Start: 2021-06-17 — End: 2021-06-22
  Filled 2021-06-17: qty 6, 5d supply, fill #0

## 2021-06-17 MED ORDER — HYDROCODONE BIT-HOMATROP MBR 5-1.5 MG/5ML PO SOLN
5.0000 mL | Freq: Four times a day (QID) | ORAL | 0 refills | Status: DC | PRN
  Filled 2021-06-17: qty 120, 6d supply, fill #0

## 2021-06-17 NOTE — Progress Notes (Deleted)
This visit occurred during the SARS-CoV-2 public health emergency.  Safety protocols were in place, including screening questions prior to the visit, additional usage of staff PPE, and extensive cleaning of exam room while observing appropriate contact time as indicated for disinfecting solutions.  Subjective:     Patient ID: Adam Wiley , male    DOB: [birth year] , 45 y.o.   MRN: [id number]   Chief Complaint  Patient presents with   URI     HPI  Pt presents today for cold chills, body aches, stuffy nose ,fatigue, mild cough that causes a sudden pain. He had taken a covid test yesterday , result came back negative. He takes allegra for sinuses & cough medicine.   No other family members are sick.  He is in [place] he is unable to come to the office to be checked for the flu.  Blood sugar is 125.   A17: 125 taken today.     Past Medical History:  Diagnosis Date   Diabetes mellitus type 2 in nonobese ([place]) 03/07/2020   Diabetes mellitus without complication ([place])    Exertional dyspnea 03/07/2020   Hypertension    Pure hypercholesterolemia 03/07/2020   Tobacco abuse 03/07/2020     Family History  Problem Relation Age of Onset   Diabetes Mother    Diabetes Father    High blood pressure Other    Hypertension Brother    Hypertension Sister    Diabetes Sister      Current Outpatient Medications:    azithromycin (ZITHROMAX) 250 MG tablet, Take 2 tablets (500 mg) on  Day 1,  followed by 1 tablet (250 mg) once daily on Days 2 through 5., Disp: 6 each, Rfl: 0   esomeprazole (NEXIUM) 40 MG capsule, TAKE 1 CAPSULE BY MOUTH DAILY, Disp: 90 capsule, Rfl: 1   fenofibrate 160 MG tablet, TAKE 1 TABLET (160 MG TOTAL) BY MOUTH DAILY., Disp: 90 tablet, Rfl: 1   fexofenadine (ALLEGRA) 180 MG tablet, Take 180 mg by mouth daily., Disp: , Rfl:    fluticasone (FLONASE) 50 MCG/ACT nasal spray, Place 2 sprays into both nostrils daily., Disp: 16 g, Rfl: 1   HYDROcodone bit-homatropine (HYDROMET) 5-1.5  MG/5ML syrup, Take 5 mLs by mouth every 6 (six) hours as needed for cough., Disp: 120 mL, Rfl: 0   lisinopril-hydrochlorothiazide (ZESTORETIC) 20-12.5 MG tablet, Take 1 tablet by mouth daily., Disp: 90 tablet, Rfl: 1   Saxagliptin-Metformin (KOMBIGLYZE XR) 11-998 MG TB24, Take 1 tablet by mouth daily., Disp: , Rfl:    Saxagliptin-Metformin (KOMBIGLYZE XR) 11-998 MG TB24, TAKE ONE TABLET BY MOUTH DAILY., Disp: 30 tablet, Rfl: 3   fluconazole (DIFLUCAN) 100 MG tablet, Take 1 tablet by mouth now, repeat in 5 days (Patient not taking: Reported on 06/17/2021), Disp: 2 tablet, Rfl: 0   No Known Allergies   Review of Systems  Constitutional:  Positive for chills. Negative for fatigue and fever.  Respiratory:  Positive for cough and shortness of breath. Negative for wheezing.   Cardiovascular: Negative.  Negative for chest pain and leg swelling.  Musculoskeletal:  Positive for arthralgias and myalgias.  Neurological: Negative.   Psychiatric/Behavioral: Negative.      Today's Vitals   There is no height or weight on file to calculate BMI.   Objective:  Physical Exam Vitals reviewed.  Constitutional:      General: He is not in acute distress.    Appearance: Normal appearance.     Comments: Appears to not feel well  Cardiovascular:     Heart sounds: No murmur heard. Pulmonary:     Effort: No respiratory distress.     Breath sounds: No wheezing.  Neurological:     General: No focal deficit present.     Mental Status: He is alert and oriented to person, place, and time.  Psychiatric:        Mood and Affect: Mood normal.        Behavior: Behavior normal.        Thought Content: Thought content normal.        Judgment: Judgment normal.        Assessment And Plan:     1. Upper respiratory tract infection, unspecified type Comments: Will treat with azithromycin, explained this could be viral.   2. Acute cough Comments: Advised to not take cough syrup when driving or operating heavy  machinery - azithromycin (ZITHROMAX) 250 MG tablet; Take 2 tablets (500 mg) on  Day 1,  followed by 1 tablet (250 mg) once daily on Days 2 through 5.  Dispense: 6 each; Refill: 0 - HYDROcodone bit-homatropine (HYDROMET) 5-1.5 MG/5ML syrup; Take 5 mLs by mouth every 6 (six) hours as needed for cough.  Dispense: 120 mL; Refill: 0    Patient was given opportunity to ask questions. Patient verbalized understanding of the plan and was able to repeat key elements of the plan. All questions were answered to their satisfaction.   [name], FNP   I, [name], FNP, have reviewed all documentation for this visit. The documentation on 06/17/21 for the exam, diagnosis, procedures, and orders are all accurate and complete.   IF YOU HAVE BEEN REFERRED TO A SPECIALIST, IT MAY TAKE 1-2 WEEKS TO SCHEDULE/PROCESS THE REFERRAL. IF YOU HAVE NOT HEARD FROM US/SPECIALIST IN TWO WEEKS, PLEASE GIVE [place] A CALL AT [phone].   THE PATIENT IS ENCOURAGED TO PRACTICE SOCIAL DISTANCING DUE TO THE COVID-19 PANDEMIC.

## 2021-06-17 NOTE — Progress Notes (Signed)
Virtual Visit via MyChart   This visit type was conducted due to national recommendations for restrictions regarding the COVID-19 Pandemic (e.g. social distancing) in an effort to limit this patient's exposure and mitigate transmission in our community.  Due to his co-morbid illnesses, this patient is at least at moderate risk for complications without adequate follow up.  This format is felt to be most appropriate for this patient at this time.  All issues noted in this document were discussed and addressed.  A limited physical exam was performed with this format.    This visit type was conducted due to national recommendations for restrictions regarding the COVID-19 Pandemic (e.g. social distancing) in an effort to limit this patient's exposure and mitigate transmission in our community.  Patients identity confirmed using two different identifiers.  This format is felt to be most appropriate for this patient at this time.  All issues noted in this document were discussed and addressed.  No physical exam was performed (except for noted visual exam findings with Video Visits).    Date:  06/17/2021   ID:  Adam Wiley, DOB 1975-09-23, MRN [id number]  Patient Location:  Car - I asked him to pull over to do visit  Provider location:   Office    Chief Complaint:  cold symptoms  History of Present Illness:    Adam Wiley is a 45 y.o. male who presents via video conferencing for a telehealth visit today.    The patient does have symptoms concerning for COVID-19 infection (fever, chills, cough, or new shortness of breath).   HPI  Pt presents today for cold chills, body aches, stuffy nose ,fatigue, mild cough that causes a sudden pain. He had taken a covid test yesterday , result came back negative. He takes allegra for sinuses & cough medicine.    No other family members are sick.  He is in [place] he is unable to come to the office to be checked for the flu.  Blood sugar is 125.        Past Medical History:  Diagnosis Date   Diabetes mellitus type 2 in nonobese ([place]) 03/07/2020   Diabetes mellitus without complication ([place])    Exertional dyspnea 03/07/2020   Hypertension    Pure hypercholesterolemia 03/07/2020   Tobacco abuse 03/07/2020   History reviewed. No pertinent surgical history.   Current Meds  Medication Sig   azithromycin (ZITHROMAX) 250 MG tablet Take 2 tablets (500 mg) on  Day 1,  followed by 1 tablet (250 mg) once daily on Days 2 through 5.   esomeprazole (NEXIUM) 40 MG capsule TAKE 1 CAPSULE BY MOUTH DAILY   fenofibrate 160 MG tablet TAKE 1 TABLET (160 MG TOTAL) BY MOUTH DAILY.   fexofenadine (ALLEGRA) 180 MG tablet Take 180 mg by mouth daily.   fluticasone (FLONASE) 50 MCG/ACT nasal spray Place 2 sprays into both nostrils daily.   HYDROcodone bit-homatropine (HYDROMET) 5-1.5 MG/5ML syrup Take 5 mLs by mouth every 6 (six) hours as needed for cough.   lisinopril-hydrochlorothiazide (ZESTORETIC) 20-12.5 MG tablet Take 1 tablet by mouth daily.   Saxagliptin-Metformin (KOMBIGLYZE XR) 11-998 MG TB24 Take 1 tablet by mouth daily.   Saxagliptin-Metformin (KOMBIGLYZE XR) 11-998 MG TB24 TAKE ONE TABLET BY MOUTH DAILY.     Allergies:   Patient has no known allergies.   Social History   Tobacco Use   Smoking status: Former    Packs/day: 0.25    Years: 5.00    Pack years: 1.25  Types: Cigarettes    Quit date: 2022    Years since quitting: 0.9   Smokeless tobacco: Never  Vaping Use   Vaping Use: Never used  Substance Use Topics   Alcohol use: Yes    Comment: occasionally   Drug use: No     Family Hx: The patient's family history includes Diabetes in his father, mother, and sister; High blood pressure in an other family member; Hypertension in his brother and sister.  ROS:   Please see the history of present illness.    Review of Systems  Constitutional:  Positive for chills.  HENT: Negative.    Respiratory:  Positive for cough, shortness of  breath and wheezing.   Cardiovascular: Negative.   Musculoskeletal:  Positive for myalgias.       Arthralgias  Neurological: Negative.   Psychiatric/Behavioral: Negative.     All other systems reviewed and are negative.    Labs/Other Tests and Data Reviewed:    Recent Labs: 03/20/2021: ALT 16; BUN 13; Creatinine, Ser 1.43; Hemoglobin 13.6; Platelets 283; Potassium 4.3; Sodium 140   Recent Lipid Panel Lab Results  Component Value Date/Time   CHOL 198 03/20/2021 11:22 AM   TRIG 132 03/20/2021 11:22 AM   HDL 50 03/20/2021 11:22 AM   CHOLHDL 4.0 03/20/2021 11:22 AM   LDLCALC 124 (H) 03/20/2021 11:22 AM    Wt Readings from Last 3 Encounters:  03/20/21 183 lb (83 kg)  10/22/20 185 lb 12.8 oz (84.3 kg)  08/28/20 185 lb 3.2 oz (84 kg)     Exam:    Vital Signs:  There were no vitals taken for this visit.    Physical Exam Vitals reviewed.  Constitutional:      General: He is not in acute distress.    Appearance: Normal appearance. He is obese.     Comments: Appears to not feel well  Pulmonary:     Effort: Pulmonary effort is normal. No respiratory distress.  Neurological:     General: No focal deficit present.     Mental Status: He is alert and oriented to person, place, and time.  Psychiatric:        Mood and Affect: Mood normal.        Behavior: Behavior normal.        Thought Content: Thought content normal.        Judgment: Judgment normal.    ASSESSMENT & PLAN:     1. Upper respiratory tract infection, unspecified type Will treat with azithromycin, explained this could be viral.  2. Acute cough Advised to not take cough syrup when driving or operating heavy machinery - azithromycin (ZITHROMAX) 250 MG tablet; Take 2 tablets (500 mg) on  Day 1,  followed by 1 tablet (250 mg) once daily on Days 2 through 5.  Dispense: 6 each; Refill: 0 - HYDROcodone bit-homatropine (HYDROMET) 5-1.5 MG/5ML syrup; Take 5 mLs by mouth every 6 (six) hours as needed for cough.   Dispense: 120 mL; Refill: 0    COVID-19 Education: The signs and symptoms of COVID-19 were discussed with the patient and how to seek care for testing (follow up with PCP or arrange E-visit).  The importance of social distancing was discussed today.  Patient Risk:   After full review of this patients clinical status, I feel that they are at least moderate risk at this time.  Time:   Today, I have spent 8.50 minutes/ seconds with the patient with telehealth technology discussing above diagnoses.  Medication Adjustments/Labs and Tests Ordered: Current medicines are reviewed at length with the patient today.  Concerns regarding medicines are outlined above.   Tests Ordered: No orders of the defined types were placed in this encounter.   Medication Changes: Meds ordered this encounter  Medications   azithromycin (ZITHROMAX) 250 MG tablet    Sig: Take 2 tablets (500 mg) on  Day 1,  followed by 1 tablet (250 mg) once daily on Days 2 through 5.    Dispense:  6 each    Refill:  0   HYDROcodone bit-homatropine (HYDROMET) 5-1.5 MG/5ML syrup    Sig: Take 5 mLs by mouth every 6 (six) hours as needed for cough.    Dispense:  120 mL    Refill:  0    Disposition:  Follow up prn  Signed, [name], FNP

## 2021-06-18 ENCOUNTER — Encounter: Payer: Self-pay | Admitting: Nurse Practitioner

## 2021-06-28 ENCOUNTER — Other Ambulatory Visit (HOSPITAL_COMMUNITY): Payer: Self-pay

## 2021-06-28 DIAGNOSIS — R051 Acute cough: Secondary | ICD-10-CM | POA: Diagnosis not present

## 2021-06-28 DIAGNOSIS — I10 Essential (primary) hypertension: Secondary | ICD-10-CM | POA: Diagnosis not present

## 2021-06-28 DIAGNOSIS — J019 Acute sinusitis, unspecified: Secondary | ICD-10-CM | POA: Diagnosis not present

## 2021-06-28 DIAGNOSIS — J209 Acute bronchitis, unspecified: Secondary | ICD-10-CM | POA: Diagnosis not present

## 2021-06-28 DIAGNOSIS — E109 Type 1 diabetes mellitus without complications: Secondary | ICD-10-CM | POA: Diagnosis not present

## 2021-06-28 MED ORDER — AMOXICILLIN-POT CLAVULANATE 875-125 MG PO TABS
ORAL_TABLET | ORAL | 0 refills | Status: DC
  Filled 2021-06-28: qty 20, 10d supply, fill #0

## 2021-06-28 MED ORDER — AZITHROMYCIN 250 MG PO TABS
ORAL_TABLET | ORAL | 0 refills | Status: DC
  Filled 2021-06-28: qty 6, 5d supply, fill #0

## 2021-06-28 MED ORDER — CHLORPHEN-PE-ACETAMINOPHEN 4-10-325 MG PO TABS
ORAL_TABLET | ORAL | 0 refills | Status: DC
  Filled 2021-06-28: qty 20, 4d supply, fill #0

## 2021-06-28 MED ORDER — PREDNISONE 10 MG PO TABS
ORAL_TABLET | ORAL | 0 refills | Status: DC
  Filled 2021-06-28: qty 48, 12d supply, fill #0

## 2021-06-28 MED ORDER — ALBUTEROL SULFATE HFA 108 (90 BASE) MCG/ACT IN AERS
INHALATION_SPRAY | RESPIRATORY_TRACT | 0 refills | Status: DC
  Filled 2021-06-28: qty 18, 15d supply, fill #0

## 2021-07-01 ENCOUNTER — Other Ambulatory Visit (HOSPITAL_COMMUNITY): Payer: Self-pay

## 2021-07-09 IMAGING — CT CT HEART MORP W/ CTA COR W/ SCORE W/ CA W/CM &/OR W/O CM
4 of 7 series · 8 of 20 positions shown, 9 images · IV contrast (APPLIED)
Comparison: None.
COMPARISON: None.

Addendum:
EXAM:
OVER-READ INTERPRETATION  CT CHEST

The following report is an over-read performed by radiologist Dr.
Marioco Bhor [REDACTED] on 09/05/2020. This
over-read does not include interpretation of cardiac or coronary
anatomy or pathology. The coronary calcium score/coronary CTA
interpretation by the cardiologist is attached.
CLINICAL DATA: 45M with CM, hypertension and chest pain.
Cardiac/Coronary  CT
TECHNIQUE: The patient was scanned on a [employer] Force scanner.

[Series 6: best diast 77 % · axial · 0.37mm/px · z∈[-137,-93]mm · 2 of 328 slices shown, 3 images]
[im 110/328  vessel]
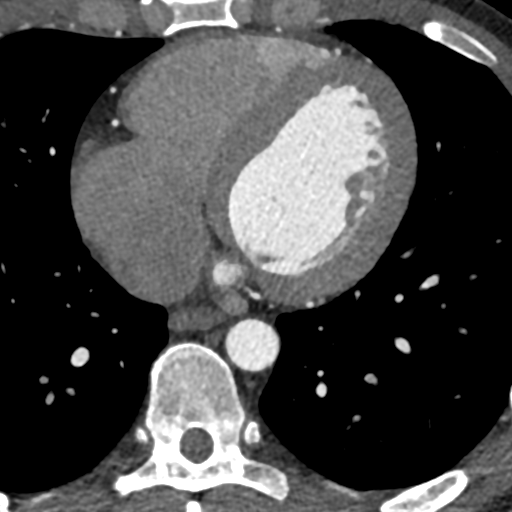
[im 110/328  lung]
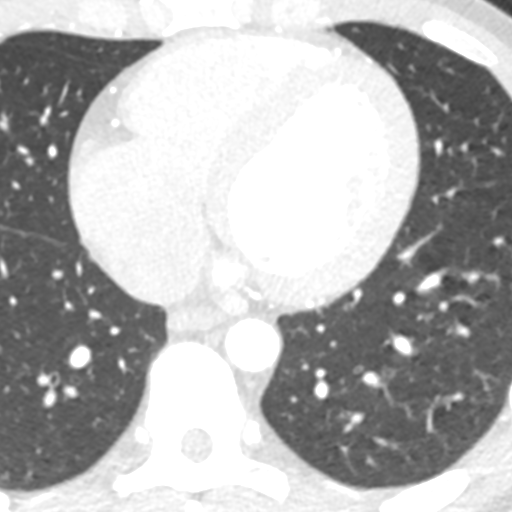
[im 219/328  vessel]
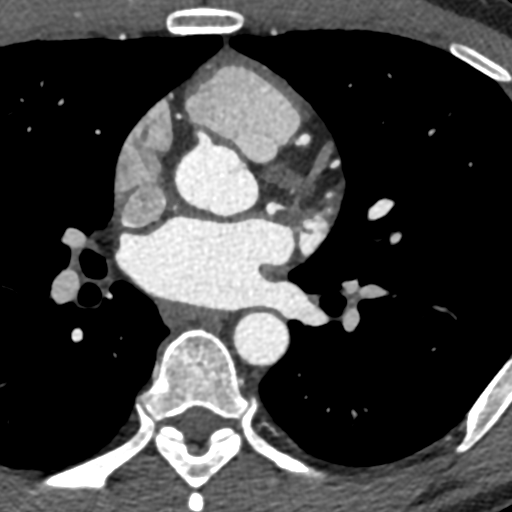

[Series 7: best syst 32 % · axial · 0.37mm/px · z∈[-137,-93]mm · 2 of 328 slices shown]
[im 110/328  vessel]
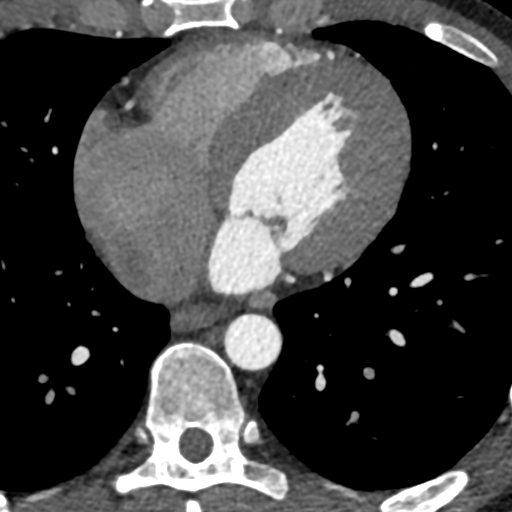
[im 219/328  vessel]
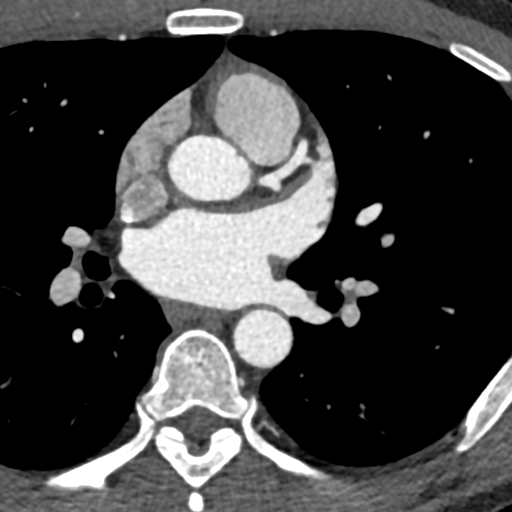

[Series 8: ts diast sharp 77 % · axial · 0.37mm/px · z∈[-137,-93]mm · 2 of 328 slices shown]
[im 110/328  lung]
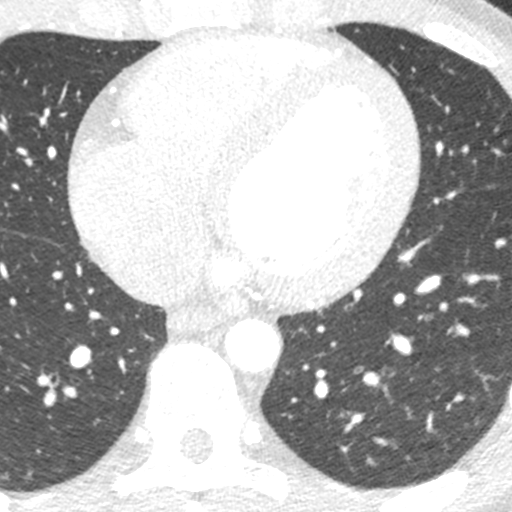
[im 219/328  lung]
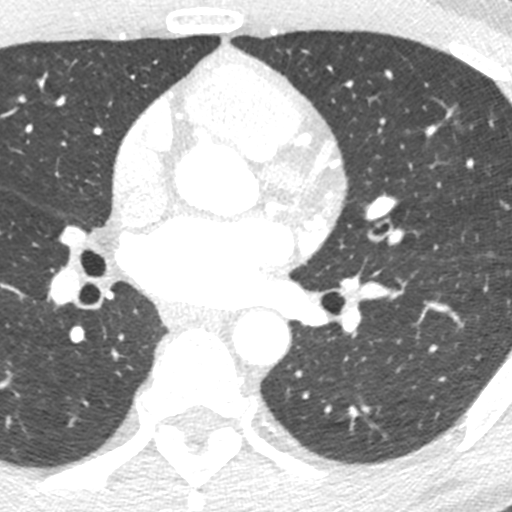

[Series 9: ts syst sharp 32 % · axial · 0.37mm/px · z∈[-137,-93]mm · 2 of 328 slices shown]
[im 110/328  lung]
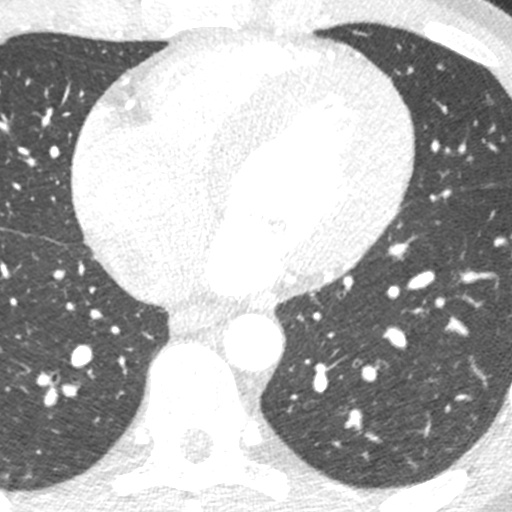
[im 219/328  lung]
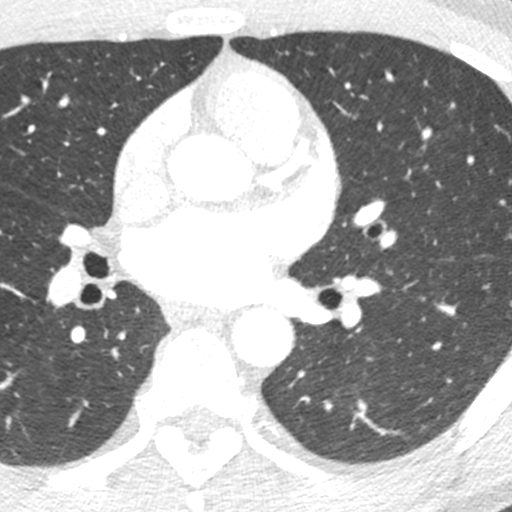

[8 of 20 positions shown; findings below may reference images not displayed]

FINDINGS: Within the visualized portions of the thorax there are no suspicious
appearing pulmonary nodules or masses, there is no acute
consolidative airspace disease, no pleural effusions, no
pneumothorax and no lymphadenopathy. Visualized portions of the
upper abdomen are unremarkable. There are no aggressive appearing
lytic or blastic lesions noted in the visualized portions of the
skeleton.
IMPRESSION: 1. No significant incidental noncardiac findings are noted.
FINDINGS: A 120 kV prospective scan was triggered in the descending thoracic
aorta at 111 HU's. Axial non-contrast 3 mm slices were carried out
through the heart. The data set was analyzed on a dedicated work
station and scored using the Agatson method. Gantry rotation speed
was 250 msecs and collimation was .6 mm. No beta blockade and 0.8 mg
of sl NTG was given. The 3D data set was reconstructed in 5%
intervals of the 67-82 % of the R-R cycle. Diastolic phases were
analyzed on a dedicated work station using MPR, MIP and VRT modes.
The patient received 80 cc of contrast.

Aorta: Normal size. Ascending aorta 2.4 cm. No calcifications. No
dissection.

Aortic Valve:  Trileaflet.  No calcifications.

Coronary Arteries:  Normal coronary origin.  Co-dominance.

RCA is a large dominant artery that gives rise to R-PDA. There is no
plaque.

Left main is a large artery that gives rise to LAD and LCX arteries.

LAD is a large vessel that has no plaque. There are two diagonal
vessels without plaque.

LCX is a co-dominant artery that gives rise to a small OM1, large
OM2, tiny OM3, OM4 and L-PDA. There is no plaque.

Other findings:

Normal pulmonary vein drainage into the left atrium. There are three
pulmonary veins on the right and two on the left.

Normal let atrial appendage without a thrombus.

Normal size of the pulmonary artery.
IMPRESSION: 1. Coronary calcium score of 0. This was 0 percentile for age and
sex matched control.

2. Normal coronary origin with co-dominance.

3. No evidence of CAD.

4.  Consider non-cardiac causes of chest pain.

*** End of Addendum ***
EXAM:
OVER-READ INTERPRETATION  CT CHEST

The following report is an over-read performed by radiologist Dr.
Marioco Bhor [REDACTED] on 09/05/2020. This
over-read does not include interpretation of cardiac or coronary
anatomy or pathology. The coronary calcium score/coronary CTA
interpretation by the cardiologist is attached.
FINDINGS: Within the visualized portions of the thorax there are no suspicious
appearing pulmonary nodules or masses, there is no acute
consolidative airspace disease, no pleural effusions, no
pneumothorax and no lymphadenopathy. Visualized portions of the
upper abdomen are unremarkable. There are no aggressive appearing
lytic or blastic lesions noted in the visualized portions of the
skeleton.
IMPRESSION: 1. No significant incidental noncardiac findings are noted.

## 2021-07-10 ENCOUNTER — Other Ambulatory Visit (HOSPITAL_COMMUNITY): Payer: Self-pay

## 2021-07-23 ENCOUNTER — Other Ambulatory Visit: Payer: Self-pay

## 2021-07-23 ENCOUNTER — Encounter: Payer: Self-pay | Admitting: Internal Medicine

## 2021-07-23 ENCOUNTER — Ambulatory Visit (INDEPENDENT_AMBULATORY_CARE_PROVIDER_SITE_OTHER): Payer: No Typology Code available for payment source | Admitting: Internal Medicine

## 2021-07-23 VITALS — BP 132/78 | HR 74 | Temp 98.1°F | Ht 71.0 in | Wt 182.0 lb

## 2021-07-23 DIAGNOSIS — E7800 Pure hypercholesterolemia, unspecified: Secondary | ICD-10-CM

## 2021-07-23 DIAGNOSIS — I10 Essential (primary) hypertension: Secondary | ICD-10-CM | POA: Diagnosis not present

## 2021-07-23 DIAGNOSIS — E1159 Type 2 diabetes mellitus with other circulatory complications: Secondary | ICD-10-CM

## 2021-07-23 DIAGNOSIS — E1165 Type 2 diabetes mellitus with hyperglycemia: Secondary | ICD-10-CM | POA: Diagnosis not present

## 2021-07-23 DIAGNOSIS — Z6825 Body mass index (BMI) 25.0-25.9, adult: Secondary | ICD-10-CM

## 2021-07-23 NOTE — Patient Instructions (Signed)
Cooking With Less Salt Cooking with less salt is one way to reduce the amount of sodium you get from food. Sodium is one of the elements that make up salt. It is found naturally in foods and is also added to certain foods. Depending on your condition and overall health, your health care provider or dietitian may recommend that you reduce your sodium intake. Most people should have less than 2,300 milligrams (mg) of sodium each day. If you have high blood pressure (hypertension), you may need to limit your sodium to 1,500 mg each day. Follow the tipsbelow to help reduce your sodium intake. What are tips for eating less sodium? Reading food labels  Check the food label before buying or using packaged ingredients. Always check the label for the serving size and sodium content. Look for products with no more than 140 mg of sodium in one serving. Check the % Daily Value column to see what percent of the daily recommended amount of sodium is provided in one serving of the product. Foods with 5% or less in this column are considered low in sodium. Foods with 20% or higher are considered high in sodium. Do not choose foods with salt as one of the first three ingredients on the ingredients list. If salt is one of the first three ingredients, it usually means the item is high in sodium.  Shopping Buy sodium-free or low-sodium products. Look for the following words on food labels: Low-sodium. Sodium-free. Reduced-sodium. No salt added. Unsalted. Always check the sodium content even if foods are labeled as low-sodium or no salt added. Buy fresh foods. Cooking Use herbs, seasonings without salt, and spices as substitutes for salt. Use sodium-free baking soda when baking. Grill, braise, or roast foods to add flavor with less salt. Avoid adding salt to pasta, rice, or hot cereals. Drain and rinse canned vegetables, beans, and meat before use. Avoid adding salt when cooking sweets and desserts. Cook with  low-sodium ingredients. What foods are high in sodium? Vegetables Regular canned vegetables (not low-sodium or reduced-sodium). Sauerkraut, pickled vegetables, and relishes. Olives. French fries. Onion rings. Regular canned tomato sauce and paste. Regular tomato and vegetable juice. Frozenvegetables in sauces. Grains Instant hot cereals. Bread stuffing, pancake, and biscuit mixes. Croutons. Seasoned rice or pasta mixes. Noodle soup cups. Boxed or frozen macaroni and cheese. Regular salted crackers. Self-rising flour. Rolls. Bagels. Flourtortillas and wraps. Meats and other proteins Meat or fish that is salted, canned, smoked, cured, spiced, or pickled. This includes bacon, ham, sausages, hot dogs, corned beef, chipped beef, meat loaves, salt pork, jerky, pickled herring, anchovies, regular canned tuna, andsardines. Salted nuts. Dairy Processed cheese and cheese spreads. Cheese curds. Blue cheese. Feta cheese.String cheese. Regular cottage cheese. Buttermilk. Canned milk. The items listed above may not be a complete list of foods high in sodium. Actual amounts of sodium may be different depending on processing. Contact a dietitian for more information. What foods are low in sodium? Fruits Fresh, frozen, or canned fruit with no sauce added. Fruit juice. Vegetables Fresh or frozen vegetables with no sauce added. "No salt added" canned vegetables. "No salt added" tomato sauce and paste. Low-sodium orreduced-sodium tomato and vegetable juice. Grains Noodles, pasta, quinoa, rice. Shredded or puffed wheat or puffed rice. Regular or quick oats (not instant). Low-sodium crackers. Low-sodium bread. Whole-grainbread and whole-grain pasta. Unsalted popcorn. Meats and other proteins Fresh or frozen whole meats, poultry (not injected with sodium), and fish with no sauce added. Unsalted nuts. Dried peas, beans, and   lentils without added salt. Unsalted canned beans. Eggs. Unsalted nut butters. Low-sodium canned  tunaor chicken. Dairy Milk. Soy milk. Yogurt. Low-sodium cheeses, such as Swiss, Monterey Jack, mozzarella, and ricotta. Sherbet or ice cream (keep to  cup per serving).Cream cheese. Fats and oils Unsalted butter or margarine. Other foods Homemade pudding. Sodium-free baking soda and baking powder. Herbs and spices.Low-sodium seasoning mixes. Beverages Coffee and tea. Carbonated beverages. The items listed above may not be a complete list of foods low in sodium. Actual amounts of sodium may be different depending on processing. Contact a dietitian for more information. What are some salt alternatives when cooking? The following are herbs, seasonings, and spices that can be used instead of salt to flavor your food. Herbs should be fresh or dried. Do not choose packaged mixes. Next to the name of the herb, spice, or seasoning aresome examples of foods you can pair it with. Herbs [place] leaves - Soups, meat and vegetable dishes, and spaghetti sauce. Basil - Italian dishes, soups, pasta, and fish dishes. Cilantro - Meat, poultry, and vegetable dishes. Chili powder - Marinades and Mexican dishes. Chives - Salad dressings and potato dishes. Cumin - Mexican dishes, couscous, and meat dishes. Dill - Fish dishes, sauces, and salads. Fennel - Meat and vegetable dishes, breads, and cookies. Garlic (do not use garlic salt) - Italian dishes, meat dishes, salad dressings, and sauces. Marjoram - Soups, potato dishes, and meat dishes. Oregano - Pizza and spaghetti sauce. Parsley - Salads, soups, pasta, and meat dishes. Rosemary - Italian dishes, salad dressings, soups, and red meats. Saffron - Fish dishes, pasta, and some poultry dishes. Sage - Stuffings and sauces. Tarragon - Fish and poultry dishes. Thyme - Stuffing, meat, and fish dishes. Seasonings Lemon juice - Fish dishes, poultry dishes, vegetables, and salads. Vinegar - Salad dressings, vegetables, and fish dishes. Spices Cinnamon - Sweet  dishes, such as cakes, cookies, and puddings. Cloves - Gingerbread, puddings, and marinades for meats. Curry - Vegetable dishes, fish and poultry dishes, and stir-fry dishes. Ginger - Vegetable dishes, fish dishes, and stir-fry dishes. Nutmeg - Pasta, vegetables, poultry, fish dishes, and custard. Summary Cooking with less salt is one way to reduce the amount of sodium that you get from food. Buy sodium-free or low-sodium products. Check the food label before using or buying packaged ingredients. Use herbs, seasonings without salt, and spices as substitutes for salt in foods. This information is not intended to replace advice given to you by your health care provider. Make sure you discuss any questions you have with your healthcare provider. Document Revised: 06/21/2019 Document Reviewed: 06/21/2019 Elsevier Patient Education  2022 Elsevier Inc.  

## 2021-07-23 NOTE — Progress Notes (Signed)
I,Katawbba Wiggins,acting as a Education administrator for [name], MD.,have documented all relevant documentation on the behalf of [name], MD,as directed by  [name], MD while in the presence of [name], MD.  This visit occurred during the SARS-CoV-2 public health emergency.  Safety protocols were in place, including screening questions prior to the visit, additional usage of staff PPE, and extensive cleaning of exam room while observing appropriate contact time as indicated for disinfecting solutions.  Subjective:     Patient ID: Adam Wiley , male    DOB: 12-24-1975 , 46 y.o.   MRN: [id number]   Chief Complaint  Patient presents with   Hypertension    HPI  The patient is here today for a blood pressure/DM  f/u. He reports compliance with meds. He denies headaches, chest pain and shortness of breath. He has started new cholesterol supplement. He wants to have cholesterol checked today.   Hypertension This is a chronic problem. The current episode started more than 1 year ago. The problem has been gradually improving since onset. The problem is controlled. Pertinent negatives include no blurred vision or chest pain. Past treatments include ACE inhibitors and diuretics. The current treatment provides moderate improvement.  Diabetes He presents for his follow-up diabetic visit. He has type 2 diabetes mellitus. His disease course has been improving. There are no hypoglycemic associated symptoms. Pertinent negatives for diabetes include no blurred vision and no chest pain. There are no hypoglycemic complications. Risk factors for coronary artery disease include diabetes mellitus, hypertension and male sex. He is compliant with treatment most of the time. He is following a diabetic diet. He participates in exercise three times a week. His home blood glucose trend is fluctuating minimally. His breakfast blood glucose is taken between 7-8 am. His breakfast blood glucose range is  generally 110-130 mg/dl. An ACE inhibitor/angiotensin II receptor blocker is being taken. c   Past Medical History:  Diagnosis Date   Diabetes mellitus type 2 in nonobese ([place]) 03/07/2020   Diabetes mellitus without complication ([place])    Exertional dyspnea 03/07/2020   Hypertension    Pure hypercholesterolemia 03/07/2020   Tobacco abuse 03/07/2020     Family History  Problem Relation Age of Onset   Diabetes Mother    Diabetes Father    High blood pressure Other    Hypertension Brother    Hypertension Sister    Diabetes Sister      Current Outpatient Medications:    esomeprazole (NEXIUM) 40 MG capsule, TAKE 1 CAPSULE BY MOUTH DAILY, Disp: 90 capsule, Rfl: 1   fluticasone (FLONASE) 50 MCG/ACT nasal spray, Place 2 sprays into both nostrils daily., Disp: 16 g, Rfl: 1   lisinopril-hydrochlorothiazide (ZESTORETIC) 20-12.5 MG tablet, Take 1 tablet by mouth daily., Disp: 90 tablet, Rfl: 1   OVER THE COUNTER MEDICATION, lipodine, Disp: , Rfl:    Saxagliptin-Metformin (KOMBIGLYZE XR) 11-998 MG TB24, TAKE ONE TABLET BY MOUTH DAILY., Disp: 30 tablet, Rfl: 3   traMADol (ULTRAM) 50 MG tablet, Take by mouth every 6 (six) hours as needed., Disp: , Rfl:    albuterol (VENTOLIN HFA) 108 (90 Base) MCG/ACT inhaler, Inhale 2 puffs into the lungs every 4 to 6 hours for 15 days. (Patient not taking: Reported on 07/23/2021), Disp: 18 g, Rfl: 0   [place]RI[place] 5-2.5-18.5 LF-MCG/0.5 injection, , Disp: , Rfl:    fexofenadine (ALLEGRA) 180 MG tablet, Take 180 mg by mouth daily., Disp: , Rfl:    No Known Allergies  Review of Systems  Constitutional: Negative.   Eyes:  Negative for blurred vision.  Respiratory: Negative.    Cardiovascular: Negative.  Negative for chest pain.  Gastrointestinal: Negative.   Neurological: Negative.   Psychiatric/Behavioral: Negative.      Today's Vitals   07/23/21 1057  BP: 132/78  Pulse: 74  Temp: 98.1 F (36.7 C)  Weight: 182 lb (82.6 kg)  Height: _0  (1.803 m)    Body mass index is 25.38 kg/m.  Wt Readings from Last 3 Encounters:  07/23/21 182 lb (82.6 kg)  03/20/21 183 lb (83 kg)  10/22/20 185 lb 12.8 oz (84.3 kg)    BP Readings from Last 3 Encounters:  07/23/21 132/78  03/20/21 138/80  10/22/20 124/80    Objective:  Physical Exam Vitals and nursing note reviewed.  Constitutional:      Appearance: Normal appearance.  HENT:     Head: Normocephalic and atraumatic.     Nose:     Comments: Masked     Mouth/Throat:     Comments: Masked  Eyes:     Extraocular Movements: Extraocular movements intact.  Cardiovascular:     Rate and Rhythm: Normal rate and regular rhythm.     Heart sounds: Normal heart sounds.  Pulmonary:     Effort: Pulmonary effort is normal.     Breath sounds: Normal breath sounds.  Musculoskeletal:     Cervical back: Normal range of motion.  Skin:    General: Skin is warm.  Neurological:     General: No focal deficit present.     Mental Status: He is alert.  Psychiatric:        Mood and Affect: Mood normal.        Assessment And Plan:     1. Essential hypertension, benign Comments: Chronic, fair control. Goal BP <130/80. Advised to follow a low sodium diet. No med changes today.  - Lipid panel - Hemoglobin A1c - CMP14+EGFR  2. Uncontrolled type 2 diabetes mellitus with hyperglycemia (HCC) Comments: Chronic, I will check labs as below. Importance of dietary/medication compliance was discussed with the patient.  - CMP14+EGFR  3. Pure hypercholesterolemia Comments: Chronic, I will check fasting lipid panel. He declines statin therapy at this time. LDL goal <70.  - Lipid panel  4. BMI 25.0-25.9,adult Comments: He is encouraged to aim for at least 150 minutes of exercise per week.   Patient was given opportunity to ask questions. Patient verbalized understanding of the plan and was able to repeat key elements of the plan. All questions were answered to their satisfaction.   I, [name], MD,  have reviewed all documentation for this visit. The documentation on 07/23/21 for the exam, diagnosis, procedures, and orders are all accurate and complete.   IF YOU HAVE BEEN REFERRED TO A SPECIALIST, IT MAY TAKE 1-2 WEEKS TO SCHEDULE/PROCESS THE REFERRAL. IF YOU HAVE NOT HEARD FROM US/SPECIALIST IN TWO WEEKS, PLEASE GIVE [place] A CALL AT [phone] X [phone].   THE PATIENT IS ENCOURAGED TO PRACTICE SOCIAL DISTANCING DUE TO THE COVID-19 PANDEMIC.

## 2021-07-24 LAB — CMP14+EGFR
ALT: 67 IU/L — ABNORMAL HIGH (ref 0–44)
AST: 40 IU/L (ref 0–40)
Albumin/Globulin Ratio: 2.2 (ref 1.2–2.2)
Albumin: 4.6 g/dL (ref 4.0–5.0)
Alkaline Phosphatase: 98 IU/L (ref 44–121)
BUN/Creatinine Ratio: 18 (ref 9–20)
BUN: 17 mg/dL (ref 6–24)
Bilirubin Total: 0.4 mg/dL (ref 0.0–1.2)
CO2: 27 mmol/L (ref 20–29)
Calcium: 11 mg/dL — ABNORMAL HIGH (ref 8.7–10.2)
Chloride: 99 mmol/L (ref 96–106)
Creatinine, Ser: 0.96 mg/dL (ref 0.76–1.27)
Globulin, Total: 2.1 g/dL (ref 1.5–4.5)
Glucose: 170 mg/dL — ABNORMAL HIGH (ref 70–99)
Potassium: 4.5 mmol/L (ref 3.5–5.2)
Sodium: 140 mmol/L (ref 134–144)
Total Protein: 6.7 g/dL (ref 6.0–8.5)
eGFR: 98 mL/min/{1.73_m2} (ref >59)

## 2021-07-24 LAB — LIPID PANEL
Chol/HDL Ratio: 7 ratio — ABNORMAL HIGH (ref 0.0–5.0)
Cholesterol, Total: 316 mg/dL — ABNORMAL HIGH (ref 100–199)
HDL: 45 mg/dL (ref >39)
LDL Chol Calc (NIH): 159 mg/dL — ABNORMAL HIGH (ref 0–99)
Triglycerides: 572 mg/dL (ref 0–149)
VLDL Cholesterol Cal: 112 mg/dL — ABNORMAL HIGH (ref 5–40)

## 2021-07-24 LAB — HEMOGLOBIN A1C
Est. average glucose Bld gHb Est-mCnc: 217 mg/dL
Hgb A1c MFr Bld: 9.2 % — ABNORMAL HIGH (ref 4.8–5.6)

## 2021-07-28 ENCOUNTER — Other Ambulatory Visit (HOSPITAL_COMMUNITY): Payer: Self-pay

## 2021-08-12 ENCOUNTER — Encounter: Payer: Self-pay | Admitting: Internal Medicine

## 2021-08-12 LAB — HM DIABETES EYE EXAM

## 2021-08-13 ENCOUNTER — Other Ambulatory Visit: Payer: Self-pay | Admitting: Internal Medicine

## 2021-08-13 ENCOUNTER — Other Ambulatory Visit (HOSPITAL_COMMUNITY): Payer: Self-pay

## 2021-08-14 ENCOUNTER — Other Ambulatory Visit (HOSPITAL_COMMUNITY): Payer: Self-pay

## 2021-08-15 ENCOUNTER — Other Ambulatory Visit: Payer: Self-pay

## 2021-08-15 ENCOUNTER — Other Ambulatory Visit (HOSPITAL_COMMUNITY): Payer: Self-pay

## 2021-08-15 MED ORDER — ATORVASTATIN CALCIUM 40 MG PO TABS
40.0000 mg | ORAL_TABLET | Freq: Every day | ORAL | 1 refills | Status: DC
Start: 2021-08-15 — End: 2021-11-27
  Filled 2021-08-15: qty 90, 90d supply, fill #0

## 2021-08-15 MED ORDER — FEXOFENADINE HCL 180 MG PO TABS
180.0000 mg | ORAL_TABLET | Freq: Every day | ORAL | 1 refills | Status: DC
  Filled 2021-08-15: qty 90, 90d supply, fill #0
  Filled 2022-05-26: qty 30, 30d supply, fill #0

## 2021-08-20 ENCOUNTER — Other Ambulatory Visit (HOSPITAL_COMMUNITY): Payer: Self-pay

## 2021-08-28 ENCOUNTER — Other Ambulatory Visit (HOSPITAL_COMMUNITY): Payer: Self-pay

## 2021-08-29 ENCOUNTER — Other Ambulatory Visit (HOSPITAL_COMMUNITY): Payer: Self-pay

## 2021-09-16 ENCOUNTER — Other Ambulatory Visit (HOSPITAL_COMMUNITY): Payer: Self-pay

## 2021-09-29 ENCOUNTER — Other Ambulatory Visit: Payer: Self-pay

## 2021-09-29 ENCOUNTER — Other Ambulatory Visit (HOSPITAL_COMMUNITY): Payer: Self-pay

## 2021-09-29 ENCOUNTER — Telehealth: Payer: Self-pay

## 2021-09-29 ENCOUNTER — Other Ambulatory Visit: Payer: 59

## 2021-09-29 DIAGNOSIS — E7800 Pure hypercholesterolemia, unspecified: Secondary | ICD-10-CM

## 2021-09-29 MED ORDER — KOMBIGLYZE XR 5-1000 MG PO TB24
1.0000 | ORAL_TABLET | Freq: Every day | ORAL | 3 refills | Status: DC
  Filled 2021-09-29: qty 30, 30d supply, fill #0
  Filled 2021-10-29: qty 30, 30d supply, fill #1

## 2021-09-29 NOTE — Telephone Encounter (Signed)
Hey, this pt Is requesting a refill on tramadol for his shoulder pain. YL,RMA ?

## 2021-09-30 ENCOUNTER — Other Ambulatory Visit: Payer: Self-pay | Admitting: Internal Medicine

## 2021-09-30 ENCOUNTER — Encounter: Payer: Self-pay | Admitting: Internal Medicine

## 2021-09-30 ENCOUNTER — Other Ambulatory Visit (HOSPITAL_COMMUNITY): Payer: Self-pay

## 2021-09-30 LAB — LIPID PANEL
Chol/HDL Ratio: 3.5 ratio (ref 0.0–5.0)
Cholesterol, Total: 155 mg/dL (ref 100–199)
HDL: 44 mg/dL (ref >39)
LDL Chol Calc (NIH): 72 mg/dL (ref 0–99)
Triglycerides: 240 mg/dL — ABNORMAL HIGH (ref 0–149)
VLDL Cholesterol Cal: 39 mg/dL (ref 5–40)

## 2021-10-03 ENCOUNTER — Other Ambulatory Visit: Payer: Self-pay | Admitting: Internal Medicine

## 2021-10-03 ENCOUNTER — Other Ambulatory Visit (HOSPITAL_COMMUNITY): Payer: Self-pay

## 2021-10-03 MED ORDER — TRAMADOL HCL 50 MG PO TABS
50.0000 mg | ORAL_TABLET | Freq: Two times a day (BID) | ORAL | 0 refills | Status: DC | PRN
  Filled 2021-10-03: qty 30, 15d supply, fill #0

## 2021-10-04 LAB — ALT: ALT: 27 IU/L (ref 0–44)

## 2021-10-04 LAB — SPECIMEN STATUS REPORT

## 2021-10-30 ENCOUNTER — Other Ambulatory Visit (HOSPITAL_COMMUNITY): Payer: Self-pay

## 2021-10-31 ENCOUNTER — Emergency Department (HOSPITAL_COMMUNITY)
Admission: EM | Admit: 2021-10-31 | Discharge: 2021-11-01 | Disposition: A | Discharge status: Home / Self Care | Payer: No Typology Code available for payment source | Attending: Emergency Medicine | Admitting: Emergency Medicine

## 2021-10-31 ENCOUNTER — Encounter (HOSPITAL_COMMUNITY): Payer: Self-pay

## 2021-10-31 ENCOUNTER — Emergency Department (HOSPITAL_COMMUNITY): Payer: No Typology Code available for payment source

## 2021-10-31 ENCOUNTER — Other Ambulatory Visit: Payer: Self-pay

## 2021-10-31 DIAGNOSIS — Z7984 Long term (current) use of oral hypoglycemic drugs: Secondary | ICD-10-CM | POA: Diagnosis not present

## 2021-10-31 DIAGNOSIS — R0602 Shortness of breath: Secondary | ICD-10-CM | POA: Diagnosis not present

## 2021-10-31 DIAGNOSIS — Z79899 Other long term (current) drug therapy: Secondary | ICD-10-CM | POA: Diagnosis not present

## 2021-10-31 DIAGNOSIS — I10 Essential (primary) hypertension: Secondary | ICD-10-CM | POA: Diagnosis not present

## 2021-10-31 DIAGNOSIS — F17210 Nicotine dependence, cigarettes, uncomplicated: Secondary | ICD-10-CM | POA: Insufficient documentation

## 2021-10-31 DIAGNOSIS — R202 Paresthesia of skin: Secondary | ICD-10-CM | POA: Insufficient documentation

## 2021-10-31 DIAGNOSIS — E119 Type 2 diabetes mellitus without complications: Secondary | ICD-10-CM | POA: Insufficient documentation

## 2021-10-31 DIAGNOSIS — R079 Chest pain, unspecified: Secondary | ICD-10-CM | POA: Diagnosis present

## 2021-10-31 LAB — CBC
HCT: 40.4 % (ref 39.0–52.0)
Hemoglobin: 13.7 g/dL (ref 13.0–17.0)
MCH: 30.7 pg (ref 26.0–34.0)
MCHC: 33.9 g/dL (ref 30.0–36.0)
MCV: 90.6 fL (ref 80.0–100.0)
Platelets: 268 10*3/uL (ref 150–400)
RBC: 4.46 MIL/uL (ref 4.22–5.81)
RDW: 12.9 % (ref 11.5–15.5)
WBC: 7.7 10*3/uL (ref 4.0–10.5)
nRBC: 0 % (ref 0.0–0.2)

## 2021-10-31 LAB — BASIC METABOLIC PANEL
Anion gap: 7 (ref 5–15)
BUN: 10 mg/dL (ref 6–20)
CO2: 23 mmol/L (ref 22–32)
Calcium: 9.3 mg/dL (ref 8.9–10.3)
Chloride: 108 mmol/L (ref 98–111)
Creatinine, Ser: 1 mg/dL (ref 0.61–1.24)
GFR, Estimated: >60 mL/min (ref >60)
Glucose, Bld: 198 mg/dL — ABNORMAL HIGH (ref 70–99)
Potassium: 3.3 mmol/L — ABNORMAL LOW (ref 3.5–5.1)
Sodium: 138 mmol/L (ref 135–145)

## 2021-10-31 LAB — TROPONIN I (HIGH SENSITIVITY): Troponin I (High Sensitivity): 4 ng/L (ref <18)

## 2021-10-31 MED ORDER — MORPHINE SULFATE (PF) 4 MG/ML IV SOLN
4.0000 mg | Freq: Once | INTRAVENOUS | Status: AC
  Administered 2021-10-31: 4 mg via INTRAVENOUS
  Filled 2021-10-31: qty 1

## 2021-10-31 MED ORDER — POTASSIUM CHLORIDE CRYS ER 20 MEQ PO TBCR
30.0000 meq | EXTENDED_RELEASE_TABLET | Freq: Once | ORAL | Status: AC
  Administered 2021-10-31: 30 meq via ORAL
  Filled 2021-10-31: qty 1

## 2021-10-31 MED ORDER — ALUM & MAG HYDROXIDE-SIMETH 200-200-20 MG/5ML PO SUSP
30.0000 mL | Freq: Once | ORAL | Status: AC
  Administered 2021-10-31: 30 mL via ORAL
  Filled 2021-10-31: qty 30

## 2021-10-31 MED ORDER — LIDOCAINE VISCOUS HCL 2 % MT SOLN
15.0000 mL | Freq: Once | OROMUCOSAL | Status: AC
  Administered 2021-10-31: 15 mL via ORAL
  Filled 2021-10-31: qty 15

## 2021-10-31 NOTE — ED Provider Notes (Signed)
23:30: Assumed care of patient from PA [facility] @ shift change pending delta troponin- if negative discharge home.  ? ?Please see prior provider note for full H&P. ? ? ?Physical Exam  ?BP (!) 148/93 (BP Location: Left Arm)   Pulse 72   Temp 98.5 ?F (36.9 ?C)   Resp 12   Ht '5\' 11"'$  (1.803 m)   Wt 83.9 kg   SpO2 95%   BMI 25.80 kg/m?  ? ?Physical Exam ?Vitals and nursing note reviewed.  ?Constitutional:   ?   General: He is not in acute distress. ?   Appearance: He is well-developed.  ?HENT:  ?   Head: Normocephalic and atraumatic.  ?Eyes:  ?   General:     ?   Right eye: No discharge.     ?   Left eye: No discharge.  ?   Conjunctiva/sclera: Conjunctivae normal.  ?Pulmonary:  ?   Effort: No respiratory distress.  ?Chest:  ?   Chest wall: Tenderness (Right lower anterior chest wall without overlying skin changes.) present.  ?Neurological:  ?   Mental Status: He is alert.  ?   Comments: Clear speech.   ?Psychiatric:     ?   Behavior: Behavior normal.     ?   Thought Content: Thought content normal.  ? ? ?Procedures  ?Procedures ? ?ED Course / MDM  ? ?Clinical Course as of 10/31/21 2334  ?Fri Oct 31, 2021  ?2216 Pt reports little improvement with GI cocktail. Initial lab work appears reassuring. Will give pt IV morphine and reassess. Will obtain 2nd troponin.  [LJ]  ?  ?Clinical Course User Index ?[LJ] Rayna Sexton, PA-C  ? ?Medical Decision Making ?Amount and/or Complexity of Data Reviewed ?Labs: ordered. ?Radiology: ordered. ? ?Risk ?OTC drugs. ?Prescription drug management. ? ? ?EKG Interpretation ? ?Date/Time:  Friday October 31 2021 21:13:31 EDT ?Ventricular Rate:  78 ?PR Interval:  152 ?QRS Duration: 91 ?QT Interval:  361 ?QTC Calculation: 412 ?R Axis:   52 ?Text Interpretation: Sinus rhythm Confirmed by Isla Pence [phone]) on 10/31/2021 9:42:18 PM ? ?Work-up reviewed, chest x-ray without acute process, labs with mild hypokalemia-orally replaced in the ER, EKG without acute ischemia and troponins are  flat therefore feel ACS is less likely.  PERC negative per prior team, low suspicion for PE.  Unclear definitive etiology to patient's pain, however upon my assessment he states that it does hurt with palpation, position changes, and sometimes with deep breaths, will trial treatment for possible MSK pain.  Close outpatient follow-up.  I discussed results, treatment plan, need for follow-up, and return precautions with the patient and his wife at bedside.  Provided opportunity for questions, they have confirmed understanding and are in agreement. ? ? ? ? ?  ?Amaryllis Dyke, PA-C ?11/01/21 0142 ? ?  ?Quintella Reichert, MD ?11/02/21 [phone] ? ?

## 2021-10-31 NOTE — ED Triage Notes (Signed)
Pt reports with sharp chest pain since this afternoon that hurts worse with deep breaths. Pt states that his left hand is tingling.  ?

## 2021-10-31 NOTE — ED Provider Notes (Signed)
?[facility] DE[facility] ?Provider Note ? ? ?CSN: [id number] ?Arrival date & time: 10/31/21  2107 ? ?  ? ?History ? ?Chief Complaint  ?Patient presents with  ? Chest Pain  ? ? ?Adam Wiley is a 46 y.o. male. ? ?HPI ?Patient is a 46 year old male with a history of diabetes mellitus, hypertension, hyperlipidemia, tobacco abuse, who presents to the emergency department due to right-sided sharp intermittent chest pain that began this afternoon.  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  States that his symptoms wax and wane and worsen with deep breaths.  Also notes mild tingling in the bilateral hands and mild shortness of breath.  No nausea, vomiting, diaphoresis.  States he smokes about 1/2 pack/day.  Denies unilateral leg swelling, hemoptysis, recent surgery/trauma, history of blood clot, hormone use.  States that he drank about 2 alcoholic beverages earlier today but denies any drug use.  Patient does note that he recently began exercising again and was doing pushups earlier today. ? ?Per records, patient had a cardiac CT on September 05, 2020 with findings as noted below: ? ?1. Coronary calcium score of 0. This was 0 percentile for age and ?sex matched control. ?2. Normal coronary origin with co-dominance. ?3. No evidence of CAD. ?4.  Consider non-cardiac causes of chest pain. ?  ?  ? ?Home Medications ?Prior to Admission medications   ?Medication Sig Start Date End Date Taking? Authorizing Provider  ?albuterol (VENTOLIN HFA) 108 (90 Base) MCG/ACT inhaler Inhale 2 puffs into the lungs every 4 to 6 hours for 15 days. ?Patient not taking: Reported on 07/23/2021 06/28/21     ?atorvastatin (LIPITOR) 40 MG tablet Take 1 tablet (40 mg total) by mouth daily. 08/15/21 08/15/22  [name], FNP  ?[place] 5-2.5-18.5 LF-MCG/0.5 injection  03/19/21   [provider]  ?esomeprazole (NEXIUM) 40 MG capsule TAKE 1 CAPSULE BY MOUTH DAILY 08/21/20 08/21/21   Glendale Chard, MD  ?fexofenadine (ALLEGRA) 180 MG tablet Take 1 tablet (180 mg total) by mouth daily. 08/15/21   [name], FNP  ?fluticasone (FLONASE) 50 MCG/ACT nasal spray Place 2 sprays into both nostrils daily. 08/21/20   Glendale Chard, MD  ?lisinopril-hydrochlorothiazide (ZESTORETIC) 20-12.5 MG tablet Take 1 tablet by mouth daily. 05/29/21   Glendale Chard, MD  ?OVER THE COUNTER MEDICATION lipodine    [provider]  ?Saxagliptin-Metformin (KOMBIGLYZE XR) 11-998 MG TB24 Take 1 tablet by mouth daily. 09/29/21   Glendale Chard, MD  ?traMADol (ULTRAM) 50 MG tablet Take 1 tablet (50 mg total) by mouth every 12 (twelve) hours as needed. 10/03/21   Glendale Chard, MD  ?   ? ?Allergies    ?Patient has no known allergies.   ? ?Review of Systems   ?Review of Systems  ?All other systems reviewed and are negative. ?Ten systems reviewed and are negative for acute change, except as noted in the HPI.   ?Physical Exam ?Updated Vital Signs ?BP (!) 148/93 (BP Location: Left Arm)   Pulse 72   Temp 98.5 ?F (36.9 ?C)   Resp 12   Ht '5\' 11"'$  (1.803 m)   Wt 83.9 kg   SpO2 95%   BMI 25.80 kg/m?  ?Physical Exam ?Vitals and nursing note reviewed.  ?Constitutional:   ?   General: He is not in acute distress. ?   Appearance: Normal appearance. He is not ill-appearing, toxic-appearing or diaphoretic.  ?HENT:  ?   Head: Normocephalic and  atraumatic.  ?   Right Ear: External ear normal.  ?   Left Ear: External ear normal.  ?   Nose: Nose normal.  ?   Mouth/Throat:  ?   Mouth: Mucous membranes are moist.  ?   Pharynx: Oropharynx is clear. No oropharyngeal exudate or posterior oropharyngeal erythema.  ?Eyes:  ?   Extraocular Movements: Extraocular movements intact.  ?Cardiovascular:  ?   Rate and Rhythm: Normal rate and regular rhythm.  ?   Pulses: Normal pulses.     ?     Radial pulses are 2+ on the right side and 2+ on the left side.  ?     Dorsalis pedis pulses are 2+ on the right side and 2+ on the left side.  ?   Heart  sounds: Normal heart sounds. Heart sounds not distant. No murmur heard. ?No systolic murmur is present.  ?No diastolic murmur is present.  ?  No friction rub. No gallop. No S3 or S4 sounds.  ?Pulmonary:  ?   Effort: Pulmonary effort is normal. No tachypnea, accessory muscle usage or respiratory distress.  ?   Breath sounds: Normal breath sounds. No stridor. No wheezing, rhonchi or rales.  ?Chest:  ?   Chest wall: No tenderness.  ?   Comments: No chest wall tenderness. ?Abdominal:  ?   General: Abdomen is flat.  ?   Palpations: Abdomen is soft.  ?   Tenderness: There is no abdominal tenderness.  ?Musculoskeletal:     ?   General: Normal range of motion.  ?   Cervical back: Normal range of motion and neck supple. No tenderness.  ?   Right lower leg: No tenderness. No edema.  ?   Left lower leg: No tenderness. No edema.  ?   Comments: No pedal edema.  No calf tenderness.  ?Skin: ?   General: Skin is warm and dry.  ?Neurological:  ?   General: No focal deficit present.  ?   Mental Status: He is alert and oriented to person, place, and time.  ?Psychiatric:     ?   Mood and Affect: Mood normal.     ?   Behavior: Behavior normal.  ? ?ED Results / Procedures / Treatments   ?Labs ?(all labs ordered are listed, but only abnormal results are displayed) ?Labs Reviewed  ?BASIC METABOLIC PANEL - Abnormal; Notable for the following components:  ?    Result Value  ? Potassium 3.3 (*)   ? Glucose, Bld 198 (*)   ? All other components within normal limits  ?CBC  ?TROPONIN I (HIGH SENSITIVITY)  ?TROPONIN I (HIGH SENSITIVITY)  ? ?EKG ?EKG Interpretation ? ?Date/Time:  Friday October 31 2021 21:13:31 EDT ?Ventricular Rate:  78 ?PR Interval:  152 ?QRS Duration: 91 ?QT Interval:  361 ?QTC Calculation: 412 ?R Axis:   52 ?Text Interpretation: Sinus rhythm Confirmed by Isla Pence [phone]) on 10/31/2021 9:42:18 PM ? ?Radiology ?DG Chest 2 View ? ?Result Date: 10/31/2021 ?CLINICAL DATA:  Chest pain EXAM: CHEST - 2 VIEW COMPARISON:  03/26/2013  FINDINGS: The heart size and mediastinal contours are within normal limits. Both lungs are clear. The visualized skeletal structures are unremarkable. IMPRESSION: No active cardiopulmonary disease. Electronically Signed   By: Donavan Foil M.D.   On: 10/31/2021 22:08   ? ?Procedures ?Procedures  ? ?Medications Ordered in ED ?Medications  ?alum & mag hydroxide-simeth (MAALOX/MYLANTA) 200-200-20 MG/5ML suspension 30 mL (30 mLs Oral Given 10/31/21 2148)  ?  And  ?  lidocaine (XYLOCAINE) 2 % viscous mouth solution 15 mL (15 mLs Oral Given 10/31/21 2148)  ?potassium chloride (KLOR-CON M) CR tablet 30 mEq (30 mEq Oral Given 10/31/21 2231)  ?morphine (PF) 4 MG/ML injection 4 mg (4 mg Intravenous Given 10/31/21 2231)  ? ?ED Course/ Medical Decision Making/ A&P ?Clinical Course as of 10/31/21 2308  ?Fri Oct 31, 2021  ?2216 Pt reports little improvement with GI cocktail. Initial lab work appears reassuring. Will give pt IV morphine and reassess. Will obtain 2nd troponin.  [LJ]  ?  ?Clinical Course User Index ?[LJ] Rayna Sexton, PA-C  ? ?                        ?Medical Decision Making ?Amount and/or Complexity of Data Reviewed ?Labs: ordered. ?Radiology: ordered. ? ?Risk ?OTC drugs. ?Prescription drug management. ? ?Pt is a 46 y.o. male who presents to the emergency department due to waxing and waning sharp central chest pain that started earlier today. ? ?Labs: ?CBC without abnormalities. ?BMP with a potassium of 3.3 and glucose of 198. ?Troponin of 4 with a repeat pending. ? ?Imaging: ?Chest x-ray shows "no active cardiopulmonary disease." ? ?ECG: ?Sinus rhythm ? ?I, Rayna Sexton, PA-C, personally reviewed and evaluated these images and lab results as part of my medical decision-making. ? ?On my exam heart is regular rate and rhythm without murmurs, rubs or gallops.  Lungs are clear to auscultation bilaterally.  Heart score of 3.  Low risk Wells score.  PERC negative.  Initial troponin, ECG, and chest x-ray appears  reassuring.  Patient had a cardiac CT last year which appeared reassuring at that time. ? ?It is the end of my shift and patient care is being transferred to [facility].  Patient pending second troponin.  If r

## 2021-11-01 ENCOUNTER — Other Ambulatory Visit (HOSPITAL_COMMUNITY): Payer: Self-pay

## 2021-11-01 LAB — TROPONIN I (HIGH SENSITIVITY): Troponin I (High Sensitivity): 4 ng/L (ref <18)

## 2021-11-01 MED ORDER — METHOCARBAMOL 500 MG PO TABS
500.0000 mg | ORAL_TABLET | Freq: Three times a day (TID) | ORAL | 0 refills | Status: DC | PRN
  Filled 2021-11-01: qty 15, 5d supply, fill #0

## 2021-11-01 MED ORDER — KETOROLAC TROMETHAMINE 15 MG/ML IJ SOLN
15.0000 mg | Freq: Once | INTRAMUSCULAR | Status: AC
  Administered 2021-11-01: 15 mg via INTRAVENOUS
  Filled 2021-11-01: qty 1

## 2021-11-01 MED ORDER — NAPROXEN 500 MG PO TABS
500.0000 mg | ORAL_TABLET | Freq: Two times a day (BID) | ORAL | 0 refills | Status: DC | PRN
  Filled 2021-11-01: qty 15, 8d supply, fill #0

## 2021-11-01 NOTE — Discharge Instructions (Addendum)
You were seen in the emergency department today for chest pain. Your work-up in the emergency department has been overall reassuring. Your labs have been fairly normal and or similar to previous blood work you have had done, your potassium level was mildly low- please include potassium rich foods in your diet and have this rechecked by your primary care provider. Your EKG and the enzyme we use to check your heart did not show an acute heart attack at this time. Your chest x-ray was normal.  ? ?We are sending you home with the following medicines to help with your symptoms:  ?- Naproxen- this is a nonsteroidal anti-inflammatory medication that will help with pain and swelling. Be sure to take this medication as prescribed with food, 1 pill every 12 hours,  It should be taken with food, as it can cause stomach upset, and more seriously, stomach bleeding. Do not take other nonsteroidal anti-inflammatory medications with this such as Advil, Motrin, Aleve, Mobic, Goodie Powder, or Motrin etc..   ? ?- Robaxin- this is the muscle relaxer I have prescribed, this is meant to help with muscle tightness/spasms. Be aware that this medication may make you drowsy therefore the first time you take this it should be at a time you are in an environment where you can rest. Do not drive or operate heavy machinery when taking this medication. Do not drink alcohol or take other sedating medications with this medicine such as narcotics or benzodiazepines.  ? ?You make take Tylenol per over the counter dosing with these medications.  ? ?We have prescribed you new medication(s) today. Discuss the medications prescribed today with your pharmacist as they can have adverse effects and interactions with your other medicines including over the counter and prescribed medications. Seek medical evaluation if you start to experience new or abnormal symptoms after taking one of these medicines, seek care immediately if you start to experience  difficulty breathing, feeling of your throat closing, facial swelling, or rash as these could be indications of a more serious allergic reaction ? ? ?We would like you to follow up closely with your primary care provider and/or the cardiology within 1-3 days. Return to the ER immediately should you experience any new or worsening symptoms including but not limited to return of pain, worsened pain, vomiting, shortness of breath, dizziness, lightheadedness, coughing up blood, passing out, or any other concerns that you may have.  ? ?

## 2021-11-20 ENCOUNTER — Encounter: Payer: Self-pay | Admitting: Internal Medicine

## 2021-11-20 ENCOUNTER — Ambulatory Visit (INDEPENDENT_AMBULATORY_CARE_PROVIDER_SITE_OTHER): Payer: No Typology Code available for payment source | Admitting: Internal Medicine

## 2021-11-20 VITALS — BP 130/80 | HR 72 | Temp 98.2°F | Ht 71.0 in | Wt 183.0 lb

## 2021-11-20 DIAGNOSIS — E1165 Type 2 diabetes mellitus with hyperglycemia: Secondary | ICD-10-CM | POA: Diagnosis not present

## 2021-11-20 DIAGNOSIS — E876 Hypokalemia: Secondary | ICD-10-CM

## 2021-11-20 DIAGNOSIS — Z6825 Body mass index (BMI) 25.0-25.9, adult: Secondary | ICD-10-CM | POA: Diagnosis not present

## 2021-11-20 DIAGNOSIS — I10 Essential (primary) hypertension: Secondary | ICD-10-CM

## 2021-11-20 NOTE — Patient Instructions (Addendum)
Jardiance - medication to help decrease cardiac/kidney disease ? ?Hypertension, Adult ?Hypertension is another name for high blood pressure. High blood pressure forces your heart to work harder to pump blood. This can cause problems over time. ?There are two numbers in a blood pressure reading. There is a top number (systolic) over a bottom number (diastolic). It is best to have a blood pressure that is below 120/80. ?What are the causes? ?The cause of this condition is not known. Some other conditions can lead to high blood pressure. ?What increases the risk? ?Some lifestyle factors can make you more likely to develop high blood pressure: ?Smoking. ?Not getting enough exercise or physical activity. ?Being overweight. ?Having too much fat, sugar, calories, or salt (sodium) in your diet. ?Drinking too much alcohol. ?Other risk factors include: ?Having any of these conditions: ?Heart disease. ?Diabetes. ?High cholesterol. ?Kidney disease. ?Obstructive sleep apnea. ?Having a family history of high blood pressure and high cholesterol. ?Age. The risk increases with age. ?Stress. ?What are the signs or symptoms? ?High blood pressure may not cause symptoms. Very high blood pressure (hypertensive crisis) may cause: ?Headache. ?Fast or uneven heartbeats (palpitations). ?Shortness of breath. ?Nosebleed. ?Vomiting or feeling like you may vomit (nauseous). ?Changes in how you see. ?Very bad chest pain. ?Feeling dizzy. ?Seizures. ?How is this treated? ?This condition is treated by making healthy lifestyle changes, such as: ?Eating healthy foods. ?Exercising more. ?Drinking less alcohol. ?Your doctor may prescribe medicine if lifestyle changes do not help enough and if: ?Your top number is above 130. ?Your bottom number is above 80. ?Your personal target blood pressure may vary. ?Follow these instructions at home: ?Eating and drinking ? ?If told, follow the DASH eating plan. To follow this plan: ?Fill one half of your plate at  each meal with fruits and vegetables. ?Fill one fourth of your plate at each meal with whole grains. Whole grains include whole-wheat pasta, brown rice, and whole-grain bread. ?Eat or drink low-fat dairy products, such as skim milk or low-fat yogurt. ?Fill one fourth of your plate at each meal with low-fat (lean) proteins. Low-fat proteins include fish, chicken without skin, eggs, beans, and tofu. ?Avoid fatty meat, cured and processed meat, or chicken with skin. ?Avoid pre-made or processed food. ?Limit the amount of salt in your diet to less than 1,500 mg each day. ?Do not drink alcohol if: ?Your doctor tells you not to drink. ?You are pregnant, may be pregnant, or are planning to become pregnant. ?If you drink alcohol: ?Limit how much you have to: ?0-1 drink a day for women. ?0-2 drinks a day for men. ?Know how much alcohol is in your drink. In the [place] one drink equals one 12 oz bottle of beer (355 mL), one 5 oz glass of wine (148 mL), or one 1? oz glass of hard liquor (44 mL). ?Lifestyle ? ?Work with your doctor to stay at a healthy weight or to lose weight. Ask your doctor what the best weight is for you. ?Get at least 30 minutes of exercise that causes your heart to beat faster (aerobic exercise) most days of the week. This may include walking, swimming, or biking. ?Get at least 30 minutes of exercise that strengthens your muscles (resistance exercise) at least 3 days a week. This may include lifting weights or doing Pilates. ?Do not smoke or use any products that contain nicotine or tobacco. If you need help quitting, ask your doctor. ?Check your blood pressure at home as told by your doctor. ?Keep  all follow-up visits. ?Medicines ?Take over-the-counter and prescription medicines only as told by your doctor. Follow directions carefully. ?Do not skip doses of blood pressure medicine. The medicine does not work as well if you skip doses. Skipping doses also puts you at risk for problems. ?Ask your doctor  about side effects or reactions to medicines that you should watch for. ?Contact a doctor if: ?You think you are having a reaction to the medicine you are taking. ?You have headaches that keep coming back. ?You feel dizzy. ?You have swelling in your ankles. ?You have trouble with your vision. ?Get help right away if: ?You get a very bad headache. ?You start to feel mixed up (confused). ?You feel weak or numb. ?You feel faint. ?You have very bad pain in your: ?Chest. ?Belly (abdomen). ?You vomit more than once. ?You have trouble breathing. ?These symptoms may be an emergency. Get help right away. Call 911. ?Do not wait to see if the symptoms will go away. ?Do not drive yourself to the hospital. ?Summary ?Hypertension is another name for high blood pressure. ?High blood pressure forces your heart to work harder to pump blood. ?For most people, a normal blood pressure is less than 120/80. ?Making healthy choices can help lower blood pressure. If your blood pressure does not get lower with healthy choices, you may need to take medicine. ?This information is not intended to replace advice given to you by your health care provider. Make sure you discuss any questions you have with your health care provider. ?Document Revised: 04/17/2021 Document Reviewed: 04/17/2021 ?Elsevier Patient Education ? [place]. ? ?

## 2021-11-20 NOTE — Progress Notes (Signed)
?Rich Brave [place]ittleton,acting as a Education administrator for [name], MD.,have documented all relevant documentation on the behalf of [name], MD,as directed by  [name], MD while in the presence of [name], MD.  ?This visit occurred during the SARS-CoV-2 public health emergency.  Safety protocols were in place, including screening questions prior to the visit, additional usage of staff PPE, and extensive cleaning of exam room while observing appropriate contact time as indicated for disinfecting solutions. ? ?Subjective:  ?  ? Patient ID: Adam Wiley , male    DOB: Oct 26, 1975 , 46 y.o.   MRN: [id number] ? ? ?Chief Complaint  ?Patient presents with  ? Diabetes  ? Hypertension  ? ? ?HPI ? ?The patient is here today for a blood pressure/DM  f/u. He reports compliance with meds. He denies headaches, palpitations and shortness of breath.  ? ?Since his last visit, he did have an episode of chest pain on 4/21 that required an ER visit. Cardiac workup negative and he was diagnosed with a pulled muscle. He has not had any further symptoms.  ? ?Diabetes ?He presents for his follow-up diabetic visit. He has type 2 diabetes mellitus. His disease course has been improving. There are no hypoglycemic associated symptoms. Pertinent negatives for diabetes include no blurred vision and no chest pain. There are no hypoglycemic complications. Risk factors for coronary artery disease include diabetes mellitus, hypertension and male sex. He is compliant with treatment most of the time. He is following a diabetic diet. He participates in exercise three times a week. His home blood glucose trend is fluctuating minimally. His breakfast blood glucose is taken between 7-8 am. His breakfast blood glucose range is generally 110-130 mg/dl. An ACE inhibitor/angiotensin II receptor blocker is being taken.  ?Hypertension ?This is a chronic problem. The current episode started more than 1 year ago. The problem has been gradually  improving since onset. The problem is controlled. Pertinent negatives include no blurred vision or chest pain. Past treatments include ACE inhibitors and diuretics. The current treatment provides moderate improvement.   ? ?Past Medical History:  ?Diagnosis Date  ? Diabetes mellitus type 2 in nonobese ([place]) 03/07/2020  ? Diabetes mellitus without complication ([place])   ? Exertional dyspnea 03/07/2020  ? Hypertension   ? Pure hypercholesterolemia 03/07/2020  ? Tobacco abuse 03/07/2020  ?  ? ?Family History  ?Problem Relation Age of Onset  ? Diabetes Mother   ? Diabetes Father   ? High blood pressure Other   ? Hypertension Brother   ? Hypertension Sister   ? Diabetes Sister   ? ? ? ?Current Outpatient Medications:  ?  atorvastatin (LIPITOR) 40 MG tablet, Take 1 tablet (40 mg total) by mouth daily., Disp: 90 tablet, Rfl: 1 ?  esomeprazole (NEXIUM) 40 MG capsule, TAKE 1 CAPSULE BY MOUTH DAILY, Disp: 90 capsule, Rfl: 1 ?  fexofenadine (ALLEGRA) 180 MG tablet, Take 1 tablet (180 mg total) by mouth daily., Disp: 90 tablet, Rfl: 1 ?  fluticasone (FLONASE) 50 MCG/ACT nasal spray, Place 2 sprays into both nostrils daily., Disp: 16 g, Rfl: 1 ?  lisinopril-hydrochlorothiazide (ZESTORETIC) 20-12.5 MG tablet, Take 1 tablet by mouth daily., Disp: 90 tablet, Rfl: 1 ?  methocarbamol (ROBAXIN) 500 MG tablet, Take 1 tablet (500 mg total) by mouth every 8 (eight) hours as needed for muscle spasms., Disp: 15 tablet, Rfl: 0 ?  naproxen (NAPROSYN) 500 MG tablet, Take 1 tablet (500 mg total) by mouth 2 (two) times  daily as needed for moderate pain., Disp: 15 tablet, Rfl: 0 ?  OVER THE COUNTER MEDICATION, lipodine, Disp: , Rfl:  ?  Saxagliptin-Metformin (KOMBIGLYZE XR) 11-998 MG TB24, Take 1 tablet by mouth daily., Disp: 30 tablet, Rfl: 3 ?  traMADol (ULTRAM) 50 MG tablet, Take 1 tablet (50 mg total) by mouth every 12 (twelve) hours as needed., Disp: 30 tablet, Rfl: 0 ?  albuterol (VENTOLIN HFA) 108 (90 Base) MCG/ACT inhaler, Inhale 2 puffs into  the lungs every 4 to 6 hours for 15 days. (Patient not taking: Reported on 07/23/2021), Disp: 18 g, Rfl: 0  ? ?No Known Allergies  ? ?Review of Systems  ?Constitutional: Negative.   ?Eyes:  Negative for blurred vision.  ?Respiratory: Negative.    ?Cardiovascular: Negative.  Negative for chest pain.  ?Gastrointestinal: Negative.   ?Neurological: Negative.   ?Psychiatric/Behavioral: Negative.     ? ?Today's Vitals  ? 11/20/21 0928  ?BP: 130/80  ?Pulse: 72  ?Temp: 98.2 ?F (36.8 ?C)  ?Weight: 183 lb (83 kg)  ?Height: $RemoveB'5\' 11"'HOZliZUQ$  (1.803 m)  ?PainSc: 0-No pain  ? ?Body mass index is 25.52 kg/m?.  ?Wt Readings from Last 3 Encounters:  ?11/20/21 183 lb (83 kg)  ?10/31/21 185 lb (83.9 kg)  ?07/23/21 182 lb (82.6 kg)  ?  ? ?Objective:  ?Physical Exam ?Vitals and nursing note reviewed.  ?Constitutional:   ?   Appearance: Normal appearance.  ?HENT:  ?   Head: Normocephalic and atraumatic.  ?Eyes:  ?   Extraocular Movements: Extraocular movements intact.  ?Cardiovascular:  ?   Rate and Rhythm: Normal rate and regular rhythm.  ?   Heart sounds: Normal heart sounds.  ?Pulmonary:  ?   Effort: Pulmonary effort is normal.  ?   Breath sounds: Normal breath sounds.  ?Musculoskeletal:  ?   Cervical back: Normal range of motion.  ?Skin: ?   General: Skin is warm.  ?Neurological:  ?   General: No focal deficit present.  ?   Mental Status: He is alert.  ?Psychiatric:     ?   Mood and Affect: Mood normal.  ?    ?Assessment And Plan:  ?   ?1. Uncontrolled type 2 diabetes mellitus with hyperglycemia ([place]) ?Comments: I will check labs as below.. I will adjust meds as needed. He was congratulated on his lifestyle changes and hopefully there is an improvement in his a1c. I also discussed the use of Jardiance to decrease renal/cardiac risk. He would prefer to discuss further after going over lab results.  ?- Hemoglobin A1c ? ?2. Essential hypertension, benign ?Comments: Chronic, controlled. Goal BP<130/80. He will c/w losartan and HCTZ. He will rto in  4 months for physical exam.  ?- BMP8+EGFR ? ?3. Hypokalemia ?Comments: Potassium level was slightly low in ER. I will check potassium level today. ? ?4. BMI 25.0-25.9,adult ?Comments: He is encouraged to aim for at least 150 minutes of exercise per week.  ?  ?Patient was given opportunity to ask questions. Patient verbalized understanding of the plan and was able to repeat key elements of the plan. All questions were answered to their satisfaction.  ? ?I, [name], MD, have reviewed all documentation for this visit. The documentation on 11/20/21 for the exam, diagnosis, procedures, and orders are all accurate and complete.  ? ?IF YOU HAVE BEEN REFERRED TO A SPECIALIST, IT MAY TAKE 1-2 WEEKS TO SCHEDULE/PROCESS THE REFERRAL. IF YOU HAVE NOT HEARD FROM US/SPECIALIST IN TWO WEEKS, PLEASE GIVE [place] A CALL  AT [phone].  ? ?THE PATIENT IS ENCOURAGED TO PRACTICE SOCIAL DISTANCING DUE TO THE COVID-19 PANDEMIC.   ?

## 2021-11-21 LAB — HEMOGLOBIN A1C
Est. average glucose Bld gHb Est-mCnc: 160 mg/dL
Hgb A1c MFr Bld: 7.2 % — ABNORMAL HIGH (ref 4.8–5.6)

## 2021-11-21 LAB — BMP8+EGFR
BUN/Creatinine Ratio: 12 (ref 9–20)
BUN: 13 mg/dL (ref 6–24)
CO2: 21 mmol/L (ref 20–29)
Calcium: 10.2 mg/dL (ref 8.7–10.2)
Chloride: 106 mmol/L (ref 96–106)
Creatinine, Ser: 1.05 mg/dL (ref 0.76–1.27)
Glucose: 121 mg/dL — ABNORMAL HIGH (ref 70–99)
Potassium: 4.5 mmol/L (ref 3.5–5.2)
Sodium: 145 mmol/L — ABNORMAL HIGH (ref 134–144)
eGFR: 88 mL/min/{1.73_m2} (ref >59)

## 2021-11-27 ENCOUNTER — Other Ambulatory Visit: Payer: Self-pay

## 2021-11-27 ENCOUNTER — Other Ambulatory Visit (HOSPITAL_COMMUNITY): Payer: Self-pay

## 2021-11-27 MED ORDER — LISINOPRIL-HYDROCHLOROTHIAZIDE 20-12.5 MG PO TABS
1.0000 | ORAL_TABLET | Freq: Every day | ORAL | 1 refills | Status: DC
  Filled 2021-11-27: qty 90, 90d supply, fill #0
  Filled 2022-03-03: qty 90, 90d supply, fill #1

## 2021-11-27 MED ORDER — ATORVASTATIN CALCIUM 40 MG PO TABS
40.0000 mg | ORAL_TABLET | Freq: Every day | ORAL | 1 refills | Status: DC
  Filled 2021-11-27: qty 90, 90d supply, fill #0
  Filled 2022-03-03: qty 90, 90d supply, fill #1

## 2021-12-15 ENCOUNTER — Encounter: Payer: Self-pay | Admitting: Nurse Practitioner

## 2021-12-15 ENCOUNTER — Other Ambulatory Visit (HOSPITAL_COMMUNITY): Payer: Self-pay

## 2021-12-15 ENCOUNTER — Ambulatory Visit (INDEPENDENT_AMBULATORY_CARE_PROVIDER_SITE_OTHER): Payer: No Typology Code available for payment source | Admitting: Nurse Practitioner

## 2021-12-15 VITALS — BP 130/70 | HR 92 | Temp 98.7°F | Ht 71.0 in | Wt 178.0 lb

## 2021-12-15 DIAGNOSIS — E1165 Type 2 diabetes mellitus with hyperglycemia: Secondary | ICD-10-CM | POA: Diagnosis not present

## 2021-12-15 DIAGNOSIS — J3489 Other specified disorders of nose and nasal sinuses: Secondary | ICD-10-CM

## 2021-12-15 DIAGNOSIS — J302 Other seasonal allergic rhinitis: Secondary | ICD-10-CM

## 2021-12-15 MED ORDER — OLOPATADINE HCL 0.2 % OP SOLN
2.0000 [drp] | Freq: Every day | OPHTHALMIC | 2 refills | Status: DC
  Filled 2021-12-15 – 2022-05-26 (×2): qty 2.5, 25d supply, fill #0

## 2021-12-15 MED ORDER — FLUTICASONE PROPIONATE 50 MCG/ACT NA SUSP: 2.0000 | Freq: Every day | NASAL | 2 refills | Status: DC

## 2021-12-15 MED ORDER — FLUTICASONE PROPIONATE 50 MCG/ACT NA SUSP
2.0000 | Freq: Every day | NASAL | 2 refills | Status: DC
  Filled 2021-12-15: qty 16, 30d supply, fill #0
  Filled 2022-05-26: qty 16, 30d supply, fill #1

## 2021-12-15 MED ORDER — SYNJARDY 12.5-500 MG PO TABS
1.0000 | ORAL_TABLET | Freq: Two times a day (BID) | ORAL | 1 refills | Status: DC
  Filled 2021-12-15: qty 180, 90d supply, fill #0
  Filled 2021-12-29: qty 60, 30d supply, fill #0

## 2021-12-15 MED ORDER — AMOXICILLIN-POT CLAVULANATE 875-125 MG PO TABS
1.0000 | ORAL_TABLET | Freq: Two times a day (BID) | ORAL | 0 refills | Status: DC
  Filled 2021-12-15: qty 14, 7d supply, fill #0

## 2021-12-15 MED ORDER — EMPAGLIFLOZIN 10 MG PO TABS
10.0000 mg | ORAL_TABLET | Freq: Every day | ORAL | 1 refills | Status: DC
  Filled 2021-12-15: qty 90, 90d supply, fill #0

## 2021-12-15 NOTE — Progress Notes (Signed)
I,Adam Wiley,acting as a Education administrator for [employer], FNP.,have documented all relevant documentation on the behalf of Adam Brine, FNP,as directed by  Adam Brine, FNP while in the presence of Adam Wiley, [place].  This visit occurred during the SARS-CoV-2 public health emergency.  Safety protocols were in place, including screening questions prior to the visit, additional usage of staff PPE, and extensive cleaning of exam room while observing appropriate contact time as indicated for disinfecting solutions.  Subjective:     Patient ID: Adam Wiley , male    DOB: 1976-05-13 , 46 y.o.   MRN: [id number]   Chief Complaint  Patient presents with   Sinusitis    HPI  Patient presents today for sinus pressure. The last time he had these type of symptoms he was treated with azithromycin but did not help. Seen at an urgent care and given another antibiotic. He would have itching eyes. Reports his blood sugar has been good since being sick. His chest also feels congested. He did a home covid test done on Friday.   Sinusitis This is a recurrent problem. The current episode started 1 to 4 weeks ago. The problem has been gradually worsening since onset. There has been no fever. Associated symptoms include coughing, ear pain, sinus pressure and sneezing. Pertinent negatives include no chills. (Saliva feels "tacky". He is also having teeth pain. ) Past treatments include nothing.     Past Medical History:  Diagnosis Date   Diabetes mellitus type 2 in nonobese ([place]) 03/07/2020   Diabetes mellitus without complication ([place])    Exertional dyspnea 03/07/2020   Hypertension    Pure hypercholesterolemia 03/07/2020   Tobacco abuse 03/07/2020     Family History  Problem Relation Age of Onset   Diabetes Mother    Diabetes Father    High blood pressure Other    Hypertension Brother    Hypertension Sister    Diabetes Sister      Current Outpatient Medications:    amoxicillin-clavulanate (AUGMENTIN)  875-125 MG tablet, Take 1 tablet by mouth 2 (two) times daily., Disp: 14 tablet, Rfl: 0   Empagliflozin-metFORMIN HCl (SYNJARDY) 12.5-500 MG TABS, Take 1 tablet by mouth 2 (two) times daily., Disp: 180 tablet, Rfl: 1   Olopatadine HCl 0.2 % SOLN, Apply 2 drops to eye daily., Disp: 2.5 mL, Rfl: 2   albuterol (VENTOLIN HFA) 108 (90 Base) MCG/ACT inhaler, Inhale 2 puffs into the lungs every 4 to 6 hours for 15 days. (Patient not taking: Reported on 07/23/2021), Disp: 18 g, Rfl: 0   atorvastatin (LIPITOR) 40 MG tablet, Take 1 tablet (40 mg total) by mouth daily., Disp: 90 tablet, Rfl: 1   esomeprazole (NEXIUM) 40 MG capsule, TAKE 1 CAPSULE BY MOUTH DAILY, Disp: 90 capsule, Rfl: 1   fexofenadine (ALLEGRA) 180 MG tablet, Take 1 tablet (180 mg total) by mouth daily., Disp: 90 tablet, Rfl: 1   fluticasone (FLONASE) 50 MCG/ACT nasal spray, Place 2 sprays into both nostrils daily., Disp: 16 g, Rfl: 2   lisinopril-hydrochlorothiazide (ZESTORETIC) 20-12.5 MG tablet, Take 1 tablet by mouth daily., Disp: 90 tablet, Rfl: 1   methocarbamol (ROBAXIN) 500 MG tablet, Take 1 tablet (500 mg total) by mouth every 8 (eight) hours as needed for muscle spasms., Disp: 15 tablet, Rfl: 0   naproxen (NAPROSYN) 500 MG tablet, Take 1 tablet (500 mg total) by mouth 2 (two) times daily as needed for moderate pain., Disp: 15 tablet, Rfl: 0   OVER THE COUNTER MEDICATION, lipodine, Disp: ,  Rfl:    traMADol (ULTRAM) 50 MG tablet, Take 1 tablet (50 mg total) by mouth every 12 (twelve) hours as needed., Disp: 30 tablet, Rfl: 0   No Known Allergies   Review of Systems  Constitutional: Negative.  Negative for chills.  HENT:  Positive for ear pain, sinus pressure, sinus pain and sneezing.   Respiratory:  Positive for cough.   Cardiovascular: Negative.   Gastrointestinal: Negative.   Neurological: Negative.   Psychiatric/Behavioral: Negative.       Today's Vitals   12/15/21 1149  BP: 130/70  Pulse: 92  Temp: 98.7 F (37.1 C)   TempSrc: Oral  Weight: 178 lb (80.7 kg)  Height: '5\' 11"'$  (1.803 m)   Body mass index is 24.83 kg/m.   Objective:  Physical Exam Vitals reviewed.  Constitutional:      General: He is not in acute distress.    Appearance: Normal appearance.  HENT:     Head: Normocephalic.     Nose: Congestion present.  Eyes:     Pupils: Pupils are equal, round, and reactive to light.  Cardiovascular:     Rate and Rhythm: Normal rate and regular rhythm.     Pulses: Normal pulses.     Heart sounds: Normal heart sounds. No murmur heard. Pulmonary:     Effort: No respiratory distress.     Breath sounds: Normal breath sounds.  Skin:    General: Skin is warm and dry.     Capillary Refill: Capillary refill takes less than 2 seconds.  Neurological:     General: No focal deficit present.     Mental Status: He is alert and oriented to person, place, and time.  Psychiatric:        Mood and Affect: Mood normal.        Behavior: Behavior normal.        Thought Content: Thought content normal.        Judgment: Judgment normal.         Assessment And Plan:     1. Sinus pressure Comments: Will treat for sinus infection with Augmentin, continue with nasal spray and antihistamine - amoxicillin-clavulanate (AUGMENTIN) 875-125 MG tablet; Take 1 tablet by mouth 2 (two) times daily.  Dispense: 14 tablet; Refill: 0 - Novel Coronavirus, NAA ([facility]) - fluticasone (FLONASE) 50 MCG/ACT nasal spray; Place 2 sprays into both nostrils daily.  Dispense: 16 g; Refill: 2  2. Seasonal allergies - Olopatadine HCl 0.2 % SOLN; Apply 2 drops to eye daily.  Dispense: 2.5 mL; Refill: 2  3. Uncontrolled type 2 diabetes mellitus with hyperglycemia ([facility]) Comments: gave sample of synjardy 04/999 to take once a day until he picks up his Rx for 12.5/500 two times a day after speaking with Dr. [facility] - Empagliflozin-metFORMIN HCl (SYNJARDY) 12.5-500 MG TABS; Take 1 tablet by mouth 2 (two) times daily.  Dispense: 180 tablet;  Refill: 1     Patient was given opportunity to ask questions. Patient verbalized understanding of the plan and was able to repeat key elements of the plan. All questions were answered to their satisfaction.  Adam Brine, FNP    I, Adam Brine, FNP, have reviewed all documentation for this visit. The documentation on 12/15/21 for the exam, diagnosis, procedures, and orders are all accurate and complete.   IF YOU HAVE BEEN REFERRED TO A SPECIALIST, IT MAY TAKE 1-2 WEEKS TO SCHEDULE/PROCESS THE REFERRAL. IF YOU HAVE NOT HEARD FROM US/SPECIALIST IN TWO WEEKS, PLEASE GIVE [place] A CALL AT [phone]  X [phone].   THE PATIENT IS ENCOURAGED TO PRACTICE SOCIAL DISTANCING DUE TO THE COVID-19 PANDEMIC.

## 2021-12-15 NOTE — Patient Instructions (Signed)
Sinus Pain  Sinus pain may occur when your sinuses become clogged or swollen. Sinuses are air-filled spaces in your skull that are behind the bones of your face and forehead. Sinus pain can range from mild to severe. What are the causes? Sinus pain can result from various conditions that affect the sinuses. Common causes include: Colds. Sinus infections. Allergies. What are the signs or symptoms? The main symptom of this condition is pain or pressure in your face, forehead, ears, or upper teeth. People who have sinus pain often have other symptoms, such as: Congested or runny nose. Fever. Inability to smell. Headache. Weather changes can make symptoms worse. How is this diagnosed? Your health care provider will diagnose this condition based on your symptoms and a physical exam. If you have pain that keeps coming back or does not go away, your health care provider may recommend more testing. This may include: Imaging tests, such as a CT scan or MRI, to check for problems with your sinuses. Examination of your sinuses using a thin tool with a camera that is inserted through your nose (endoscopy). How is this treated? Treatment for this condition depends on the cause. Sinus pain that is caused by a sinus infection may be treated with antibiotic medicine. Sinus pain that is caused by congestion may be helped by rinsing out (flushing) the nose and sinuses with saline solution. Sinus pain that is caused by allergies may be helped by allergy medicines (antihistamines) and medicated nasal sprays. Sinus surgery may be needed in some cases if other treatments do not help. Follow these instructions at home: General instructions If directed: Apply a warm, moist washcloth to your face to help relieve pain. Use a nasal saline wash. Follow the directions on the bottle or box. Hydrate and humidify Drink enough water to keep your urine clear or pale yellow. Staying hydrated will help to thin your  mucus. Use a humidifier if your home is dry. Inhale steam for 10-15 minutes, 3-4 times a day or as told by your health care provider. You can do this in the bathroom while a hot shower is running. Limit your exposure to cool or dry air. Medicines  Take over-the-counter and prescription medicines only as told by your health care provider. If you were prescribed an antibiotic medicine, take it as told by your health care provider. Do not stop taking the antibiotic even if you start to feel better. If you have congestion, use a nasal spray to help lessen pressure. Contact a health care provider if: You have sinus pain more than one time a week. You have sensitivity to light or sound. You develop a fever. You feel nauseous or you vomit. Your sinus pain or headache does not get better with treatment. Get help right away if: You have vision problems. You have sudden, severe pain in your face or head. You have a seizure. You are confused. You have a stiff neck. Summary Sinus pain occurs when your sinuses become clogged or swollen. Sinus pain can result from various conditions that affect the sinuses, such as a cold, a sinus infection, or an allergy. Treatment for this condition depends on the cause. It may include medicine, such as antibiotics or antihistamines. This information is not intended to replace advice given to you by your health care provider. Make sure you discuss any questions you have with your health care provider. Document Revised: 06/01/2021 Document Reviewed: 06/01/2021 Elsevier Patient Education  2023 Elsevier Inc.  

## 2021-12-16 ENCOUNTER — Other Ambulatory Visit (HOSPITAL_COMMUNITY): Payer: Self-pay

## 2021-12-16 LAB — NOVEL CORONAVIRUS, NAA: SARS-CoV-2, NAA: NOT DETECTED

## 2021-12-17 ENCOUNTER — Ambulatory Visit: Payer: No Typology Code available for payment source | Admitting: Nurse Practitioner

## 2021-12-22 ENCOUNTER — Other Ambulatory Visit (HOSPITAL_COMMUNITY): Payer: Self-pay

## 2021-12-26 ENCOUNTER — Other Ambulatory Visit (HOSPITAL_COMMUNITY): Payer: Self-pay

## 2021-12-29 ENCOUNTER — Other Ambulatory Visit: Payer: Self-pay | Admitting: Nurse Practitioner

## 2021-12-29 ENCOUNTER — Other Ambulatory Visit (HOSPITAL_COMMUNITY): Payer: Self-pay

## 2021-12-29 MED ORDER — SYNJARDY XR 12.5-1000 MG PO TB24
2.0000 | ORAL_TABLET | Freq: Every day | ORAL | 2 refills | Status: DC
  Filled 2021-12-29: qty 60, fill #0

## 2021-12-29 MED ORDER — SYNJARDY XR 12.5-1000 MG PO TB24
1.0000 | ORAL_TABLET | Freq: Every day | ORAL | 2 refills | Status: DC
  Filled 2021-12-29 – 2022-02-09 (×2): qty 90, 90d supply, fill #0
  Filled 2022-05-14: qty 90, 90d supply, fill #1
  Filled 2022-08-20: qty 90, 90d supply, fill #2

## 2022-02-09 ENCOUNTER — Other Ambulatory Visit (HOSPITAL_COMMUNITY): Payer: Self-pay

## 2022-02-10 ENCOUNTER — Ambulatory Visit (INDEPENDENT_AMBULATORY_CARE_PROVIDER_SITE_OTHER): Payer: No Typology Code available for payment source | Admitting: Internal Medicine

## 2022-02-10 ENCOUNTER — Other Ambulatory Visit (HOSPITAL_COMMUNITY): Payer: Self-pay

## 2022-02-10 ENCOUNTER — Encounter: Payer: Self-pay | Admitting: Internal Medicine

## 2022-02-10 VITALS — BP 130/68 | HR 78 | Temp 98.3°F | Ht 71.0 in | Wt 171.8 lb

## 2022-02-10 DIAGNOSIS — I10 Essential (primary) hypertension: Secondary | ICD-10-CM

## 2022-02-10 DIAGNOSIS — J0100 Acute maxillary sinusitis, unspecified: Secondary | ICD-10-CM | POA: Diagnosis not present

## 2022-02-10 DIAGNOSIS — J3489 Other specified disorders of nose and nasal sinuses: Secondary | ICD-10-CM

## 2022-02-10 LAB — POC COVID19 BINAXNOW: SARS Coronavirus 2 Ag: NEGATIVE

## 2022-02-10 MED ORDER — CEFTRIAXONE SODIUM 500 MG IJ SOLR
500.0000 mg | Freq: Once | INTRAMUSCULAR | Status: AC
  Administered 2022-02-10: 500 mg via INTRAMUSCULAR

## 2022-02-10 MED ORDER — TRIAMCINOLONE ACETONIDE 40 MG/ML IJ SUSP
40.0000 mg | Freq: Once | INTRAMUSCULAR | Status: AC
  Administered 2022-02-10: 40 mg via INTRAMUSCULAR

## 2022-02-10 MED ORDER — HYDROCODONE BIT-HOMATROP MBR 5-1.5 MG/5ML PO SOLN
5.0000 mL | Freq: Four times a day (QID) | ORAL | 0 refills | Status: DC | PRN
  Filled 2022-02-10: qty 120, 6d supply, fill #0

## 2022-02-10 MED ORDER — AMOXICILLIN-POT CLAVULANATE 875-125 MG PO TABS
1.0000 | ORAL_TABLET | Freq: Two times a day (BID) | ORAL | 0 refills | Status: DC
  Filled 2022-02-10: qty 20, 10d supply, fill #0

## 2022-02-10 NOTE — Progress Notes (Signed)
Barnet Glasgow Martin,acting as a Education administrator for [name], MD.,have documented all relevant documentation on the behalf of [name], MD,as directed by  [name], MD while in the presence of [name], MD.    Subjective:     Patient ID: Adam Wiley , male    DOB: Nov 27, 1975 , 46 y.o.   MRN: [id number]   Chief Complaint  Patient presents with   Sinus Problem    HPI  Patient presents today for evaluation of possible sinus inflection. His sx started last Wednesday.  He c/o sinus congestion, ears stopped up, sore throat, cough,  and runny nose.  He has tried OTC meds (can't recall name of meds), without relief of his sx. 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   BP Readings from Last 3 Encounters: 02/10/22 : 130/68 12/15/21 : 130/70 11/20/21 : 130/80    Sinus Problem This is a new problem. The current episode started in the past 7 days. The problem is unchanged. There has been no fever. Associated symptoms include congestion, headaches, sinus pressure, sneezing and a sore throat.     Past Medical History:  Diagnosis Date   Diabetes mellitus type 2 in nonobese ([place]) 03/07/2020   Diabetes mellitus without complication ([place])    Exertional dyspnea 03/07/2020   Hypertension    Pure hypercholesterolemia 03/07/2020   Tobacco abuse 03/07/2020     Family History  Problem Relation Age of Onset   Diabetes Mother    Diabetes Father    High blood pressure Other    Hypertension Brother    Hypertension Sister    Diabetes Sister      Current Outpatient Medications:    albuterol (VENTOLIN HFA) 108 (90 Base) MCG/ACT inhaler, Inhale 2 puffs into the lungs every 4 to 6 hours for 15 days., Disp: 18 g, Rfl: 0   atorvastatin (LIPITOR) 40 MG tablet, Take 1 tablet (40 mg total) by mouth daily., Disp: 90 tablet, Rfl: 1   Empagliflozin-metFORMIN HCl ER (SYNJARDY XR) 12.11-998 MG TB24, Take 1 tablet by mouth  daily., Disp: 90 tablet, Rfl: 2   fexofenadine (ALLEGRA) 180 MG tablet, Take 1 tablet (180 mg total) by mouth daily., Disp: 90 tablet, Rfl: 1   fluticasone (FLONASE) 50 MCG/ACT nasal spray, Place 2 sprays into both nostrils daily., Disp: 16 g, Rfl: 2   HYDROcodone bit-homatropine (HYDROMET) 5-1.5 MG/5ML syrup, Take 5 mLs by mouth every 6 (six) hours as needed for cough., Disp: 120 mL, Rfl: 0   lisinopril-hydrochlorothiazide (ZESTORETIC) 20-12.5 MG tablet, Take 1 tablet by mouth daily., Disp: 90 tablet, Rfl: 1   methocarbamol (ROBAXIN) 500 MG tablet, Take 1 tablet (500 mg total) by mouth every 8 (eight) hours as needed for muscle spasms., Disp: 15 tablet, Rfl: 0   naproxen (NAPROSYN) 500 MG tablet, Take 1 tablet (500 mg total) by mouth 2 (two) times daily as needed for moderate pain., Disp: 15 tablet, Rfl: 0   Olopatadine HCl 0.2 % SOLN, Apply 2 drops to eye daily., Disp: 2.5 mL, Rfl: 2   OVER THE COUNTER MEDICATION, lipodine, Disp: , Rfl:    traMADol (ULTRAM) 50 MG tablet, Take 1 tablet (50 mg total) by mouth every 12 (twelve) hours as needed., Disp: 30 tablet, Rfl: 0   amoxicillin-clavulanate (AUGMENTIN) 875-125 MG tablet, Take 1 tablet by mouth 2 (two) times daily., Disp: 20 tablet, Rfl: 0   esomeprazole (  NEXIUM) 40 MG capsule, TAKE 1 CAPSULE BY MOUTH DAILY, Disp: 90 capsule, Rfl: 1   No Known Allergies   Review of Systems  Constitutional: Negative.   HENT:  Positive for congestion, sinus pressure, sinus pain, sneezing, sore throat and voice change.   Eyes: Negative.   Respiratory: Negative.    Cardiovascular: Negative.   Gastrointestinal: Negative.   Neurological:  Positive for headaches.     Today's Vitals   02/10/22 1420  BP: 130/68  Pulse: 78  Temp: 98.3 F (36.8 C)  TempSrc: Oral  Weight: 171 lb 12.8 oz (77.9 kg)  Height: '5\' 11"'$  (1.803 m)  PainSc: 9    Body mass index is 23.96 kg/m.  Wt Readings from Last 3 Encounters:  02/10/22 171 lb 12.8 oz (77.9 kg)  12/15/21 178 lb  (80.7 kg)  11/20/21 183 lb (83 kg)    Objective:  Physical Exam Vitals and nursing note reviewed.  Constitutional:      Appearance: Normal appearance. He is ill-appearing.  HENT:     Head: Normocephalic and atraumatic.     Comments: Maxillary sinuses tender to percussion    Right Ear: Tympanic membrane, ear canal and external ear normal. There is no impacted cerumen.     Left Ear: Tympanic membrane, ear canal and external ear normal. There is no impacted cerumen.     Mouth/Throat:     Pharynx: Posterior oropharyngeal erythema present.  Eyes:     Extraocular Movements: Extraocular movements intact.  Cardiovascular:     Rate and Rhythm: Normal rate and regular rhythm.     Heart sounds: Normal heart sounds.  Pulmonary:     Effort: Pulmonary effort is normal.     Breath sounds: Normal breath sounds.  Musculoskeletal:     Cervical back: Normal range of motion.  Skin:    General: Skin is warm.  Neurological:     General: No focal deficit present.     Mental Status: He is alert.  Psychiatric:        Mood and Affect: Mood normal.      Assessment And Plan:     1. Acute non-recurrent maxillary sinusitis Comments: Will treat for sinus infection with Augmentin, continue with nasal spray and antihistamine. He was also given Rocephin 500, Kenalog '40mg'$  IM x1. Rapid COVID test is negative. He agrees to PCR testing. He was also given a work note.  - cefTRIAXone (ROCEPHIN) injection 500 mg - triamcinolone acetonide (KENALOG-40) injection 40 mg - amoxicillin-clavulanate (AUGMENTIN) 875-125 MG tablet; Take 1 tablet by mouth 2 (two) times daily.  Dispense: 20 tablet; Refill: 0 - HYDROcodone bit-homatropine (HYDROMET) 5-1.5 MG/5ML syrup; Take 5 mLs by mouth every 6 (six) hours as needed for cough.  Dispense: 120 mL; Refill: 0 - POC COVID-19 - Novel Coronavirus, NAA ([facility])  2. Essential hypertension, benign Comments: Chronic, controlled. He is encouraged to avoid OTC decongestants due to  underlying HTN. May use Coricidin-HBP if needed.     Patient was given opportunity to ask questions. Patient verbalized understanding of the plan and was able to repeat key elements of the plan. All questions were answered to their satisfaction.   I, [name], MD, have reviewed all documentation for this visit. The documentation on 02/10/22 for the exam, diagnosis, procedures, and orders are all accurate and complete.   IF YOU HAVE BEEN REFERRED TO A SPECIALIST, IT MAY TAKE 1-2 WEEKS TO SCHEDULE/PROCESS THE REFERRAL. IF YOU HAVE NOT HEARD FROM US/SPECIALIST IN TWO WEEKS, PLEASE GIVE  [place] A CALL AT [phone] X [phone].   THE PATIENT IS ENCOURAGED TO PRACTICE SOCIAL DISTANCING DUE TO THE COVID-19 PANDEMIC.

## 2022-02-10 NOTE — Patient Instructions (Signed)

## 2022-02-11 LAB — NOVEL CORONAVIRUS, NAA: SARS-CoV-2, NAA: NOT DETECTED

## 2022-03-04 ENCOUNTER — Other Ambulatory Visit (HOSPITAL_COMMUNITY): Payer: Self-pay

## 2022-03-09 ENCOUNTER — Other Ambulatory Visit (HOSPITAL_COMMUNITY): Payer: Self-pay

## 2022-03-09 ENCOUNTER — Encounter: Payer: Self-pay | Admitting: Emergency Medicine

## 2022-03-09 ENCOUNTER — Ambulatory Visit
Admission: EM | Admit: 2022-03-09 | Discharge: 2022-03-09 | Disposition: A | Discharge status: Home / Self Care | Payer: No Typology Code available for payment source

## 2022-03-09 DIAGNOSIS — R052 Subacute cough: Secondary | ICD-10-CM | POA: Diagnosis not present

## 2022-03-09 DIAGNOSIS — J309 Allergic rhinitis, unspecified: Secondary | ICD-10-CM | POA: Diagnosis not present

## 2022-03-09 DIAGNOSIS — E119 Type 2 diabetes mellitus without complications: Secondary | ICD-10-CM

## 2022-03-09 DIAGNOSIS — H6991 Unspecified Eustachian tube disorder, right ear: Secondary | ICD-10-CM

## 2022-03-09 DIAGNOSIS — H6981 Other specified disorders of Eustachian tube, right ear: Secondary | ICD-10-CM

## 2022-03-09 DIAGNOSIS — R0981 Nasal congestion: Secondary | ICD-10-CM | POA: Diagnosis not present

## 2022-03-09 MED ORDER — PREDNISONE 20 MG PO TABS
40.0000 mg | ORAL_TABLET | Freq: Every day | ORAL | 0 refills | Status: DC
  Filled 2022-03-09: qty 10, 5d supply, fill #0

## 2022-03-09 MED ORDER — PROMETHAZINE-DM 6.25-15 MG/5ML PO SYRP
2.5000 mL | ORAL_SOLUTION | Freq: Three times a day (TID) | ORAL | 0 refills | Status: DC | PRN
  Filled 2022-03-09: qty 100, 14d supply, fill #0

## 2022-03-09 NOTE — ED Provider Notes (Signed)
[facility]   MRN: [id number] DOB: Jul 22, 1975  Subjective:   Adam Wiley is a 46 y.o. male presenting for 5-day history of acute onset persistent sinus headaches, bilateral ear fullness, sinus congestion, coughing.  At the beginning of this month, patient underwent a course of Augmentin for sinus infection.  Patient states he gets this once yearly around the same time each year.  He does take Allegra daily.  No current facility-administered medications for this encounter.  Current Outpatient Medications:    albuterol (VENTOLIN HFA) 108 (90 Base) MCG/ACT inhaler, Inhale 2 puffs into the lungs every 4 to 6 hours for 15 days., Disp: 18 g, Rfl: 0   amoxicillin-clavulanate (AUGMENTIN) 875-125 MG tablet, Take 1 tablet by mouth 2 (two) times daily., Disp: 20 tablet, Rfl: 0   Aspirin 81 MG CAPS, Aspirin 81 mg, Disp: , Rfl:    atorvastatin (LIPITOR) 40 MG tablet, Take 1 tablet (40 mg total) by mouth daily., Disp: 90 tablet, Rfl: 1   Empagliflozin-metFORMIN HCl ER (SYNJARDY XR) 12.11-998 MG TB24, Take 1 tablet by mouth daily., Disp: 90 tablet, Rfl: 2   esomeprazole (NEXIUM) 40 MG capsule, TAKE 1 CAPSULE BY MOUTH DAILY, Disp: 90 capsule, Rfl: 1   fexofenadine (ALLEGRA) 180 MG tablet, Take 1 tablet (180 mg total) by mouth daily., Disp: 90 tablet, Rfl: 1   fluticasone (FLONASE) 50 MCG/ACT nasal spray, Place 2 sprays into both nostrils daily., Disp: 16 g, Rfl: 2   HYDROcodone bit-homatropine (HYDROMET) 5-1.5 MG/5ML syrup, Take 5 mLs by mouth every 6 (six) hours as needed for cough., Disp: 120 mL, Rfl: 0   lisinopril-hydrochlorothiazide (ZESTORETIC) 20-12.5 MG tablet, Take 1 tablet by mouth daily., Disp: 90 tablet, Rfl: 1   methocarbamol (ROBAXIN) 500 MG tablet, Take 1 tablet (500 mg total) by mouth every 8 (eight) hours as needed for muscle spasms., Disp: 15 tablet, Rfl: 0   naproxen (NAPROSYN) 500 MG tablet, Take 1 tablet (500 mg total) by mouth 2 (two) times daily as needed for  moderate pain., Disp: 15 tablet, Rfl: 0   Olopatadine HCl 0.2 % SOLN, Apply 2 drops to eye daily., Disp: 2.5 mL, Rfl: 2   OVER THE COUNTER MEDICATION, lipodine, Disp: , Rfl:    traMADol (ULTRAM) 50 MG tablet, Take 1 tablet (50 mg total) by mouth every 12 (twelve) hours as needed., Disp: 30 tablet, Rfl: 0   No Known Allergies  Past Medical History:  Diagnosis Date   Diabetes mellitus type 2 in nonobese ([place]) 03/07/2020   Diabetes mellitus without complication ([place])    Exertional dyspnea 03/07/2020   Hypertension    Pure hypercholesterolemia 03/07/2020   Tobacco abuse 03/07/2020     History reviewed. No pertinent surgical history.  Family History  Problem Relation Age of Onset   Diabetes Mother    Diabetes Father    High blood pressure Other    Hypertension Brother    Hypertension Sister    Diabetes Sister     Social History   Tobacco Use   Smoking status: Former    Packs/day: 0.25    Years: 5.00    Total pack years: 1.25    Types: Cigarettes    Quit date: 2022    Years since quitting: 1.6   Smokeless tobacco: Never  Vaping Use   Vaping Use: Never used  Substance Use Topics   Alcohol use: Yes    Comment: occasionally   Drug use: No    ROS   Objective:  Vitals: BP (!) 147/87   Pulse 76   Temp 98.2 F (36.8 C)   Resp 20   SpO2 97%   Physical Exam Constitutional:      General: He is not in acute distress.    Appearance: Normal appearance. He is well-developed and normal weight. He is not ill-appearing, toxic-appearing or diaphoretic.  HENT:     Head: Normocephalic and atraumatic.     Right Ear: Tympanic membrane, ear canal and external ear normal. There is no impacted cerumen.     Left Ear: Tympanic membrane, ear canal and external ear normal. There is no impacted cerumen.     Nose: Nose normal. No congestion or rhinorrhea.     Mouth/Throat:     Mouth: Mucous membranes are moist.     Pharynx: No oropharyngeal exudate or posterior oropharyngeal erythema.   Eyes:     General: No scleral icterus.       Right eye: No discharge.        Left eye: No discharge.     Extraocular Movements: Extraocular movements intact.     Conjunctiva/sclera: Conjunctivae normal.  Cardiovascular:     Rate and Rhythm: Normal rate and regular rhythm.     Heart sounds: Normal heart sounds. No murmur heard.    No friction rub. No gallop.  Pulmonary:     Effort: Pulmonary effort is normal. No respiratory distress.     Breath sounds: Normal breath sounds. No stridor. No wheezing, rhonchi or rales.  Musculoskeletal:     Cervical back: Normal range of motion and neck supple. No rigidity. No muscular tenderness.  Neurological:     General: No focal deficit present.     Mental Status: He is alert and oriented to person, place, and time.     Cranial Nerves: No cranial nerve deficit.     Motor: No weakness.     Coordination: Coordination normal.     Gait: Gait normal.  Psychiatric:        Mood and Affect: Mood normal.        Behavior: Behavior normal.        Thought Content: Thought content normal.     Assessment and Plan :   PDMP not reviewed this encounter.  1. Allergic rhinitis, unspecified seasonality, unspecified trigger   2. Type 2 diabetes mellitus treated without insulin ([place])   3. Sinus congestion   4. Subacute cough     Given timeline of his symptoms, deferred COVID testing.  Low suspicion for sinusitis.  Patient already underwent a course of antibiotics this month.  Biotic stewardship.  Suspect primary problem is allergic rhinitis and there an oral prednisone course.  His diabetes is well controlled.  Recommended maintaining Allegra. Deferred imaging given clear cardiopulmonary exam, hemodynamically stable vital signs. Counseled patient on potential for adverse effects with medications prescribed/recommended today, ER and return-to-clinic precautions discussed, patient verbalized understanding.    Jaynee Eagles, [place] 03/10/22 [phone]

## 2022-03-09 NOTE — ED Triage Notes (Signed)
Pt here with 5 day hx of congestion, headache and ear fullness.

## 2022-03-09 NOTE — Discharge Instructions (Addendum)

## 2022-03-16 ENCOUNTER — Other Ambulatory Visit (HOSPITAL_COMMUNITY): Payer: Self-pay

## 2022-03-30 ENCOUNTER — Encounter: Payer: 59 | Admitting: Internal Medicine

## 2022-03-30 NOTE — Progress Notes (Deleted)
Rich Brave Llittleton,acting as a Education administrator for [name], MD.,have documented all relevant documentation on the behalf of [name], MD,as directed by  [name], MD while in the presence of [name], MD.   Subjective:     Patient ID: Adam Wiley , male    DOB: Feb 24, 1976 , 46 y.o.   MRN: [id number]   Chief Complaint  Patient presents with   Annual Exam    HPI  PT here for HM.      Past Medical History:  Diagnosis Date   Diabetes mellitus type 2 in nonobese ([place]) 03/07/2020   Diabetes mellitus without complication ([place])    Exertional dyspnea 03/07/2020   Hypertension    Pure hypercholesterolemia 03/07/2020   Tobacco abuse 03/07/2020     Family History  Problem Relation Age of Onset   Diabetes Mother    Diabetes Father    High blood pressure Other    Hypertension Brother    Hypertension Sister    Diabetes Sister      Current Outpatient Medications:    albuterol (VENTOLIN HFA) 108 (90 Base) MCG/ACT inhaler, Inhale 2 puffs into the lungs every 4 to 6 hours for 15 days., Disp: 18 g, Rfl: 0   amoxicillin-clavulanate (AUGMENTIN) 875-125 MG tablet, Take 1 tablet by mouth 2 (two) times daily., Disp: 20 tablet, Rfl: 0   Aspirin 81 MG CAPS, Aspirin 81 mg, Disp: , Rfl:    atorvastatin (LIPITOR) 40 MG tablet, Take 1 tablet (40 mg total) by mouth daily., Disp: 90 tablet, Rfl: 1   Empagliflozin-metFORMIN HCl ER (SYNJARDY XR) 12.11-998 MG TB24, Take 1 tablet by mouth daily., Disp: 90 tablet, Rfl: 2   esomeprazole (NEXIUM) 40 MG capsule, TAKE 1 CAPSULE BY MOUTH DAILY, Disp: 90 capsule, Rfl: 1   fexofenadine (ALLEGRA) 180 MG tablet, Take 1 tablet (180 mg total) by mouth daily., Disp: 90 tablet, Rfl: 1   fluticasone (FLONASE) 50 MCG/ACT nasal spray, Place 2 sprays into both nostrils daily., Disp: 16 g, Rfl: 2   HYDROcodone bit-homatropine (HYDROMET) 5-1.5 MG/5ML syrup, Take 5 mLs by mouth every 6 (six) hours as needed for cough., Disp: 120 mL, Rfl: 0    lisinopril-hydrochlorothiazide (ZESTORETIC) 20-12.5 MG tablet, Take 1 tablet by mouth daily., Disp: 90 tablet, Rfl: 1   methocarbamol (ROBAXIN) 500 MG tablet, Take 1 tablet (500 mg total) by mouth every 8 (eight) hours as needed for muscle spasms., Disp: 15 tablet, Rfl: 0   naproxen (NAPROSYN) 500 MG tablet, Take 1 tablet (500 mg total) by mouth 2 (two) times daily as needed for moderate pain., Disp: 15 tablet, Rfl: 0   Olopatadine HCl 0.2 % SOLN, Apply 2 drops to eye daily., Disp: 2.5 mL, Rfl: 2   OVER THE COUNTER MEDICATION, lipodine, Disp: , Rfl:    predniSONE (DELTASONE) 20 MG tablet, Take 2 tablets (40 mg total) by mouth daily with breakfast., Disp: 10 tablet, Rfl: 0   promethazine-dextromethorphan (PROMETHAZINE-DM) 6.25-15 MG/5ML syrup, Take 2.5 mLs by mouth 3 (three) times daily as needed for cough., Disp: 100 mL, Rfl: 0   traMADol (ULTRAM) 50 MG tablet, Take 1 tablet (50 mg total) by mouth every 12 (twelve) hours as needed., Disp: 30 tablet, Rfl: 0   No Known Allergies   Men's preventive visit. Patient Health Questionnaire (PHQ-2) is  [place] Office Visit from 03/20/2021 in [facility]  PHQ-2 Total Score 0     . Patient is on a *** diet. Marital  status: Married. Relevant history for alcohol use is:  Social History   Substance and Sexual Activity  Alcohol Use Yes   Comment: occasionally  . Relevant history for tobacco use is:  Social History   Tobacco Use  Smoking Status Former   Packs/day: 0.25   Years: 5.00   Total pack years: 1.25   Types: Cigarettes   Quit date: 2022   Years since quitting: 1.7  Smokeless Tobacco Never  .   Review of Systems  Constitutional: Negative.   HENT: Negative.    Eyes: Negative.   Respiratory: Negative.    Endocrine: Negative.   Genitourinary: Negative.      There were no vitals filed for this visit. There is no height or weight on file to calculate BMI.   Objective:  Physical Exam      Assessment And  Plan:    There are no diagnoses linked to this encounter.    Patient was given opportunity to ask questions. Patient verbalized understanding of the plan and was able to repeat key elements of the plan. All questions were answered to their satisfaction.   Sheppard Evens Ll[place], CMA   I, [place], CMA, have reviewed all documentation for this visit. The documentation on 03/30/22 for the exam, diagnosis, procedures, and orders are all accurate and complete.  THE PATIENT IS ENCOURAGED TO PRACTICE SOCIAL DISTANCING DUE TO THE COVID-19 PANDEMIC.

## 2022-04-05 NOTE — Progress Notes (Signed)
No show

## 2022-05-15 ENCOUNTER — Other Ambulatory Visit (HOSPITAL_COMMUNITY): Payer: Self-pay

## 2022-05-26 ENCOUNTER — Other Ambulatory Visit (HOSPITAL_COMMUNITY): Payer: Self-pay

## 2022-05-26 ENCOUNTER — Other Ambulatory Visit: Payer: Self-pay | Admitting: Internal Medicine

## 2022-06-02 ENCOUNTER — Other Ambulatory Visit (HOSPITAL_COMMUNITY): Payer: Self-pay

## 2022-06-02 ENCOUNTER — Other Ambulatory Visit: Payer: Self-pay

## 2022-06-02 MED ORDER — LISINOPRIL-HYDROCHLOROTHIAZIDE 20-12.5 MG PO TABS
1.0000 | ORAL_TABLET | Freq: Every day | ORAL | 1 refills | Status: DC
  Filled 2022-06-02: qty 90, 90d supply, fill #0
  Filled 2022-08-20: qty 90, 90d supply, fill #1

## 2022-06-02 MED ORDER — ATORVASTATIN CALCIUM 40 MG PO TABS
40.0000 mg | ORAL_TABLET | Freq: Every day | ORAL | 1 refills | Status: DC
  Filled 2022-06-02 – 2022-06-17 (×2): qty 90, 90d supply, fill #0
  Filled 2022-09-22: qty 90, 90d supply, fill #1

## 2022-06-05 ENCOUNTER — Other Ambulatory Visit (HOSPITAL_COMMUNITY): Payer: Self-pay

## 2022-06-17 ENCOUNTER — Other Ambulatory Visit (HOSPITAL_COMMUNITY): Payer: Self-pay

## 2022-07-02 ENCOUNTER — Encounter: Payer: Self-pay | Admitting: Internal Medicine

## 2022-07-02 ENCOUNTER — Ambulatory Visit (INDEPENDENT_AMBULATORY_CARE_PROVIDER_SITE_OTHER): Payer: No Typology Code available for payment source | Admitting: Internal Medicine

## 2022-07-02 VITALS — BP 140/90 | HR 88 | Temp 98.6°F | Ht 71.0 in | Wt 168.4 lb

## 2022-07-02 DIAGNOSIS — E1169 Type 2 diabetes mellitus with other specified complication: Secondary | ICD-10-CM | POA: Diagnosis not present

## 2022-07-02 DIAGNOSIS — Z6823 Body mass index (BMI) 23.0-23.9, adult: Secondary | ICD-10-CM | POA: Diagnosis not present

## 2022-07-02 DIAGNOSIS — E785 Hyperlipidemia, unspecified: Secondary | ICD-10-CM | POA: Diagnosis not present

## 2022-07-02 DIAGNOSIS — I10 Essential (primary) hypertension: Secondary | ICD-10-CM

## 2022-07-02 NOTE — Patient Instructions (Signed)

## 2022-07-02 NOTE — Progress Notes (Signed)
Adam Wiley,acting as a Education administrator for Adam Greenland, MD.,have documented all relevant documentation on the behalf of Adam Greenland, MD,as directed by  Adam Greenland, MD while in the presence of Adam Greenland, MD.    Subjective:     Patient ID: Adam Wiley , male    DOB: 08-13-1975 , 46 y.o.   MRN: [id number]   Chief Complaint  Patient presents with   Diabetes   Hypertension    HPI  The patient is here today for a blood pressure/DM  f/u. He reports compliance with meds. He denies headaches, palpitations and shortness of breath.    Diabetes He presents for his follow-up diabetic visit. He has type 2 diabetes mellitus. His disease course has been improving. There are no hypoglycemic associated symptoms. Pertinent negatives for diabetes include no blurred vision, no polydipsia, no polyphagia and no polyuria. There are no hypoglycemic complications. Risk factors for coronary artery disease include diabetes mellitus, hypertension and male sex. He is compliant with treatment most of the time. He is following a diabetic diet. He participates in exercise three times a week. His home blood glucose trend is fluctuating minimally. His breakfast blood glucose is taken between 7-8 am. His breakfast blood glucose range is generally 110-130 mg/dl. An ACE inhibitor/angiotensin II receptor blocker is being taken.  Hypertension This is a chronic problem. The current episode started more than 1 year ago. The problem has been gradually improving since onset. The problem is controlled. Pertinent negatives include no blurred vision. Past treatments include ACE inhibitors and diuretics. The current treatment provides moderate improvement.     Past Medical History:  Diagnosis Date   Diabetes mellitus type 2 in nonobese ([facility]) 03/07/2020   Diabetes mellitus without complication ([facility])    Exertional dyspnea 03/07/2020   Hypertension    Pure hypercholesterolemia 03/07/2020   Tobacco abuse 03/07/2020      Family History  Problem Relation Age of Onset   Diabetes Mother    Diabetes Father    High blood pressure Other    Hypertension Brother    Hypertension Sister    Diabetes Sister      Current Outpatient Medications:    albuterol (VENTOLIN HFA) 108 (90 Base) MCG/ACT inhaler, Inhale 2 puffs into the lungs every 4 to 6 hours for 15 days., Disp: 18 g, Rfl: 0   Aspirin 81 MG CAPS, Aspirin 81 mg, Disp: , Rfl:    atorvastatin (LIPITOR) 40 MG tablet, Take 1 tablet (40 mg total) by mouth daily., Disp: 90 tablet, Rfl: 1   Empagliflozin-metFORMIN HCl ER (SYNJARDY XR) 12.11-998 MG TB24, Take 1 tablet by mouth daily., Disp: 90 tablet, Rfl: 2   fexofenadine (ALLEGRA) 180 MG tablet, Take 1 tablet (180 mg total) by mouth daily., Disp: 90 tablet, Rfl: 1   fluticasone (FLONASE) 50 MCG/ACT nasal spray, Place 2 sprays into both nostrils daily., Disp: 16 g, Rfl: 2   lisinopril-hydrochlorothiazide (ZESTORETIC) 20-12.5 MG tablet, Take 1 tablet by mouth daily., Disp: 90 tablet, Rfl: 1   methocarbamol (ROBAXIN) 500 MG tablet, Take 1 tablet (500 mg total) by mouth every 8 (eight) hours as needed for muscle spasms., Disp: 15 tablet, Rfl: 0   naproxen (NAPROSYN) 500 MG tablet, Take 1 tablet (500 mg total) by mouth 2 (two) times daily as needed for moderate pain., Disp: 15 tablet, Rfl: 0   Olopatadine HCl 0.2 % SOLN, Apply 2 drops to eye daily., Disp: 2.5 mL, Rfl: 2   OVER THE COUNTER  MEDICATION, lipodine, Disp: , Rfl:    traMADol (ULTRAM) 50 MG tablet, Take 1 tablet (50 mg total) by mouth every 12 (twelve) hours as needed., Disp: 30 tablet, Rfl: 0   esomeprazole (NEXIUM) 40 MG capsule, TAKE 1 CAPSULE BY MOUTH DAILY, Disp: 90 capsule, Rfl: 1   No Known Allergies   Review of Systems  Constitutional: Negative.   Eyes: Negative.  Negative for blurred vision.  Respiratory: Negative.    Cardiovascular: Negative.   Gastrointestinal: Negative.   Endocrine: Negative for polydipsia, polyphagia and polyuria.   Musculoskeletal: Negative.   Skin: Negative.   Neurological: Negative.   Psychiatric/Behavioral: Negative.       Today's Vitals   07/02/22 1415 07/02/22 1451  BP: (!) 150/100 (!) 140/90  Pulse: 88   Temp: 98.6 F (37 C)   Weight: 168 lb 6.4 oz (76.4 kg)   Height: _0  (1.803 m)   PainSc: 0-No pain    Body mass index is 23.49 kg/m.  Wt Readings from Last 3 Encounters:  07/02/22 168 lb 6.4 oz (76.4 kg)  02/10/22 171 lb 12.8 oz (77.9 kg)  12/15/21 178 lb (80.7 kg)    BP Readings from Last 3 Encounters:  07/02/22 (!) 140/90  03/09/22 (!) 147/87  02/10/22 130/68    Objective:  Physical Exam Vitals and nursing note reviewed.  Constitutional:      Appearance: Normal appearance.  HENT:     Nose:     Comments: Masked     Mouth/Throat:     Comments: Masked  Eyes:     Extraocular Movements: Extraocular movements intact.  Cardiovascular:     Rate and Rhythm: Normal rate and regular rhythm.     Heart sounds: Normal heart sounds.  Pulmonary:     Effort: Pulmonary effort is normal.     Breath sounds: Normal breath sounds.  Musculoskeletal:     Cervical back: Normal range of motion.  Skin:    General: Skin is warm.  Neurological:     General: No focal deficit present.     Mental Status: He is alert.  Psychiatric:        Mood and Affect: Mood normal.       Assessment And Plan:     1. Dyslipidemia associated with type 2 diabetes mellitus ([facility]) Comments: Chronic, I will check labs as below. If a1c not at goal, will consider increasing Synjardy 12.5/1000mg to BID dosing. F/u in 3 months, sooner if meds changed. - CMP14+EGFR - CBC - Lipid panel - Hemoglobin A1c  2. Essential hypertension, benign Comments: Chronic, uncontrolled. Currently on lisinopril 20/12.29m daily. If BP elevated at next visit, I will add amlodipine nightly.  Importance of dietary/medication compliance was d/w patient.  - CMP14+EGFR  3. BMI 23.0-23.9, adult Comments: He is encouraged to aim  for at least 150 minutes of exercise per week.   Patient was given opportunity to ask questions. Patient verbalized understanding of the plan and was able to repeat key elements of the plan. All questions were answered to their satisfaction.   I, RMaximino Greenland MD, have reviewed all documentation for this visit. The documentation on 07/02/22 for the exam, diagnosis, procedures, and orders are all accurate and complete.   IF YOU HAVE BEEN REFERRED TO A SPECIALIST, IT MAY TAKE 1-2 WEEKS TO SCHEDULE/PROCESS THE REFERRAL. IF YOU HAVE NOT HEARD FROM US/SPECIALIST IN TWO WEEKS, PLEASE GIVE [place] CALL AT [phone].   THE PATIENT IS ENCOURAGED TO PRACTICE SOCIAL DISTANCING  DUE TO THE COVID-19 PANDEMIC.

## 2022-07-03 LAB — CMP14+EGFR
ALT: 27 IU/L (ref 0–44)
AST: 20 IU/L (ref 0–40)
Albumin/Globulin Ratio: 2.5 — ABNORMAL HIGH (ref 1.2–2.2)
Albumin: 5 g/dL (ref 4.1–5.1)
Alkaline Phosphatase: 92 IU/L (ref 44–121)
BUN/Creatinine Ratio: 19 (ref 9–20)
BUN: 20 mg/dL (ref 6–24)
Bilirubin Total: 0.3 mg/dL (ref 0.0–1.2)
CO2: 28 mmol/L (ref 20–29)
Calcium: 10.7 mg/dL — ABNORMAL HIGH (ref 8.7–10.2)
Chloride: 103 mmol/L (ref 96–106)
Creatinine, Ser: 1.08 mg/dL (ref 0.76–1.27)
Globulin, Total: 2 g/dL (ref 1.5–4.5)
Glucose: 88 mg/dL (ref 70–99)
Potassium: 4.6 mmol/L (ref 3.5–5.2)
Sodium: 143 mmol/L (ref 134–144)
Total Protein: 7 g/dL (ref 6.0–8.5)
eGFR: 85 mL/min/{1.73_m2} (ref >59)

## 2022-07-03 LAB — CBC
Hematocrit: 42.7 % (ref 37.5–51.0)
Hemoglobin: 14.5 g/dL (ref 13.0–17.7)
MCH: 30 pg (ref 26.6–33.0)
MCHC: 34 g/dL (ref 31.5–35.7)
MCV: 88 fL (ref 79–97)
Platelets: 341 10*3/uL (ref 150–450)
RBC: 4.84 x10E6/uL (ref 4.14–5.80)
RDW: 12.8 % (ref 11.6–15.4)
WBC: 7.6 10*3/uL (ref 3.4–10.8)

## 2022-07-03 LAB — LIPID PANEL
Chol/HDL Ratio: 3.4 ratio (ref 0.0–5.0)
Cholesterol, Total: 158 mg/dL (ref 100–199)
HDL: 46 mg/dL (ref >39)
LDL Chol Calc (NIH): 78 mg/dL (ref 0–99)
Triglycerides: 206 mg/dL — ABNORMAL HIGH (ref 0–149)
VLDL Cholesterol Cal: 34 mg/dL (ref 5–40)

## 2022-07-03 LAB — HEMOGLOBIN A1C
Est. average glucose Bld gHb Est-mCnc: 166 mg/dL
Hgb A1c MFr Bld: 7.4 % — ABNORMAL HIGH (ref 4.8–5.6)

## 2022-07-28 ENCOUNTER — Ambulatory Visit: Payer: 59

## 2022-08-20 ENCOUNTER — Other Ambulatory Visit: Payer: Self-pay

## 2022-08-20 ENCOUNTER — Other Ambulatory Visit (HOSPITAL_COMMUNITY): Payer: Self-pay

## 2022-10-05 ENCOUNTER — Encounter: Payer: Self-pay | Admitting: Pharmacist

## 2022-10-12 ENCOUNTER — Encounter: Payer: Self-pay | Admitting: Internal Medicine

## 2022-10-29 ENCOUNTER — Telehealth: Payer: Self-pay | Admitting: Internal Medicine

## 2022-10-29 NOTE — Telephone Encounter (Signed)
Ok with switch  

## 2022-10-29 NOTE — Telephone Encounter (Signed)
Pt is requesting to switch PCP's from Dr. Allyne Gee to Dr. Alvy Bimler. Please let me know if this is acceptable.

## 2022-11-17 ENCOUNTER — Other Ambulatory Visit (HOSPITAL_COMMUNITY): Payer: Self-pay

## 2022-12-02 ENCOUNTER — Other Ambulatory Visit: Payer: Self-pay | Admitting: Nurse Practitioner

## 2022-12-02 ENCOUNTER — Other Ambulatory Visit: Payer: Self-pay | Admitting: Internal Medicine

## 2022-12-03 ENCOUNTER — Other Ambulatory Visit (HOSPITAL_COMMUNITY): Payer: Self-pay

## 2022-12-03 MED ORDER — LISINOPRIL-HYDROCHLOROTHIAZIDE 20-12.5 MG PO TABS
1.0000 | ORAL_TABLET | Freq: Every day | ORAL | 1 refills | Status: DC
  Filled 2022-12-03: qty 90, 90d supply, fill #0
  Filled 2023-03-04: qty 90, 90d supply, fill #1

## 2022-12-03 MED ORDER — SYNJARDY XR 12.5-1000 MG PO TB24
1.0000 | ORAL_TABLET | Freq: Every day | ORAL | 2 refills | Status: DC
  Filled 2022-12-03: qty 90, 90d supply, fill #0
  Filled 2023-03-11: qty 90, 90d supply, fill #1
  Filled 2023-06-15: qty 90, 90d supply, fill #2

## 2022-12-04 ENCOUNTER — Other Ambulatory Visit (HOSPITAL_COMMUNITY): Payer: Self-pay

## 2022-12-14 ENCOUNTER — Other Ambulatory Visit (HOSPITAL_COMMUNITY): Payer: Self-pay

## 2022-12-14 ENCOUNTER — Other Ambulatory Visit: Payer: Self-pay | Admitting: Internal Medicine

## 2022-12-14 MED ORDER — ESOMEPRAZOLE MAGNESIUM 40 MG PO CPDR
DELAYED_RELEASE_CAPSULE | Freq: Every day | ORAL | 1 refills | Status: AC
  Filled 2022-12-14: qty 90, 90d supply, fill #0

## 2022-12-15 ENCOUNTER — Other Ambulatory Visit (HOSPITAL_COMMUNITY): Payer: Self-pay

## 2023-01-04 ENCOUNTER — Other Ambulatory Visit (HOSPITAL_COMMUNITY): Payer: Self-pay

## 2023-01-04 ENCOUNTER — Other Ambulatory Visit: Payer: Self-pay | Admitting: Internal Medicine

## 2023-01-07 ENCOUNTER — Other Ambulatory Visit (HOSPITAL_COMMUNITY): Payer: Self-pay

## 2023-01-14 ENCOUNTER — Other Ambulatory Visit: Payer: Self-pay | Admitting: Internal Medicine

## 2023-01-14 ENCOUNTER — Encounter: Payer: Self-pay | Admitting: Emergency Medicine

## 2023-01-18 ENCOUNTER — Other Ambulatory Visit (HOSPITAL_COMMUNITY): Payer: Self-pay

## 2023-02-10 ENCOUNTER — Encounter: Payer: Self-pay | Admitting: Emergency Medicine

## 2023-02-10 ENCOUNTER — Ambulatory Visit (INDEPENDENT_AMBULATORY_CARE_PROVIDER_SITE_OTHER): Payer: 59 | Admitting: Emergency Medicine

## 2023-02-10 ENCOUNTER — Other Ambulatory Visit (HOSPITAL_COMMUNITY): Payer: Self-pay

## 2023-02-10 VITALS — BP 126/84 | HR 69 | Temp 98.1°F | Ht 71.0 in | Wt 170.4 lb

## 2023-02-10 DIAGNOSIS — Z7984 Long term (current) use of oral hypoglycemic drugs: Secondary | ICD-10-CM | POA: Diagnosis not present

## 2023-02-10 DIAGNOSIS — E1169 Type 2 diabetes mellitus with other specified complication: Secondary | ICD-10-CM | POA: Insufficient documentation

## 2023-02-10 DIAGNOSIS — Z720 Tobacco use: Secondary | ICD-10-CM | POA: Diagnosis not present

## 2023-02-10 DIAGNOSIS — S29012A Strain of muscle and tendon of back wall of thorax, initial encounter: Secondary | ICD-10-CM | POA: Diagnosis not present

## 2023-02-10 DIAGNOSIS — E785 Hyperlipidemia, unspecified: Secondary | ICD-10-CM

## 2023-02-10 DIAGNOSIS — Z113 Encounter for screening for infections with a predominantly sexual mode of transmission: Secondary | ICD-10-CM

## 2023-02-10 DIAGNOSIS — Z7689 Persons encountering health services in other specified circumstances: Secondary | ICD-10-CM

## 2023-02-10 DIAGNOSIS — I152 Hypertension secondary to endocrine disorders: Secondary | ICD-10-CM | POA: Diagnosis not present

## 2023-02-10 DIAGNOSIS — E1159 Type 2 diabetes mellitus with other circulatory complications: Secondary | ICD-10-CM

## 2023-02-10 MED ORDER — CYCLOBENZAPRINE HCL 10 MG PO TABS
10.0000 mg | ORAL_TABLET | Freq: Every day | ORAL | 0 refills | Status: DC
  Filled 2023-02-10: qty 30, 30d supply, fill #0

## 2023-02-10 NOTE — Progress Notes (Signed)
Adam Wiley 47 y.o.   Chief Complaint  Patient presents with   transfer of care    Lower back pain, patient thinks he hurt / aggravated it at work   Patient wants labs for STD testing     HISTORY OF PRESENT ILLNESS: This is a 47 y.o. male A1A first visit to this office, here to establish care with me. 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  Complaining of mid low back pain Also requesting screening STD.  Asymptomatic. No other complaints or medical concerns today.  HPI   Prior to Admission medications   Medication Sig Start Date End Date Taking? Authorizing Provider  Empagliflozin-metFORMIN HCl ER (SYNJARDY XR) 12.11-998 MG TB24 Take 1 tablet by mouth daily. 12/03/22  Yes Arnette Felts, FNP  esomeprazole (NEXIUM) 40 MG capsule TAKE 1 CAPSULE BY MOUTH DAILY 12/14/22 12/14/23 Yes Dorothyann Peng, MD  fexofenadine (ALLEGRA) 180 MG tablet Take 1 tablet (180 mg total) by mouth daily. 08/15/21  Yes Arnette Felts, FNP  fluticasone (FLONASE) 50 MCG/ACT nasal spray Place 2 sprays into both nostrils daily. 12/15/21  Yes Arnette Felts, FNP  lisinopril-hydrochlorothiazide (ZESTORETIC) 20-12.5 MG tablet Take 1 tablet by mouth daily. 12/03/22  Yes Dorothyann Peng, MD  Aspirin 81 MG CAPS Aspirin 81 mg Patient not taking: Reported on 02/10/2023    [provider]  atorvastatin (LIPITOR) 40 MG tablet Take 1 tablet (40 mg total) by mouth daily. Patient not taking: Reported on 02/10/2023 06/02/22   Dorothyann Peng, MD  methocarbamol (ROBAXIN) 500 MG tablet Take 1 tablet (500 mg total) by mouth every 8 (eight) hours as needed for muscle spasms. Patient not taking: Reported on 02/10/2023 11/01/21   Petrucelli, Lelon Mast R, PA-C  naproxen (NAPROSYN) 500 MG tablet Take 1 tablet (500 mg total) by mouth 2 (two) times daily as needed for moderate pain. Patient not taking: Reported on 02/10/2023 11/01/21   Petrucelli, Pleas Koch, PA-C   traMADol (ULTRAM) 50 MG tablet Take 1 tablet (50 mg total) by mouth every 12 (twelve) hours as needed. Patient not taking: Reported on 02/10/2023 10/03/21   Dorothyann Peng, MD    No Known Allergies  Patient Active Problem List   Diagnosis Date Noted   Exertional dyspnea 03/07/2020   Pure hypercholesterolemia 03/07/2020   Tobacco abuse 03/07/2020   Uncontrolled type 2 diabetes mellitus with hyperglycemia ([facility]) 06/28/2018   Essential hypertension, benign 06/28/2018    Past Medical History:  Diagnosis Date   Diabetes mellitus type 2 in nonobese ([facility]) 03/07/2020   Diabetes mellitus without complication ([facility])    Exertional dyspnea 03/07/2020   Hypertension    Pure hypercholesterolemia 03/07/2020   Tobacco abuse 03/07/2020    No past surgical history on file.  Social History   Socioeconomic History   Marital status: Married    Spouse name: Not on file   Number of children: Not on file   Years of education: Not on file   Highest education level: Not on file  Occupational History   Not on file  Tobacco Use   Smoking status: Former    Current packs/day: 0.00    Average packs/day: 0.3 packs/day for 5.0 years (1.3 ttl pk-yrs)    Types: Cigarettes    Start date: 2017    Quit date: 2022    Years since quitting: 2.5   Smokeless tobacco: Never  Vaping Use   Vaping status: Never Used  Substance and Sexual Activity   Alcohol  use: Yes    Comment: occasionally   Drug use: No   Sexual activity: Yes    Birth control/protection: Condom, None  Other Topics Concern   Not on file  Social History Narrative   Not on file   Social Determinants of Health   Financial Resource Strain: Not on file  Food Insecurity: Not on file  Transportation Needs: Not on file  Physical Activity: Not on file  Stress: Not on file  Social Connections: Unknown (11/21/2021)   Received from [facility]   Social Network    Social Network: Not on file  Intimate Partner Violence: Unknown (10/13/2021)    Received from [facility]   HITS    Physically Hurt: Not on file    Insult or Talk Down To: Not on file    Threaten Physical Harm: Not on file    Scream or Curse: Not on file    Family History  Problem Relation Age of Onset   Diabetes Mother    Diabetes Father    High blood pressure Other    Hypertension Brother    Hypertension Sister    Diabetes Sister      Review of Systems  Constitutional: Negative.  Negative for chills and fever.  HENT: Negative.  Negative for congestion and sore throat.   Respiratory: Negative.  Negative for cough and shortness of breath.   Cardiovascular: Negative.  Negative for chest pain and palpitations.  Gastrointestinal:  Negative for abdominal pain, diarrhea, nausea and vomiting.  Genitourinary: Negative.  Negative for dysuria and hematuria.  Skin: Negative.  Negative for rash.  Neurological: Negative.  Negative for dizziness and headaches.  All other systems reviewed and are negative.   Vitals:   02/10/23 0816  BP: 126/84  Pulse: 69  Temp: 98.1 F (36.7 C)  SpO2: 96%    Physical Exam Vitals reviewed.  Constitutional:      Appearance: Normal appearance.  HENT:     Head: Normocephalic.     Mouth/Throat:     Mouth: Mucous membranes are moist.     Pharynx: Oropharynx is clear.  Eyes:     Extraocular Movements: Extraocular movements intact.     Conjunctiva/sclera: Conjunctivae normal.     Pupils: Pupils are equal, round, and reactive to light.  Cardiovascular:     Rate and Rhythm: Normal rate and regular rhythm.     Pulses: Normal pulses.     Heart sounds: Normal heart sounds.  Pulmonary:     Effort: Pulmonary effort is normal.     Breath sounds: Normal breath sounds.  Abdominal:     Palpations: Abdomen is soft.     Tenderness: There is no abdominal tenderness.  Musculoskeletal:     Cervical back: No tenderness.     Comments: Mild tenderness with muscle spasm left mid upper back  Lymphadenopathy:     Cervical: No cervical  adenopathy.  Skin:    General: Skin is warm and dry.     Capillary Refill: Capillary refill takes less than 2 seconds.  Neurological:     General: No focal deficit present.     Mental Status: He is alert and oriented to person, place, and time.  Psychiatric:        Mood and Affect: Mood normal.        Behavior: Behavior normal.      ASSESSMENT & PLAN: A total of 47 minutes was spent with the patient and counseling/coordination of care regarding preparing for this visit, review of  available medical records, establishing care with me, review of multiple chronic medical conditions under management, review of all medications, education on nutrition, cardiovascular risks associated with hypertension and diabetes, prognosis, documentation and need for follow-up.  Problem List Items Addressed This Visit       Cardiovascular and Mediastinum   Hypertension associated with diabetes (HCC) - Primary    BP Readings from Last 3 Encounters:  02/10/23 126/84  07/02/22 (!) 140/90  03/09/22 (!) 147/87  Well-controlled hypertension Continue Zestoretic 20-12.5 mg daily Well-controlled diabetes.  Hemoglobin A1c done today. Continue Synjardy 12.11-998 mg 1 a day Diet and nutrition discussed Cardiovascular risk associated with hypertension and diabetes discussed       Relevant Orders   CBC with Differential/Platelet   Comprehensive metabolic panel   Lipid panel   Hemoglobin A1c   Microalbumin / creatinine urine ratio   Urinalysis     Endocrine   Dyslipidemia associated with type 2 diabetes mellitus (HCC)    Chronic stable condition Continue atorvastatin 40 mg daily Not taking it at present time Lipid file done today Diet and nutrition discussed The 10-year ASCVD risk score (Arnett DK, et al., 2019) is: 14.3%   Values used to calculate the score:     Age: 36 years     Sex: Male     Is Non-Hispanic African American: Yes     Diabetic: Yes     Tobacco smoker: No     Systolic Blood  Pressure: 126 mmHg     Is BP treated: Yes     HDL Cholesterol: 46 mg/dL     Total Cholesterol: 158 mg/dL       Relevant Orders   CBC with Differential/Platelet   Comprehensive metabolic panel   Lipid panel   Hemoglobin A1c     Other   Tobacco abuse    Still smoking but less than before Cardiovascular and cancer risk associated with smoking discussed Smoking cessation advice given.      Other Visit Diagnoses     Encounter to establish care       Screening for STD (sexually transmitted disease)       Relevant Orders   GC/Chlamydia Probe Amp   Hepatitis C antibody   HIV Antibody (routine testing w rflx)   RPR      Patient Instructions  Health Maintenance, Male Adopting a healthy lifestyle and getting preventive care are important in promoting health and wellness. Ask your health care provider about: The right schedule for you to have regular tests and exams. Things you can do on your own to prevent diseases and keep yourself healthy. What should I know about diet, weight, and exercise? Eat a healthy diet  Eat a diet that includes plenty of vegetables, fruits, low-fat dairy products, and lean protein. Do not eat a lot of foods that are high in solid fats, added sugars, or sodium. Maintain a healthy weight Body mass index (BMI) is a measurement that can be used to identify possible weight problems. It estimates body fat based on height and weight. Your health care provider can help determine your BMI and help you achieve or maintain a healthy weight. Get regular exercise Get regular exercise. This is one of the most important things you can do for your health. Most adults should: Exercise for at least 150 minutes each week. The exercise should increase your heart rate and make you sweat (moderate-intensity exercise). Do strengthening exercises at least twice a week. This is in addition to the  moderate-intensity exercise. Spend less time sitting. Even light physical  activity can be beneficial. Watch cholesterol and blood lipids Have your blood tested for lipids and cholesterol at 49 years of age, then have this test every 5 years. You may need to have your cholesterol levels checked more often if: Your lipid or cholesterol levels are high. You are older than 49 years of age. You are at high risk for heart disease. What should I know about cancer screening? Many types of cancers can be detected early and may often be prevented. Depending on your health history and family history, you may need to have cancer screening at various ages. This may include screening for: Colorectal cancer. Prostate cancer. Skin cancer. Lung cancer. What should I know about heart disease, diabetes, and high blood pressure? Blood pressure and heart disease High blood pressure causes heart disease and increases the risk of stroke. This is more likely to develop in people who have high blood pressure readings or are overweight. Talk with your health care provider about your target blood pressure readings. Have your blood pressure checked: Every 3-5 years if you are 1-40 years of age. Every year if you are 79 years old or older. If you are between the ages of 15 and 75 and are a current or former smoker, ask your health care provider if you should have a one-time screening for abdominal aortic aneurysm (AAA). Diabetes Have regular diabetes screenings. This checks your fasting blood sugar level. Have the screening done: Once every three years after age 26 if you are at a normal weight and have a low risk for diabetes. More often and at a younger age if you are overweight or have a high risk for diabetes. What should I know about preventing infection? Hepatitis B If you have a higher risk for hepatitis B, you should be screened for this virus. Talk with your health care provider to find out if you are at risk for hepatitis B infection. Hepatitis C Blood testing is recommended  for: Everyone born from [birth year] through 1967. Anyone with known risk factors for hepatitis C. Sexually transmitted infections (STIs) You should be screened each year for STIs, including gonorrhea and chlamydia, if: You are sexually active and are younger than 49 years of age. You are older than 49 years of age and your health care provider tells you that you are at risk for this type of infection. Your sexual activity has changed since you were last screened, and you are at increased risk for chlamydia or gonorrhea. Ask your health care provider if you are at risk. Ask your health care provider about whether you are at high risk for HIV. Your health care provider may recommend a prescription medicine to help prevent HIV infection. If you choose to take medicine to prevent HIV, you should first get tested for HIV. You should then be tested every 3 months for as long as you are taking the medicine. Follow these instructions at home: Alcohol use Do not drink alcohol if your health care provider tells you not to drink. If you drink alcohol: Limit how much you have to 0-2 drinks a day. Know how much alcohol is in your drink. In the [place] one drink equals one 12 oz bottle of beer (355 mL), one 5 oz glass of wine (148 mL), or one 1 oz glass of hard liquor (44 mL). Lifestyle Do not use any products that contain nicotine or tobacco. These products include cigarettes, chewing tobacco, and  vaping devices, such as e-cigarettes. If you need help quitting, ask your health care provider. Do not use street drugs. Do not share needles. Ask your health care provider for help if you need support or information about quitting drugs. General instructions Schedule regular health, dental, and eye exams. Stay current with your vaccines. Tell your health care provider if: You often feel depressed. You have ever been abused or do not feel safe at home. Summary Adopting a healthy lifestyle and getting preventive care  are important in promoting health and wellness. Follow your health care provider's instructions about healthy diet, exercising, and getting tested or screened for diseases. Follow your health care provider's instructions on monitoring your cholesterol and blood pressure. This information is not intended to replace advice given to you by your health care provider. Make sure you discuss any questions you have with your health care provider. Document Revised: 11/18/2020 Document Reviewed: 11/18/2020 Elsevier Patient Education  2024 Elsevier Inc.      Edwina Barth, MD [facility]

## 2023-02-10 NOTE — Assessment & Plan Note (Signed)
BP Readings from Last 3 Encounters:  02/10/23 126/84  07/02/22 (!) 140/90  03/09/22 (!) 147/87  Well-controlled hypertension Continue Zestoretic 20-12.5 mg daily Well-controlled diabetes.  Hemoglobin A1c done today. Continue Synjardy 12.11-998 mg 1 a day Diet and nutrition discussed Cardiovascular risk associated with hypertension and diabetes discussed

## 2023-02-10 NOTE — Assessment & Plan Note (Addendum)
Related to activities of daily living.   Pain management discussed May use Tylenol and or Advil during the day Advised to use heat pad during the day May benefit from Flexeril 10 mg at bedtime

## 2023-02-10 NOTE — Assessment & Plan Note (Signed)
Still smoking but less than before Cardiovascular and cancer risk associated with smoking discussed Smoking cessation advice given.

## 2023-02-10 NOTE — Patient Instructions (Signed)
Health Maintenance, Male Adopting a healthy lifestyle and getting preventive care are important in promoting health and wellness. Ask your health care provider about: The right schedule for you to have regular tests and exams. Things you can do on your own to prevent diseases and keep yourself healthy. What should I know about diet, weight, and exercise? Eat a healthy diet  Eat a diet that includes plenty of vegetables, fruits, low-fat dairy products, and lean protein. Do not eat a lot of foods that are high in solid fats, added sugars, or sodium. Maintain a healthy weight Body mass index (BMI) is a measurement that can be used to identify possible weight problems. It estimates body fat based on height and weight. Your health care provider can help determine your BMI and help you achieve or maintain a healthy weight. Get regular exercise Get regular exercise. This is one of the most important things you can do for your health. Most adults should: Exercise for at least 150 minutes each week. The exercise should increase your heart rate and make you sweat (moderate-intensity exercise). Do strengthening exercises at least twice a week. This is in addition to the moderate-intensity exercise. Spend less time sitting. Even light physical activity can be beneficial. Watch cholesterol and blood lipids Have your blood tested for lipids and cholesterol at 49 years of age, then have this test every 5 years. You may need to have your cholesterol levels checked more often if: Your lipid or cholesterol levels are high. You are older than 49 years of age. You are at high risk for heart disease. What should I know about cancer screening? Many types of cancers can be detected early and may often be prevented. Depending on your health history and family history, you may need to have cancer screening at various ages. This may include screening for: Colorectal cancer. Prostate cancer. Skin cancer. Lung  cancer. What should I know about heart disease, diabetes, and high blood pressure? Blood pressure and heart disease High blood pressure causes heart disease and increases the risk of stroke. This is more likely to develop in people who have high blood pressure readings or are overweight. Talk with your health care provider about your target blood pressure readings. Have your blood pressure checked: Every 3-5 years if you are 18-39 years of age. Every year if you are 40 years old or older. If you are between the ages of 65 and 75 and are a current or former smoker, ask your health care provider if you should have a one-time screening for abdominal aortic aneurysm (AAA). Diabetes Have regular diabetes screenings. This checks your fasting blood sugar level. Have the screening done: Once every three years after age 45 if you are at a normal weight and have a low risk for diabetes. More often and at a younger age if you are overweight or have a high risk for diabetes. What should I know about preventing infection? Hepatitis B If you have a higher risk for hepatitis B, you should be screened for this virus. Talk with your health care provider to find out if you are at risk for hepatitis B infection. Hepatitis C Blood testing is recommended for: Everyone born from 1945 through 1965. Anyone with known risk factors for hepatitis C. Sexually transmitted infections (STIs) You should be screened each year for STIs, including gonorrhea and chlamydia, if: You are sexually active and are younger than 49 years of age. You are older than 49 years of age and your   health care provider tells you that you are at risk for this type of infection. Your sexual activity has changed since you were last screened, and you are at increased risk for chlamydia or gonorrhea. Ask your health care provider if you are at risk. Ask your health care provider about whether you are at high risk for HIV. Your health care provider  may recommend a prescription medicine to help prevent HIV infection. If you choose to take medicine to prevent HIV, you should first get tested for HIV. You should then be tested every 3 months for as long as you are taking the medicine. Follow these instructions at home: Alcohol use Do not drink alcohol if your health care provider tells you not to drink. If you drink alcohol: Limit how much you have to 0-2 drinks a day. Know how much alcohol is in your drink. In the [place] one drink equals one 12 oz bottle of beer (355 mL), one 5 oz glass of wine (148 mL), or one 1 oz glass of hard liquor (44 mL). Lifestyle Do not use any products that contain nicotine or tobacco. These products include cigarettes, chewing tobacco, and vaping devices, such as e-cigarettes. If you need help quitting, ask your health care provider. Do not use street drugs. Do not share needles. Ask your health care provider for help if you need support or information about quitting drugs. General instructions Schedule regular health, dental, and eye exams. Stay current with your vaccines. Tell your health care provider if: You often feel depressed. You have ever been abused or do not feel safe at home. Summary Adopting a healthy lifestyle and getting preventive care are important in promoting health and wellness. Follow your health care provider's instructions about healthy diet, exercising, and getting tested or screened for diseases. Follow your health care provider's instructions on monitoring your cholesterol and blood pressure. This information is not intended to replace advice given to you by your health care provider. Make sure you discuss any questions you have with your health care provider. Document Revised: 11/18/2020 Document Reviewed: 11/18/2020 Elsevier Patient Education  2024 Elsevier Inc.  

## 2023-02-10 NOTE — Assessment & Plan Note (Signed)
Chronic stable condition Continue atorvastatin 40 mg daily Not taking it at present time Lipid file done today Diet and nutrition discussed The 10-year ASCVD risk score (Arnett DK, et al., 2019) is: 14.3%   Values used to calculate the score:     Age: 47 years     Sex: Male     Is Non-Hispanic African American: Yes     Diabetic: Yes     Tobacco smoker: No     Systolic Blood Pressure: 126 mmHg     Is BP treated: Yes     HDL Cholesterol: 46 mg/dL     Total Cholesterol: 158 mg/dL

## 2023-02-16 ENCOUNTER — Encounter: Payer: Self-pay | Admitting: *Deleted

## 2023-02-16 ENCOUNTER — Telehealth: Payer: Self-pay | Admitting: Emergency Medicine

## 2023-02-16 NOTE — Telephone Encounter (Signed)
Patient said that his cholesterol and A1C tests were not done on the 31st.  He wants to know if he needs to come back in and have the other labs done.  Please call patient and let him know.  Phone:  [phone]

## 2023-02-17 ENCOUNTER — Other Ambulatory Visit (INDEPENDENT_AMBULATORY_CARE_PROVIDER_SITE_OTHER): Payer: 59

## 2023-02-17 DIAGNOSIS — E1159 Type 2 diabetes mellitus with other circulatory complications: Secondary | ICD-10-CM

## 2023-02-17 DIAGNOSIS — E1169 Type 2 diabetes mellitus with other specified complication: Secondary | ICD-10-CM

## 2023-02-17 DIAGNOSIS — I152 Hypertension secondary to endocrine disorders: Secondary | ICD-10-CM

## 2023-02-17 DIAGNOSIS — E785 Hyperlipidemia, unspecified: Secondary | ICD-10-CM

## 2023-02-17 LAB — CBC WITH DIFFERENTIAL/PLATELET
Basophils Absolute: 0 10*3/uL (ref 0.0–0.1)
Basophils Relative: 0.4 % (ref 0.0–3.0)
Eosinophils Absolute: 0.2 10*3/uL (ref 0.0–0.7)
Eosinophils Relative: 2.5 % (ref 0.0–5.0)
HCT: 45.7 % (ref 39.0–52.0)
Hemoglobin: 14.7 g/dL (ref 13.0–17.0)
Lymphocytes Relative: 34.4 % (ref 12.0–46.0)
Lymphs Abs: 2.1 10*3/uL (ref 0.7–4.0)
MCHC: 32.2 g/dL (ref 30.0–36.0)
MCV: 91.7 fl (ref 78.0–100.0)
Monocytes Absolute: 0.4 10*3/uL (ref 0.1–1.0)
Monocytes Relative: 7 % (ref 3.0–12.0)
Neutro Abs: 3.4 10*3/uL (ref 1.4–7.7)
Neutrophils Relative %: 55.7 % (ref 43.0–77.0)
Platelets: 314 10*3/uL (ref 150.0–400.0)
RBC: 4.99 Mil/uL (ref 4.22–5.81)
RDW: 15.4 % (ref 11.5–15.5)
WBC: 6.1 10*3/uL (ref 4.0–10.5)

## 2023-02-17 LAB — COMPREHENSIVE METABOLIC PANEL
ALT: 15 U/L (ref 0–53)
AST: 16 U/L (ref 0–37)
Albumin: 4.2 g/dL (ref 3.5–5.2)
Alkaline Phosphatase: 61 U/L (ref 39–117)
BUN: 13 mg/dL (ref 6–23)
CO2: 27 mEq/L (ref 19–32)
Calcium: 9.1 mg/dL (ref 8.4–10.5)
Chloride: 103 mEq/L (ref 96–112)
Creatinine, Ser: 1.1 mg/dL (ref 0.40–1.50)
GFR: 78.83 mL/min (ref >60.00)
Glucose, Bld: 135 mg/dL — ABNORMAL HIGH (ref 70–99)
Potassium: 4.1 mEq/L (ref 3.5–5.1)
Sodium: 138 mEq/L (ref 135–145)
Total Bilirubin: 0.4 mg/dL (ref 0.2–1.2)
Total Protein: 6.9 g/dL (ref 6.0–8.3)

## 2023-02-17 LAB — LIPID PANEL
Cholesterol: 269 mg/dL — ABNORMAL HIGH (ref 0–200)
HDL: 55.1 mg/dL (ref >39.00)
NonHDL: 214.07
Total CHOL/HDL Ratio: 5
Triglycerides: 340 mg/dL — ABNORMAL HIGH (ref 0.0–149.0)
VLDL: 68 mg/dL — ABNORMAL HIGH (ref 0.0–40.0)

## 2023-02-17 LAB — HEMOGLOBIN A1C: Hgb A1c MFr Bld: 6.9 % — ABNORMAL HIGH (ref 4.6–6.5)

## 2023-02-17 LAB — MICROALBUMIN / CREATININE URINE RATIO
Creatinine,U: 109.8 mg/dL
Microalb Creat Ratio: 0.9 mg/g (ref 0.0–30.0)
Microalb, Ur: 1 mg/dL (ref 0.0–1.9)

## 2023-02-17 LAB — LDL CHOLESTEROL, DIRECT: Direct LDL: 146 mg/dL

## 2023-02-18 ENCOUNTER — Other Ambulatory Visit (HOSPITAL_COMMUNITY): Payer: Self-pay

## 2023-02-18 ENCOUNTER — Other Ambulatory Visit: Payer: Self-pay | Admitting: Internal Medicine

## 2023-02-18 ENCOUNTER — Other Ambulatory Visit: Payer: Self-pay | Admitting: Emergency Medicine

## 2023-02-18 MED ORDER — ATORVASTATIN CALCIUM 40 MG PO TABS
40.0000 mg | ORAL_TABLET | Freq: Every day | ORAL | 1 refills | Status: DC
  Filled 2023-02-18: qty 90, 90d supply, fill #0
  Filled 2023-05-28: qty 90, 90d supply, fill #1

## 2023-03-08 ENCOUNTER — Other Ambulatory Visit (HOSPITAL_COMMUNITY): Payer: Self-pay

## 2023-03-18 ENCOUNTER — Encounter: Payer: Self-pay | Admitting: Emergency Medicine

## 2023-03-18 ENCOUNTER — Ambulatory Visit (INDEPENDENT_AMBULATORY_CARE_PROVIDER_SITE_OTHER): Payer: 59

## 2023-03-18 ENCOUNTER — Ambulatory Visit (INDEPENDENT_AMBULATORY_CARE_PROVIDER_SITE_OTHER): Payer: 59 | Admitting: Emergency Medicine

## 2023-03-18 VITALS — BP 124/80 | HR 85 | Temp 98.2°F | Wt 167.4 lb

## 2023-03-18 DIAGNOSIS — H1011 Acute atopic conjunctivitis, right eye: Secondary | ICD-10-CM | POA: Diagnosis not present

## 2023-03-18 DIAGNOSIS — M545 Low back pain, unspecified: Secondary | ICD-10-CM | POA: Insufficient documentation

## 2023-03-18 DIAGNOSIS — M7918 Myalgia, other site: Secondary | ICD-10-CM | POA: Diagnosis not present

## 2023-03-18 DIAGNOSIS — M25551 Pain in right hip: Secondary | ICD-10-CM | POA: Diagnosis not present

## 2023-03-18 DIAGNOSIS — M47816 Spondylosis without myelopathy or radiculopathy, lumbar region: Secondary | ICD-10-CM | POA: Diagnosis not present

## 2023-03-18 NOTE — Assessment & Plan Note (Signed)
Pain management discussed Recommend to take Tylenol and or Advil as needed May need orthopedic referral in the future

## 2023-03-18 NOTE — Assessment & Plan Note (Signed)
Musculoskeletal in nature Most likely related to activities of daily living X-ray done today.  Report reviewed Pain management discussed

## 2023-03-18 NOTE — Assessment & Plan Note (Signed)
Clinically stable.  No signs of infection. Does not wear contact lenses. Most likely allergic. Advised to use over-the-counter eyedrops for red eyes

## 2023-03-18 NOTE — Assessment & Plan Note (Signed)
With radiation down posterior side of right leg most likely sciatica Pain management discussed Related to activities of daily living X-ray done today.  Report reviewed.

## 2023-03-18 NOTE — Progress Notes (Signed)
Adam Wiley 47 y.o.   Chief Complaint  Patient presents with   Acute Visit    Sharp pain in right leg, started in  the lower back on the right side and continued down the leg, 5 on the pain scale  Redness in the right eye    HISTORY OF PRESENT ILLNESS: This is a 47 y.o. male complaining of pain to the right lumbar area and right hip started couple weeks ago. Ibuprofen helps.  Related to activities of daily living.  Physical work. No other associated symptoms. Also complaining of occasional redness to right eye No other complaints or medical concerns today.  HPI   Prior to Admission medications   Medication Sig Start Date End Date Taking? Authorizing Provider  atorvastatin (LIPITOR) 40 MG tablet Take 1 tablet (40 mg) by mouth daily. 02/18/23  Yes Allan Minotti, Eilleen Kempf, MD  Empagliflozin-metFORMIN HCl ER (SYNJARDY XR) 12.11-998 MG TB24 Take 1 tablet by mouth daily. 12/03/22  Yes Arnette Felts, FNP  esomeprazole (NEXIUM) 40 MG capsule TAKE 1 CAPSULE BY MOUTH DAILY 12/14/22 12/14/23 Yes Dorothyann Peng, MD  fexofenadine (ALLEGRA) 180 MG tablet Take 1 tablet (180 mg total) by mouth daily. 08/15/21  Yes Arnette Felts, FNP  fluticasone (FLONASE) 50 MCG/ACT nasal spray Place 2 sprays into both nostrils daily. 12/15/21  Yes Arnette Felts, FNP  lisinopril-hydrochlorothiazide (ZESTORETIC) 20-12.5 MG tablet Take 1 tablet by mouth daily. 12/03/22  Yes Dorothyann Peng, MD  Aspirin 81 MG CAPS Aspirin 81 mg Patient not taking: Reported on 02/10/2023    [provider]  cyclobenzaprine (FLEXERIL) 10 MG tablet Take 1 tablet (10 mg total) by mouth at bedtime. Patient not taking: Reported on 03/18/2023 02/10/23   Georgina Quint, MD  methocarbamol (ROBAXIN) 500 MG tablet Take 1 tablet (500 mg total) by mouth every 8 (eight) hours as needed for muscle spasms. Patient not taking: Reported on 02/10/2023 11/01/21   Petrucelli, Lelon Mast R, PA-C  naproxen (NAPROSYN) 500 MG tablet Take 1 tablet (500 mg total)  by mouth 2 (two) times daily as needed for moderate pain. Patient not taking: Reported on 02/10/2023 11/01/21   Petrucelli, Pleas Koch, PA-C  traMADol (ULTRAM) 50 MG tablet Take 1 tablet (50 mg total) by mouth every 12 (twelve) hours as needed. Patient not taking: Reported on 02/10/2023 10/03/21   Dorothyann Peng, MD    No Known Allergies  Patient Active Problem List   Diagnosis Date Noted   Hypertension associated with diabetes (HCC) 02/10/2023   Dyslipidemia associated with type 2 diabetes mellitus (HCC) 02/10/2023   Muscle strain of upper back 02/10/2023   Exertional dyspnea 03/07/2020   Pure hypercholesterolemia 03/07/2020   Tobacco abuse 03/07/2020   Uncontrolled type 2 diabetes mellitus with hyperglycemia (HCC) 06/28/2018   Essential hypertension, benign 06/28/2018    Past Medical History:  Diagnosis Date   Diabetes mellitus type 2 in nonobese (HCC) 03/07/2020   Diabetes mellitus without complication (HCC)    Exertional dyspnea 03/07/2020   Hypertension    Pure hypercholesterolemia 03/07/2020   Tobacco abuse 03/07/2020    No past surgical history on file.  Social History   Socioeconomic History   Marital status: Married    Spouse name: Not on file   Number of children: Not on file   Years of education: Not on file   Highest education level: 12th grade  Occupational History   Not on file  Tobacco Use   Smoking status: Former    Current packs/day: 0.00  Average packs/day: 0.3 packs/day for 5.0 years (1.3 ttl pk-yrs)    Types: Cigarettes    Start date: 2017    Quit date: 2022    Years since quitting: 2.6   Smokeless tobacco: Never  Vaping Use   Vaping status: Never Used  Substance and Sexual Activity   Alcohol use: Yes    Comment: occasionally   Drug use: No   Sexual activity: Yes    Birth control/protection: Condom, None  Other Topics Concern   Not on file  Social History Narrative   Not on file   Social Determinants of Health   Financial Resource  Strain: Low Risk  (03/14/2023)   Overall Financial Resource Strain (CARDIA)    Difficulty of Paying Living Expenses: Not hard at all  Food Insecurity: No Food Insecurity (03/14/2023)   Hunger Vital Sign    Worried About Running Out of Food in the Last Year: Never true    Ran Out of Food in the Last Year: Never true  Transportation Needs: No Transportation Needs (03/14/2023)   PRAPARE - Administrator, Civil Service (Medical): No    Lack of Transportation (Non-Medical): No  Physical Activity: Sufficiently Active (03/14/2023)   Exercise Vital Sign    Days of Exercise per Week: 3 days    Minutes of Exercise per Session: 90 min  Stress: No Stress Concern Present (03/14/2023)   [employer] of Occupational Health - Occupational Stress Questionnaire    Feeling of Stress : Not at all  Social Connections: Socially Integrated (03/14/2023)   Social Connection and Isolation Panel [NHANES]    Frequency of Communication with Friends and Family: More than three times a week    Frequency of Social Gatherings with Friends and Family: Twice a week    Attends Religious Services: More than 4 times per year    Active Member of [employer] or Organizations: Yes    Attends Banker Meetings: More than 4 times per year    Marital Status: Married  Catering manager Violence: Unknown (10/13/2021)   Received from [facility]   HITS    Physically Hurt: Not on file    Insult or Talk Down To: Not on file    Threaten Physical Harm: Not on file    Scream or Curse: Not on file    Family History  Problem Relation Age of Onset   Diabetes Mother    Diabetes Father    High blood pressure Other    Hypertension Brother    Hypertension Sister    Diabetes Sister      Review of Systems  Constitutional: Negative.  Negative for chills and fever.  HENT: Negative.  Negative for congestion and sore throat.   Eyes:  Positive for redness.  Respiratory: Negative.  Negative for cough and shortness of  breath.   Cardiovascular: Negative.  Negative for chest pain and palpitations.  Gastrointestinal:  Negative for abdominal pain, diarrhea, nausea and vomiting.  Genitourinary: Negative.  Negative for dysuria and hematuria.  Musculoskeletal:  Positive for back pain and joint pain (Right hip pain).  Skin: Negative.  Negative for rash.  Neurological: Negative.  Negative for dizziness and headaches.  All other systems reviewed and are negative.   Vitals:   03/18/23 0845  BP: 124/80  Pulse: 85  Temp: 98.2 F (36.8 C)  SpO2: 99%    Physical Exam Vitals reviewed.  Constitutional:      Appearance: Normal appearance.  HENT:     Head:  Normocephalic.     Mouth/Throat:     Mouth: Mucous membranes are moist.     Pharynx: Oropharynx is clear.  Eyes:     Extraocular Movements: Extraocular movements intact.     Conjunctiva/sclera: Conjunctivae normal.     Pupils: Pupils are equal, round, and reactive to light.  Cardiovascular:     Rate and Rhythm: Normal rate and regular rhythm.     Pulses: Normal pulses.     Heart sounds: Normal heart sounds.  Pulmonary:     Effort: Pulmonary effort is normal.     Breath sounds: Normal breath sounds.  Musculoskeletal:     Cervical back: No tenderness.  Lymphadenopathy:     Cervical: No cervical adenopathy.  Skin:    General: Skin is warm and dry.     Capillary Refill: Capillary refill takes less than 2 seconds.  Neurological:     General: No focal deficit present.     Mental Status: He is alert and oriented to person, place, and time.  Psychiatric:        Mood and Affect: Mood normal.        Behavior: Behavior normal.    DG Lumbar Spine 2-3 Views  Result Date: 03/18/2023 CLINICAL DATA:  Low back and right hip pain for 1 month. EXAM: LUMBAR SPINE - 2-3 VIEW COMPARISON:  None Available. FINDINGS: Five non-rib-bearing lumbar vertebra. Reversal of normal lordosis. No listhesis. Normal vertebral body heights. No fracture or compression deformity.  Anterior spurring at multiple levels with preservation of disc spaces. There is L5-S1 facet hypertrophy. No focal bone lesions. Unremarkable soft tissues. IMPRESSION: 1. Reversal of normal lordosis may be positional or due to muscle spasm. 2. Mild spondylosis with anterior spurring. L5-S1 facet hypertrophy. Electronically Signed   By: Narda Rutherford M.D.   On: 03/18/2023 09:47   DG HIP UNILAT W OR W/O PELVIS 2-3 VIEWS RIGHT  Result Date: 03/18/2023 CLINICAL DATA:  Low back and right hip pain for 1 month. No known injury. EXAM: DG HIP (WITH OR WITHOUT PELVIS) 2-3V RIGHT COMPARISON:  None Available. FINDINGS: No fracture of the pelvis or right hip. Femoral head is well seated. There is minor acetabular spurring. No erosion or avascular necrosis. No evidence of focal bone lesion. Bony pelvis is intact. Pubic symphysis and sacroiliac joints are congruent. Faint soft tissue calcification adjacent to the greater trochanter. IMPRESSION: 1. Minor degenerative acetabular spurring. 2. Faint soft tissue calcification adjacent to the greater trochanter may represent calcific tendinopathy. Electronically Signed   By: Narda Rutherford M.D.   On: 03/18/2023 09:46     ASSESSMENT & PLAN: A total of 42 minutes was spent with the patient and counseling/coordination of care regarding preparing for this visit, review of most recent office visit notes, differential diagnosis of lumbar and right hip pain, review of x-ray report done today, pain management, prognosis, documentation, and need for follow-up.  Problem List Items Addressed This Visit       Other   Lumbar pain - Primary    With radiation down posterior side of right leg most likely sciatica Pain management discussed Related to activities of daily living X-ray done today.  Report reviewed.      Relevant Orders   DG Lumbar Spine 2-3 Views   Right hip pain    Musculoskeletal in nature Most likely related to activities of daily living X-ray done today.   Report reviewed Pain management discussed      Relevant Orders   DG HIP UNILAT  W OR W/O PELVIS 2-3 VIEWS RIGHT   Allergic conjunctivitis of right eye    Clinically stable.  No signs of infection. Does not wear contact lenses. Most likely allergic. Advised to use over-the-counter eyedrops for red eyes      Musculoskeletal pain    Pain management discussed Recommend to take Tylenol and or Advil as needed May need orthopedic referral in the future      Patient Instructions  Sciatica  Sciatica is pain, weakness, tingling, or loss of feeling (numbness) along the sciatic nerve. The sciatic nerve starts in the lower back and goes down the back of each leg. Sciatica usually affects one side of the body. Sciatica usually goes away on its own or with treatment. Sometimes, sciatica may come back. What are the causes? This condition happens when the sciatic nerve is pinched or has pressure put on it. This may be caused by: A disk in between the bones of the spine bulging out too far (herniated disk). Changes in the spinal disks due to aging. A condition that affects a muscle in the butt. Extra bone growth near the sciatic nerve. A break (fracture) of the area between your hip bones (pelvis). Pregnancy. Tumor. This is rare. What increases the risk? You are more likely to develop this condition if you: Play sports that put pressure or stress on the spine. Have poor strength and ease of movement (flexibility). Have had a back injury or back surgery. Sit for long periods of time. Do activities that involve bending or lifting over and over again. Are very overweight (obese). What are the signs or symptoms? Symptoms can vary from mild to very bad. They may include: Any of these problems in the lower back, leg, hip, or butt: Mild tingling, loss of feeling, or dull aches. A burning feeling. Sharp pains. Loss of feeling in the back of the calf or the sole of the foot. Leg weakness. Very  bad back pain that makes it hard to move. These symptoms may get worse when you cough, sneeze, or laugh. They may also get worse when you sit or stand for long periods of time. How is this treated? This condition often gets better without any treatment. However, treatment may include: Changing or cutting back on physical activity when you have pain. Exercising, including strengthening and stretching. Putting ice or heat on the affected area. Shots of medicines to relieve pain and swelling or to relax your muscles. Surgery. Follow these instructions at home: Medicines Take over-the-counter and prescription medicines only as told by your doctor. Ask your doctor if you should avoid driving or using machines while you are taking your medicine. Managing pain     If told, put ice on the affected area. To do this: Put ice in a plastic bag. Place a towel between your skin and the bag. Leave the ice on for 20 minutes, 2-3 times a day. If your skin turns bright red, take off the ice right away to prevent skin damage. The risk of skin damage is higher if you cannot feel pain, heat, or cold. If told, put heat on the affected area. Do this as often as told by your doctor. Use the heat source that your doctor tells you to use, such as a moist heat pack or a heating pad. Place a towel between your skin and the heat source. Leave the heat on for 20-30 minutes. If your skin turns bright red, take off the heat right away to prevent burns.  The risk of burns is higher if you cannot feel pain, heat, or cold. Activity  Return to your normal activities when your doctor says that it is safe. Avoid activities that make your symptoms worse. Take short rests during the day. When you rest for a long time, do some physical activity or stretching between periods of rest. Avoid sitting for a long time without moving. Get up and move around at least one time each hour. Do exercises and stretches as told by your  doctor. Do not lift anything that is heavier than 10 lb (4.5 kg). Avoid lifting heavy things even when you do not have symptoms. Avoid lifting heavy things over and over. When you lift objects, always lift in a way that is safe for your body. To do this, you should: Bend your knees. Keep the object close to your body. Avoid twisting. General instructions Stay at a healthy weight. Wear comfortable shoes that support your feet. Avoid wearing high heels. Avoid sleeping on a mattress that is too soft or too hard. You might have less pain if you sleep on a mattress that is firm enough to support your back. Contact a doctor if: Your pain is not controlled by medicine. Your pain does not get better. Your pain gets worse. Your pain lasts longer than 4 weeks. You lose weight without trying. Get help right away if: You cannot control when you pee (urinate) or poop (have a bowel movement). You have weakness in any of these areas and it gets worse: Lower back. The area between your hip bones. Butt. Legs. You have redness or swelling of your back. You have a burning feeling when you pee. Summary Sciatica is pain, weakness, tingling, or loss of feeling (numbness) along the sciatic nerve. This may include the lower back, legs, hips, and butt. This condition happens when the sciatic nerve is pinched or has pressure put on it. Treatment often includes rest, exercise, medicines, and putting ice or heat on the affected area. This information is not intended to replace advice given to you by your health care provider. Make sure you discuss any questions you have with your health care provider. Document Revised: 10/06/2021 Document Reviewed: 10/06/2021 Elsevier Patient Education  2024 Elsevier Inc.     Edwina Barth, MD [facility]

## 2023-03-18 NOTE — Patient Instructions (Signed)
Sciatica  Sciatica is pain, weakness, tingling, or loss of feeling (numbness) along the sciatic nerve. The sciatic nerve starts in the lower back and goes down the back of each leg. Sciatica usually affects one side of the body. Sciatica usually goes away on its own or with treatment. Sometimes, sciatica may come back. What are the causes? This condition happens when the sciatic nerve is pinched or has pressure put on it. This may be caused by: A disk in between the bones of the spine bulging out too far (herniated disk). Changes in the spinal disks due to aging. A condition that affects a muscle in the butt. Extra bone growth near the sciatic nerve. A break (fracture) of the area between your hip bones (pelvis). Pregnancy. Tumor. This is rare. What increases the risk? You are more likely to develop this condition if you: Play sports that put pressure or stress on the spine. Have poor strength and ease of movement (flexibility). Have had a back injury or back surgery. Sit for long periods of time. Do activities that involve bending or lifting over and over again. Are very overweight (obese). What are the signs or symptoms? Symptoms can vary from mild to very bad. They may include: Any of these problems in the lower back, leg, hip, or butt: Mild tingling, loss of feeling, or dull aches. A burning feeling. Sharp pains. Loss of feeling in the back of the calf or the sole of the foot. Leg weakness. Very bad back pain that makes it hard to move. These symptoms may get worse when you cough, sneeze, or laugh. They may also get worse when you sit or stand for long periods of time. How is this treated? This condition often gets better without any treatment. However, treatment may include: Changing or cutting back on physical activity when you have pain. Exercising, including strengthening and stretching. Putting ice or heat on the affected area. Shots of medicines to relieve pain and  swelling or to relax your muscles. Surgery. Follow these instructions at home: Medicines Take over-the-counter and prescription medicines only as told by your doctor. Ask your doctor if you should avoid driving or using machines while you are taking your medicine. Managing pain     If told, put ice on the affected area. To do this: Put ice in a plastic bag. Place a towel between your skin and the bag. Leave the ice on for 20 minutes, 2-3 times a day. If your skin turns bright red, take off the ice right away to prevent skin damage. The risk of skin damage is higher if you cannot feel pain, heat, or cold. If told, put heat on the affected area. Do this as often as told by your doctor. Use the heat source that your doctor tells you to use, such as a moist heat pack or a heating pad. Place a towel between your skin and the heat source. Leave the heat on for 20-30 minutes. If your skin turns bright red, take off the heat right away to prevent burns. The risk of burns is higher if you cannot feel pain, heat, or cold. Activity  Return to your normal activities when your doctor says that it is safe. Avoid activities that make your symptoms worse. Take short rests during the day. When you rest for a long time, do some physical activity or stretching between periods of rest. Avoid sitting for a long time without moving. Get up and move around at least one time each   hour. Do exercises and stretches as told by your doctor. Do not lift anything that is heavier than 10 lb (4.5 kg). Avoid lifting heavy things even when you do not have symptoms. Avoid lifting heavy things over and over. When you lift objects, always lift in a way that is safe for your body. To do this, you should: Bend your knees. Keep the object close to your body. Avoid twisting. General instructions Stay at a healthy weight. Wear comfortable shoes that support your feet. Avoid wearing high heels. Avoid sleeping on a mattress  that is too soft or too hard. You might have less pain if you sleep on a mattress that is firm enough to support your back. Contact a doctor if: Your pain is not controlled by medicine. Your pain does not get better. Your pain gets worse. Your pain lasts longer than 4 weeks. You lose weight without trying. Get help right away if: You cannot control when you pee (urinate) or poop (have a bowel movement). You have weakness in any of these areas and it gets worse: Lower back. The area between your hip bones. Butt. Legs. You have redness or swelling of your back. You have a burning feeling when you pee. Summary Sciatica is pain, weakness, tingling, or loss of feeling (numbness) along the sciatic nerve. This may include the lower back, legs, hips, and butt. This condition happens when the sciatic nerve is pinched or has pressure put on it. Treatment often includes rest, exercise, medicines, and putting ice or heat on the affected area. This information is not intended to replace advice given to you by your health care provider. Make sure you discuss any questions you have with your health care provider. Document Revised: 10/06/2021 Document Reviewed: 10/06/2021 Elsevier Patient Education  2024 [employer]  

## 2023-05-10 ENCOUNTER — Emergency Department (HOSPITAL_COMMUNITY): Payer: 59

## 2023-05-10 ENCOUNTER — Other Ambulatory Visit: Payer: Self-pay

## 2023-05-10 ENCOUNTER — Encounter (HOSPITAL_COMMUNITY): Payer: Self-pay | Admitting: *Deleted

## 2023-05-10 ENCOUNTER — Emergency Department (HOSPITAL_COMMUNITY)
Admission: EM | Admit: 2023-05-10 | Discharge: 2023-05-10 | Disposition: A | Discharge status: Home / Self Care | Payer: 59 | Attending: Emergency Medicine | Admitting: Emergency Medicine

## 2023-05-10 ENCOUNTER — Other Ambulatory Visit (HOSPITAL_COMMUNITY): Payer: Self-pay

## 2023-05-10 DIAGNOSIS — J069 Acute upper respiratory infection, unspecified: Secondary | ICD-10-CM | POA: Diagnosis not present

## 2023-05-10 DIAGNOSIS — I10 Essential (primary) hypertension: Secondary | ICD-10-CM | POA: Diagnosis not present

## 2023-05-10 DIAGNOSIS — R0602 Shortness of breath: Secondary | ICD-10-CM | POA: Insufficient documentation

## 2023-05-10 DIAGNOSIS — R0789 Other chest pain: Secondary | ICD-10-CM | POA: Diagnosis not present

## 2023-05-10 DIAGNOSIS — Z7982 Long term (current) use of aspirin: Secondary | ICD-10-CM | POA: Diagnosis not present

## 2023-05-10 DIAGNOSIS — E119 Type 2 diabetes mellitus without complications: Secondary | ICD-10-CM | POA: Insufficient documentation

## 2023-05-10 DIAGNOSIS — Z1152 Encounter for screening for COVID-19: Secondary | ICD-10-CM | POA: Diagnosis not present

## 2023-05-10 DIAGNOSIS — R079 Chest pain, unspecified: Secondary | ICD-10-CM | POA: Diagnosis not present

## 2023-05-10 DIAGNOSIS — Z79899 Other long term (current) drug therapy: Secondary | ICD-10-CM | POA: Diagnosis not present

## 2023-05-10 LAB — TROPONIN I (HIGH SENSITIVITY)
Troponin I (High Sensitivity): 4 ng/L (ref <18)
Troponin I (High Sensitivity): 5 ng/L (ref <18)

## 2023-05-10 LAB — RESP PANEL BY RT-PCR (RSV, FLU A&B, COVID)  RVPGX2
Influenza A by PCR: NEGATIVE
Influenza B by PCR: NEGATIVE
Resp Syncytial Virus by PCR: NEGATIVE
SARS Coronavirus 2 by RT PCR: NEGATIVE

## 2023-05-10 LAB — CBC
HCT: 45.6 % (ref 39.0–52.0)
Hemoglobin: 14.8 g/dL (ref 13.0–17.0)
MCH: 30.1 pg (ref 26.0–34.0)
MCHC: 32.5 g/dL (ref 30.0–36.0)
MCV: 92.7 fL (ref 80.0–100.0)
Platelets: 272 10*3/uL (ref 150–400)
RBC: 4.92 MIL/uL (ref 4.22–5.81)
RDW: 14.2 % (ref 11.5–15.5)
WBC: 8.8 10*3/uL (ref 4.0–10.5)
nRBC: 0 % (ref 0.0–0.2)

## 2023-05-10 LAB — BASIC METABOLIC PANEL
Anion gap: 8 (ref 5–15)
BUN: 15 mg/dL (ref 6–20)
CO2: 25 mmol/L (ref 22–32)
Calcium: 9.1 mg/dL (ref 8.9–10.3)
Chloride: 104 mmol/L (ref 98–111)
Creatinine, Ser: 1.14 mg/dL (ref 0.61–1.24)
GFR, Estimated: >60 mL/min (ref >60)
Glucose, Bld: 224 mg/dL — ABNORMAL HIGH (ref 70–99)
Potassium: 3.8 mmol/L (ref 3.5–5.1)
Sodium: 137 mmol/L (ref 135–145)

## 2023-05-10 LAB — D-DIMER, QUANTITATIVE: D-Dimer, Quant: <0.27 ug{FEU}/mL (ref 0.00–0.50)

## 2023-05-10 MED ORDER — LIDOCAINE VISCOUS HCL 2 % MT SOLN
15.0000 mL | Freq: Once | OROMUCOSAL | Status: DC
  Filled 2023-05-10: qty 15

## 2023-05-10 MED ORDER — ALUM & MAG HYDROXIDE-SIMETH 200-200-20 MG/5ML PO SUSP
30.0000 mL | Freq: Once | ORAL | Status: AC
  Administered 2023-05-10: 30 mL via ORAL
  Filled 2023-05-10: qty 30

## 2023-05-10 NOTE — ED Provider Notes (Incomplete)
[place] EMERGENCY DEPARTMENT AT [facility] Provider Note   CSN: [id number] Arrival date & time: 05/10/23  1026     History {Add pertinent medical, surgical, social history, OB history to HPI:1} Chief Complaint  Patient presents with   Chest Pain    Adam Wiley is a 47 y.o. male.   Chest Pain      Home Medications Prior to Admission medications   Medication Sig Start Date End Date Taking? Authorizing Provider  Aspirin 81 MG CAPS Aspirin 81 mg Patient not taking: Reported on 02/10/2023    [provider]  atorvastatin (LIPITOR) 40 MG tablet Take 1 tablet (40 mg) by mouth daily. 02/18/23   Georgina Quint, MD  cyclobenzaprine (FLEXERIL) 10 MG tablet Take 1 tablet (10 mg total) by mouth at bedtime. Patient not taking: Reported on 03/18/2023 02/10/23   Georgina Quint, MD  Empagliflozin-metFORMIN HCl ER (SYNJARDY XR) 12.11-998 MG TB24 Take 1 tablet by mouth daily. 12/03/22   Arnette Felts, FNP  esomeprazole (NEXIUM) 40 MG capsule TAKE 1 CAPSULE BY MOUTH DAILY 12/14/22 12/14/23  Dorothyann Peng, MD  fexofenadine (ALLEGRA) 180 MG tablet Take 1 tablet (180 mg total) by mouth daily. 08/15/21   Arnette Felts, FNP  fluticasone (FLONASE) 50 MCG/ACT nasal spray Place 2 sprays into both nostrils daily. 12/15/21   Arnette Felts, FNP  lisinopril-hydrochlorothiazide (ZESTORETIC) 20-12.5 MG tablet Take 1 tablet by mouth daily. 12/03/22   Dorothyann Peng, MD  methocarbamol (ROBAXIN) 500 MG tablet Take 1 tablet (500 mg total) by mouth every 8 (eight) hours as needed for muscle spasms. Patient not taking: Reported on 02/10/2023 11/01/21   Petrucelli, Lelon Mast R, PA-C  naproxen (NAPROSYN) 500 MG tablet Take 1 tablet (500 mg total) by mouth 2 (two) times daily as needed for moderate pain. Patient not taking: Reported on 02/10/2023 11/01/21   Petrucelli, Pleas Koch, PA-C  traMADol (ULTRAM) 50 MG tablet Take 1 tablet (50 mg total) by mouth every 12 (twelve) hours as needed. Patient  not taking: Reported on 02/10/2023 10/03/21   Dorothyann Peng, MD      Allergies    Patient has no known allergies.    Review of Systems   Review of Systems  Cardiovascular:  Positive for chest pain.    Physical Exam Updated Vital Signs BP (!) 154/91 (BP Location: Left Arm)   Pulse 92   Temp 98.5 F (36.9 C) (Oral)   Resp 16   Ht 6' (1.829 m)   Wt 81.6 kg   SpO2 100%   BMI 24.41 kg/m  Physical Exam  ED Results / Procedures / Treatments   Labs (all labs ordered are listed, but only abnormal results are displayed) Labs Reviewed  BASIC METABOLIC PANEL - Abnormal; Notable for the following components:      Result Value   Glucose, Bld 224 (*)    All other components within normal limits  CBC  TROPONIN I (HIGH SENSITIVITY)  TROPONIN I (HIGH SENSITIVITY)    EKG EKG Interpretation Date/Time:  Monday May 10 2023 10:35:15 EDT Ventricular Rate:  78 PR Interval:  131 QRS Duration:  88 QT Interval:  345 QTC Calculation: 406 R Axis:   86  Text Interpretation: Sinus rhythm Anteroseptal infarct, old Baseline wander in lead(s) I III aVL Confirmed by Ernie Avena ([id number]) on 05/10/2023 12:05:49 PM  Radiology DG Chest 2 View  Result Date: 05/10/2023 CLINICAL DATA:  Chest pain.  Occasional shortness of breath. EXAM: CHEST - 2 VIEW COMPARISON:  10/31/2021.  FINDINGS: Bilateral lung fields are clear. Bilateral costophrenic angles are clear. Normal cardio-mediastinal silhouette. No acute osseous abnormalities. The soft tissues are within normal limits. IMPRESSION: No active cardiopulmonary disease. Electronically Signed   By: Jules Schick M.D.   On: 05/10/2023 12:28    Procedures Procedures  {Document cardiac monitor, telemetry assessment procedure when appropriate:1}  Medications Ordered in ED Medications - No data to display  ED Course/ Medical Decision Making/ A&P   {   Click here for ABCD2, HEART and other calculatorsREFRESH Note before signing :1}                               Medical Decision Making Amount and/or Complexity of Data Reviewed Labs: ordered. Radiology: ordered.   ***  {Document critical care time when appropriate:1} {Document review of labs and clinical decision tools ie heart score, Chads2Vasc2 etc:1}  {Document your independent review of radiology images, and any outside records:1} {Document your discussion with family members, caretakers, and with consultants:1} {Document social determinants of health affecting pt's care:1} {Document your decision making why or why not admission, treatments were needed:1} Final Clinical Impression(s) / ED Diagnoses Final diagnoses:  None    Rx / DC Orders ED Discharge Orders     None

## 2023-05-10 NOTE — ED Triage Notes (Signed)
Chest pain started last night goes and comes. Today it felt "weird". No other associated symptoms. States SHOB at times but has this prior to cp.

## 2023-05-10 NOTE — Discharge Instructions (Addendum)
Please follow-up with your primary care provider for consideration for outpatient cardiac stress testing.  Your symptoms are consistent with potential viral upper respiratory infection.  Your cardiac workup clued labs to include labs, chest x-ray, and EKG were overall reassuring.

## 2023-05-13 ENCOUNTER — Ambulatory Visit (INDEPENDENT_AMBULATORY_CARE_PROVIDER_SITE_OTHER): Payer: 59 | Admitting: Emergency Medicine

## 2023-05-13 ENCOUNTER — Encounter: Payer: Self-pay | Admitting: Emergency Medicine

## 2023-05-13 VITALS — BP 130/88 | HR 80 | Temp 97.9°F | Ht 72.0 in | Wt 172.5 lb

## 2023-05-13 DIAGNOSIS — E1159 Type 2 diabetes mellitus with other circulatory complications: Secondary | ICD-10-CM

## 2023-05-13 DIAGNOSIS — E785 Hyperlipidemia, unspecified: Secondary | ICD-10-CM

## 2023-05-13 DIAGNOSIS — E1169 Type 2 diabetes mellitus with other specified complication: Secondary | ICD-10-CM | POA: Diagnosis not present

## 2023-05-13 DIAGNOSIS — I152 Hypertension secondary to endocrine disorders: Secondary | ICD-10-CM

## 2023-05-13 DIAGNOSIS — R079 Chest pain, unspecified: Secondary | ICD-10-CM | POA: Insufficient documentation

## 2023-05-13 NOTE — Progress Notes (Signed)
Adam Wiley 47 y.o.   Chief Complaint  Patient presents with   Medical Management of Chronic Issues    f/u appt, patient states he went to the ED on Monday for chest pains, patient is not having any more chest pains, he states the ED provider recommended a stress test.     HISTORY OF PRESENT ILLNESS: This is a 47 y.o. male here for 47-month follow-up of chronic medical conditions including hypertension, diabetes, and dyslipidemia. Emergency department visit on 05/10/2023 due to chest pain.  Negative workup. No more chest pain episodes. Denies chest pain on exertion. Lab Results  Component Value Date   HGBA1C 6.9 (H) 02/17/2023    HPI   Prior to Admission medications   Medication Sig Start Date End Date Taking? Authorizing Provider  Aspirin 81 MG CAPS Aspirin 81 mg Patient not taking: Reported on 02/10/2023    [provider]  atorvastatin (LIPITOR) 40 MG tablet Take 1 tablet (40 mg) by mouth daily. 02/18/23   Georgina Quint, MD  cyclobenzaprine (FLEXERIL) 10 MG tablet Take 1 tablet (10 mg total) by mouth at bedtime. Patient not taking: Reported on 03/18/2023 02/10/23   Georgina Quint, MD  Empagliflozin-metFORMIN HCl ER (SYNJARDY XR) 12.11-998 MG TB24 Take 1 tablet by mouth daily. 12/03/22   Arnette Felts, FNP  esomeprazole (NEXIUM) 40 MG capsule TAKE 1 CAPSULE BY MOUTH DAILY 12/14/22 12/14/23  Dorothyann Peng, MD  fexofenadine (ALLEGRA) 180 MG tablet Take 1 tablet (180 mg total) by mouth daily. 08/15/21   Arnette Felts, FNP  fluticasone (FLONASE) 50 MCG/ACT nasal spray Place 2 sprays into both nostrils daily. 12/15/21   Arnette Felts, FNP  lisinopril-hydrochlorothiazide (ZESTORETIC) 20-12.5 MG tablet Take 1 tablet by mouth daily. 12/03/22   Dorothyann Peng, MD  methocarbamol (ROBAXIN) 500 MG tablet Take 1 tablet (500 mg total) by mouth every 8 (eight) hours as needed for muscle spasms. Patient not taking: Reported on 02/10/2023 11/01/21   Petrucelli, Lelon Mast R, PA-C   naproxen (NAPROSYN) 500 MG tablet Take 1 tablet (500 mg total) by mouth 2 (two) times daily as needed for moderate pain. Patient not taking: Reported on 02/10/2023 11/01/21   Petrucelli, Pleas Koch, PA-C  traMADol (ULTRAM) 50 MG tablet Take 1 tablet (50 mg total) by mouth every 12 (twelve) hours as needed. Patient not taking: Reported on 02/10/2023 10/03/21   Dorothyann Peng, MD    No Known Allergies  Patient Active Problem List   Diagnosis Date Noted   Hypertension associated with diabetes (HCC) 02/10/2023   Dyslipidemia associated with type 2 diabetes mellitus (HCC) 02/10/2023   Muscle strain of upper back 02/10/2023   Pure hypercholesterolemia 03/07/2020   Tobacco abuse 03/07/2020   Uncontrolled type 2 diabetes mellitus with hyperglycemia (HCC) 06/28/2018   Essential hypertension, benign 06/28/2018    Past Medical History:  Diagnosis Date   Diabetes mellitus type 2 in nonobese (HCC) 03/07/2020   Diabetes mellitus without complication (HCC)    Exertional dyspnea 03/07/2020   Hypertension    Pure hypercholesterolemia 03/07/2020   Tobacco abuse 03/07/2020    History reviewed. No pertinent surgical history.  Social History   Socioeconomic History   Marital status: Married    Spouse name: Not on file   Number of children: Not on file   Years of education: Not on file   Highest education level: 12th grade  Occupational History   Not on file  Tobacco Use   Smoking status: Former    Current packs/day: 0.00  Average packs/day: 0.3 packs/day for 5.0 years (1.3 ttl pk-yrs)    Types: Cigarettes    Start date: 2017    Quit date: 2022    Years since quitting: 2.8   Smokeless tobacco: Never  Vaping Use   Vaping status: Never Used  Substance and Sexual Activity   Alcohol use: Yes    Comment: occasionally   Drug use: No   Sexual activity: Yes    Birth control/protection: Condom, None  Other Topics Concern   Not on file  Social History Narrative   Not on file   Social  Determinants of Health   Financial Resource Strain: Low Risk  (05/09/2023)   Overall Financial Resource Strain (CARDIA)    Difficulty of Paying Living Expenses: Not hard at all  Food Insecurity: No Food Insecurity (05/09/2023)   Hunger Vital Sign    Worried About Running Out of Food in the Last Year: Never true    Ran Out of Food in the Last Year: Never true  Transportation Needs: No Transportation Needs (05/09/2023)   PRAPARE - Administrator, Civil Service (Medical): No    Lack of Transportation (Non-Medical): No  Physical Activity: Insufficiently Active (05/09/2023)   Exercise Vital Sign    Days of Exercise per Week: 3 days    Minutes of Exercise per Session: 30 min  Stress: No Stress Concern Present (05/09/2023)   [employer] of Occupational Health - Occupational Stress Questionnaire    Feeling of Stress : Not at all  Social Connections: Socially Integrated (05/09/2023)   Social Connection and Isolation Panel [NHANES]    Frequency of Communication with Friends and Family: Three times a week    Frequency of Social Gatherings with Friends and Family: Once a week    Attends Religious Services: 1 to 4 times per year    Active Member of [employer] or Organizations: Yes    Attends Engineer, structural: More than 4 times per year    Marital Status: Married  Catering manager Violence: Unknown (10/13/2021)   Received from [employer], [facility]   HITS    Physically Hurt: Not on file    Insult or Talk Down To: Not on file    Threaten Physical Harm: Not on file    Scream or Curse: Not on file    Family History  Problem Relation Age of Onset   Diabetes Mother    Diabetes Father    High blood pressure Other    Hypertension Brother    Hypertension Sister    Diabetes Sister      Review of Systems  Constitutional: Negative.  Negative for chills and fever.  HENT: Negative.  Negative for congestion and sore throat.   Respiratory: Negative.  Negative for  cough and shortness of breath.   Cardiovascular: Negative.  Negative for chest pain and palpitations.  Gastrointestinal:  Negative for abdominal pain, diarrhea, nausea and vomiting.  Genitourinary: Negative.  Negative for dysuria and hematuria.  Skin: Negative.  Negative for rash.  Neurological: Negative.  Negative for dizziness and headaches.  All other systems reviewed and are negative.   Today's Vitals   05/13/23 0806  BP: 130/88  Pulse: 80  Temp: 97.9 F (36.6 C)  TempSrc: Oral  SpO2: 98%  Weight: 172 lb 8 oz (78.2 kg)  Height: 6' (1.829 m)   Body mass index is 23.4 kg/m.   Physical Exam Vitals reviewed.  Constitutional:      Appearance: Normal appearance.  HENT:  Head: Normocephalic.  Eyes:     Extraocular Movements: Extraocular movements intact.  Cardiovascular:     Rate and Rhythm: Normal rate and regular rhythm.     Pulses: Normal pulses.     Heart sounds: Normal heart sounds.  Pulmonary:     Effort: Pulmonary effort is normal.     Breath sounds: Normal breath sounds.  Musculoskeletal:     Right lower leg: No edema.     Left lower leg: No edema.  Skin:    General: Skin is warm and dry.  Neurological:     Mental Status: He is alert and oriented to person, place, and time.  Psychiatric:        Mood and Affect: Mood normal.        Behavior: Behavior normal.      ASSESSMENT & PLAN: A total of 45 minutes was spent with the patient and counseling/coordination of care regarding preparing for this visit, review of most recent office visit notes, review of most recent emergency department visit notes, review of most recent blood work results, review of most recent EKGs done in the emergency department, review of most recent chest x-ray done, differential diagnosis of chest pain and need for cardiac workup including cardiac CTA, review of multiple chronic medical conditions including diabetes and hypertension, review of all medications, interpretation of today's  hemoglobin A1c, education on nutrition, prognosis, documentation and need for follow-up.  Problem List Items Addressed This Visit       Cardiovascular and Mediastinum   Hypertension associated with diabetes (HCC) - Primary    Well-controlled hypertension Continue Zestoretic 20-12.5 mg Hemoglobin A1c is 7.1, not at goal. Cardiovascular risks related to hypertension and diabetes discussed. Continue Synjardy 12.11-998 mg daily Diet and nutrition discussed        Endocrine   Dyslipidemia associated with type 2 diabetes mellitus (HCC)    Chronic stable conditions Continue atorvastatin 40 mg daily. The 10-year ASCVD risk score (Arnett DK, et al., 2019) is: 16.6%   Values used to calculate the score:     Age: 48 years     Sex: Male     Is Non-Hispanic African American: Yes     Diabetic: Yes     Tobacco smoker: No     Systolic Blood Pressure: 130 mmHg     Is BP treated: Yes     HDL Cholesterol: 55.1 mg/dL     Total Cholesterol: 269 mg/dL         Other   Chest pain    Nonspecific chest pain.  Clinically stable.  No red flag signs or symptoms. Patient however has multiple cardiac risk factors such as diabetes, hypertension, dyslipidemia and former smoker. Needs cardiac workup. Recommend to start with cardiac CTA May need cardiology evaluation Recommend to start daily baby aspirin until cardiac workup is completed      Relevant Orders   CT CORONARY FRACTIONAL FLOW RESERVE (FFR)   Patient Instructions  Diabetes Mellitus and Nutrition, Adult When you have diabetes, or diabetes mellitus, it is very important to have healthy eating habits because your blood sugar (glucose) levels are greatly affected by what you eat and drink. Eating healthy foods in the right amounts, at about the same times every day, can help you: Manage your blood glucose. Lower your risk of heart disease. Improve your blood pressure. Reach or maintain a healthy weight. What can affect my meal plan? Every  person with diabetes is different, and each person has different needs for  a meal plan. Your health care provider may recommend that you work with a dietitian to make a meal plan that is best for you. Your meal plan may vary depending on factors such as: The calories you need. The medicines you take. Your weight. Your blood glucose, blood pressure, and cholesterol levels. Your activity level. Other health conditions you have, such as heart or kidney disease. How do carbohydrates affect me? Carbohydrates, also called carbs, affect your blood glucose level more than any other type of food. Eating carbs raises the amount of glucose in your blood. It is important to know how many carbs you can safely have in each meal. This is different for every person. Your dietitian can help you calculate how many carbs you should have at each meal and for each snack. How does alcohol affect me? Alcohol can cause a decrease in blood glucose (hypoglycemia), especially if you use insulin or take certain diabetes medicines by mouth. Hypoglycemia can be a life-threatening condition. Symptoms of hypoglycemia, such as sleepiness, dizziness, and confusion, are similar to symptoms of having too much alcohol. Do not drink alcohol if: Your health care provider tells you not to drink. You are pregnant, may be pregnant, or are planning to become pregnant. If you drink alcohol: Limit how much you have to: 0-1 drink a day for women. 0-2 drinks a day for men. Know how much alcohol is in your drink. In the [place] one drink equals one 12 oz bottle of beer (355 mL), one 5 oz glass of wine (148 mL), or one 1 oz glass of hard liquor (44 mL). Keep yourself hydrated with water, diet soda, or unsweetened iced tea. Keep in mind that regular soda, juice, and other mixers may contain a lot of sugar and must be counted as carbs. What are tips for following this plan?  Reading food labels Start by checking the serving size on the  Nutrition Facts label of packaged foods and drinks. The number of calories and the amount of carbs, fats, and other nutrients listed on the label are based on one serving of the item. Many items contain more than one serving per package. Check the total grams (g) of carbs in one serving. Check the number of grams of saturated fats and trans fats in one serving. Choose foods that have a low amount or none of these fats. Check the number of milligrams (mg) of salt (sodium) in one serving. Most people should limit total sodium intake to less than 2,300 mg per day. Always check the nutrition information of foods labeled as "low-fat" or "nonfat." These foods may be higher in added sugar or refined carbs and should be avoided. Talk to your dietitian to identify your daily goals for nutrients listed on the label. Shopping Avoid buying canned, pre-made, or processed foods. These foods tend to be high in fat, sodium, and added sugar. Shop around the outside edge of the grocery store. This is where you will most often find fresh fruits and vegetables, bulk grains, fresh meats, and fresh dairy products. Cooking Use low-heat cooking methods, such as baking, instead of high-heat cooking methods, such as deep frying. Cook using healthy oils, such as olive, canola, or sunflower oil. Avoid cooking with butter, cream, or high-fat meats. Meal planning Eat meals and snacks regularly, preferably at the same times every day. Avoid going long periods of time without eating. Eat foods that are high in fiber, such as fresh fruits, vegetables, beans, and whole grains. Eat 4-6  oz (112-168 g) of lean protein each day, such as lean meat, chicken, fish, eggs, or tofu. One ounce (oz) (28 g) of lean protein is equal to: 1 oz (28 g) of meat, chicken, or fish. 1 egg.  cup (62 g) of tofu. Eat some foods each day that contain healthy fats, such as avocado, nuts, seeds, and fish. What foods should I eat? Fruits Berries. Apples.  Oranges. Peaches. Apricots. Plums. Grapes. Mangoes. Papayas. Pomegranates. Kiwi. Cherries. Vegetables Leafy greens, including lettuce, spinach, kale, chard, collard greens, mustard greens, and cabbage. Beets. Cauliflower. Broccoli. Carrots. Green beans. Tomatoes. Peppers. Onions. Cucumbers. Brussels sprouts. Grains Whole grains, such as whole-wheat or whole-grain bread, crackers, tortillas, cereal, and pasta. Unsweetened oatmeal. Quinoa. Brown or wild rice. Meats and other proteins Seafood. Poultry without skin. Lean cuts of poultry and beef. Tofu. Nuts. Seeds. Dairy Low-fat or fat-free dairy products such as milk, yogurt, and cheese. The items listed above may not be a complete list of foods and beverages you can eat and drink. Contact a dietitian for more information. What foods should I avoid? Fruits Fruits canned with syrup. Vegetables Canned vegetables. Frozen vegetables with butter or cream sauce. Grains Refined white flour and flour products such as bread, pasta, snack foods, and cereals. Avoid all processed foods. Meats and other proteins Fatty cuts of meat. Poultry with skin. Breaded or fried meats. Processed meat. Avoid saturated fats. Dairy Full-fat yogurt, cheese, or milk. Beverages Sweetened drinks, such as soda or iced tea. The items listed above may not be a complete list of foods and beverages you should avoid. Contact a dietitian for more information. Questions to ask a health care provider Do I need to meet with a certified diabetes care and education specialist? Do I need to meet with a dietitian? What number can I call if I have questions? When are the best times to check my blood glucose? Where to find more information: American Diabetes Association: diabetes.org Academy of Nutrition and Dietetics: eatright.[employer] of Diabetes and Digestive and Kidney Diseases: StageSync.si Association of Diabetes Care & Education Specialists:  diabeteseducator.org Summary It is important to have healthy eating habits because your blood sugar (glucose) levels are greatly affected by what you eat and drink. It is important to use alcohol carefully. A healthy meal plan will help you manage your blood glucose and lower your risk of heart disease. Your health care provider may recommend that you work with a dietitian to make a meal plan that is best for you. This information is not intended to replace advice given to you by your health care provider. Make sure you discuss any questions you have with your health care provider. Document Revised: 01/31/2020 Document Reviewed: 01/31/2020 Elsevier Patient Education  2024 Elsevier Inc.      Edwina Barth, MD [facility]

## 2023-05-13 NOTE — Assessment & Plan Note (Addendum)
Nonspecific chest pain.  Clinically stable.  No red flag signs or symptoms. Patient however has multiple cardiac risk factors such as diabetes, hypertension, dyslipidemia and former smoker. Needs cardiac workup. Recommend to start with cardiac CTA May need cardiology evaluation Recommend to start daily baby aspirin until cardiac workup is completed

## 2023-05-13 NOTE — Patient Instructions (Signed)

## 2023-05-13 NOTE — Assessment & Plan Note (Signed)
Chronic stable conditions Continue atorvastatin 40 mg daily. The 10-year ASCVD risk score (Arnett DK, et al., 2019) is: 16.6%   Values used to calculate the score:     Age: 47 years     Sex: Male     Is Non-Hispanic African American: Yes     Diabetic: Yes     Tobacco smoker: No     Systolic Blood Pressure: 130 mmHg     Is BP treated: Yes     HDL Cholesterol: 55.1 mg/dL     Total Cholesterol: 269 mg/dL

## 2023-05-13 NOTE — Assessment & Plan Note (Signed)
Well-controlled hypertension Continue Zestoretic 20-12.5 mg Hemoglobin A1c is 7.1, not at goal. Cardiovascular risks related to hypertension and diabetes discussed. Continue Synjardy 12.11-998 mg daily Diet and nutrition discussed

## 2023-05-28 ENCOUNTER — Other Ambulatory Visit (HOSPITAL_COMMUNITY): Payer: Self-pay

## 2023-05-28 ENCOUNTER — Other Ambulatory Visit: Payer: Self-pay | Admitting: Internal Medicine

## 2023-05-28 MED ORDER — LISINOPRIL-HYDROCHLOROTHIAZIDE 20-12.5 MG PO TABS
1.0000 | ORAL_TABLET | Freq: Every day | ORAL | 1 refills | Status: DC
  Filled 2023-05-28 – 2023-06-09 (×2): qty 90, 90d supply, fill #0
  Filled 2023-09-11: qty 30, 30d supply, fill #1
  Filled 2023-09-14: qty 30, 30d supply, fill #0
  Filled 2023-10-12: qty 30, 30d supply, fill #1

## 2023-06-09 ENCOUNTER — Other Ambulatory Visit (HOSPITAL_COMMUNITY): Payer: Self-pay

## 2023-06-12 ENCOUNTER — Ambulatory Visit
Admission: EM | Admit: 2023-06-12 | Discharge: 2023-06-12 | Disposition: A | Discharge status: Home / Self Care | Payer: 59 | Attending: Emergency Medicine | Admitting: Emergency Medicine

## 2023-06-12 ENCOUNTER — Other Ambulatory Visit (HOSPITAL_COMMUNITY): Payer: Self-pay

## 2023-06-12 ENCOUNTER — Other Ambulatory Visit: Payer: Self-pay

## 2023-06-12 DIAGNOSIS — J069 Acute upper respiratory infection, unspecified: Secondary | ICD-10-CM

## 2023-06-12 MED ORDER — GUAIFENESIN ER 600 MG PO TB12
1200.0000 mg | ORAL_TABLET | Freq: Two times a day (BID) | ORAL | 0 refills | Status: DC
  Filled 2023-06-12: qty 20, 5d supply, fill #0

## 2023-06-12 MED ORDER — AMOXICILLIN-POT CLAVULANATE 875-125 MG PO TABS
1.0000 | ORAL_TABLET | Freq: Two times a day (BID) | ORAL | 0 refills | Status: AC
  Filled 2023-06-12 – 2023-06-15 (×2): qty 10, 5d supply, fill #0

## 2023-06-12 MED ORDER — FLUTICASONE PROPIONATE 50 MCG/ACT NA SUSP
2.0000 | Freq: Every day | NASAL | 2 refills | Status: DC
  Filled 2023-06-12: qty 16, 30d supply, fill #0

## 2023-06-12 MED ORDER — CETIRIZINE HCL 10 MG PO TABS
10.0000 mg | ORAL_TABLET | Freq: Every day | ORAL | 2 refills | Status: AC
  Filled 2023-06-12: qty 30, 30d supply, fill #0

## 2023-06-12 NOTE — ED Triage Notes (Signed)
Pt reports sinus pressure, eye pressure and water in ears x 3 days. Pt has not taken any meds for complaints.

## 2023-06-12 NOTE — Discharge Instructions (Signed)
Please take the guaifenesin twice daily with lots of fluids This is to decrease congestion in the nose and chest It only works if you are very hydrated.   The nasal spray and zyrtec will help with the throat drainage  Please use these medications consistently for the next 3-4 days. If no change, you can fill the paper prescription for the antibiotic at any pharmacy

## 2023-06-12 NOTE — ED Provider Notes (Addendum)
[facility]    CSN: [id number] Arrival date & time: 06/12/23  1660      History   Chief Complaint Chief Complaint  Patient presents with   Nasal Congestion    HPI Adam Wiley is a 47 y.o. male.  3 day history of nasal congestion, runny nose, sinus pressure, and cough. Fatigue and ear pressure. No fever but felt warm Has not attempted intervention Negative covid test at home No known sick contacts  Past Medical History:  Diagnosis Date   Diabetes mellitus type 2 in nonobese (HCC) 03/07/2020   Diabetes mellitus without complication (HCC)    Exertional dyspnea 03/07/2020   Hypertension    Pure hypercholesterolemia 03/07/2020   Tobacco abuse 03/07/2020    Patient Active Problem List   Diagnosis Date Noted   Chest pain 05/13/2023   Hypertension associated with diabetes (HCC) 02/10/2023   Dyslipidemia associated with type 2 diabetes mellitus (HCC) 02/10/2023   Muscle strain of upper back 02/10/2023   Pure hypercholesterolemia 03/07/2020   Tobacco abuse 03/07/2020   Uncontrolled type 2 diabetes mellitus with hyperglycemia (HCC) 06/28/2018   Essential hypertension, benign 06/28/2018    History reviewed. No pertinent surgical history.     Home Medications    Prior to Admission medications   Medication Sig Start Date End Date Taking? Authorizing Provider  amoxicillin-clavulanate (AUGMENTIN) 875-125 MG tablet Take 1 tablet by mouth every 12 (twelve) hours for 5 days. 06/15/23 06/20/23 Yes Drew Lips, Lurena Joiner, PA-C  cetirizine (ZYRTEC ALLERGY) 10 MG tablet Take 1 tablet (10 mg total) by mouth daily. 06/12/23  Yes Cari Burgo, PA-C  fluticasone (FLONASE) 50 MCG/ACT nasal spray Place 2 sprays into both nostrils daily. 06/12/23  Yes Maxmilian Trostel, Lurena Joiner, PA-C  guaiFENesin (MUCINEX) 600 MG 12 hr tablet Take 2 tablets (1,200 mg total) by mouth 2 (two) times daily. 06/12/23  Yes Paymon Rosensteel, Lurena Joiner, PA-C  atorvastatin (LIPITOR) 40 MG tablet Take 1 tablet (40 mg) by mouth  daily. 02/18/23   Georgina Quint, MD  Empagliflozin-metFORMIN HCl ER (SYNJARDY XR) 12.11-998 MG TB24 Take 1 tablet by mouth daily. 12/03/22   Arnette Felts, FNP  esomeprazole (NEXIUM) 40 MG capsule TAKE 1 CAPSULE BY MOUTH DAILY 12/14/22 12/14/23  Dorothyann Peng, MD  lisinopril-hydrochlorothiazide (ZESTORETIC) 20-12.5 MG tablet Take 1 tablet by mouth daily. 05/28/23   Dorothyann Peng, MD    Family History Family History  Problem Relation Age of Onset   Diabetes Mother    Diabetes Father    High blood pressure Other    Hypertension Brother    Hypertension Sister    Diabetes Sister     Social History Social History   Tobacco Use   Smoking status: Former    Current packs/day: 0.00    Average packs/day: 0.3 packs/day for 5.0 years (1.3 ttl pk-yrs)    Types: Cigarettes    Start date: 2017    Quit date: 2022    Years since quitting: 2.9   Smokeless tobacco: Never  Vaping Use   Vaping status: Never Used  Substance Use Topics   Alcohol use: Yes    Comment: occasionally   Drug use: No     Allergies   Patient has no known allergies.   Review of Systems Review of Systems   Physical Exam Triage Vital Signs ED Triage Vitals  Encounter Vitals Group     BP 06/12/23 1114 (!) 154/96     Systolic BP Percentile --      Diastolic BP Percentile --  Pulse Rate 06/12/23 1114 83     Resp 06/12/23 1114 16     Temp 06/12/23 1114 99.1 F (37.3 C)     Temp Source 06/12/23 1114 Oral     SpO2 06/12/23 1114 97 %     Weight --      Height --      Head Circumference --      Peak Flow --      Pain Score 06/12/23 1117 0     Pain Loc --      Pain Education --      Exclude from Growth Chart --    No data found.  Updated Vital Signs BP (!) 154/96 (BP Location: Left Arm) Comment: Pt has not taken her meds today.  Pulse 83   Temp 99.1 F (37.3 C) (Oral)   Resp 16   SpO2 97%     Physical Exam Vitals and nursing note reviewed.  Constitutional:      General: He is not in  acute distress.    Appearance: He is not ill-appearing.  HENT:     Right Ear: Tympanic membrane and ear canal normal.     Left Ear: Tympanic membrane and ear canal normal.     Nose: Congestion present. No rhinorrhea.     Mouth/Throat:     Mouth: Mucous membranes are moist.     Pharynx: Oropharynx is clear. No posterior oropharyngeal erythema.  Eyes:     Conjunctiva/sclera: Conjunctivae normal.  Cardiovascular:     Rate and Rhythm: Normal rate and regular rhythm.     Pulses: Normal pulses.     Heart sounds: Normal heart sounds.  Pulmonary:     Effort: Pulmonary effort is normal. No respiratory distress.     Breath sounds: Normal breath sounds. No wheezing or rales.     Comments: Lungs clear throughout  Musculoskeletal:     Cervical back: Normal range of motion.  Lymphadenopathy:     Cervical: No cervical adenopathy.  Skin:    General: Skin is warm and dry.  Neurological:     Mental Status: He is alert and oriented to person, place, and time.     UC Treatments / Results  Labs (all labs ordered are listed, but only abnormal results are displayed) Labs Reviewed - No data to display  EKG  Radiology No results found.  Procedures Procedures  Medications Ordered in UC Medications - No data to display  Initial Impression / Assessment and Plan / UC Course  I have reviewed the triage vital signs and the nursing notes.  Pertinent labs & imaging results that were available during my care of the patient were reviewed by me and considered in my medical decision making (see chart for details).  Afebrile in clinic.  98.7 on recheck. Discussed viral etiology, 3 days into symptoms and has not yet attempted any intervention. Patient wants antibiotic and states he needs something to feel better right away. Discussed prognosis of virus and that it may take some time before he is improved. I advised using over-the-counter medications and nasal decongestants and monitoring symptoms.   Patient is again requesting an antibiotic.  Will provide a delayed prescription at this time, Augmentin twice daily for 5 days, to fill after 3 days if no change.  Again advised only to use if no change with symptomatic care. Declines work note today, no questions at this time  Final Clinical Impressions(s) / UC Diagnoses   Final diagnoses:  Viral URI with cough  Discharge Instructions      Please take the guaifenesin twice daily with lots of fluids This is to decrease congestion in the nose and chest It only works if you are very hydrated.   The nasal spray and zyrtec will help with the throat drainage  Please use these medications consistently for the next 3-4 days. If no change, you can fill the paper prescription for the antibiotic at any pharmacy    ED Prescriptions     Medication Sig Dispense Auth. Provider   guaiFENesin (MUCINEX) 600 MG 12 hr tablet Take 2 tablets (1,200 mg total) by mouth 2 (two) times daily. 20 tablet Chessica Audia, PA-C   fluticasone (FLONASE) 50 MCG/ACT nasal spray Place 2 sprays into both nostrils daily. 16 g Tannor Pyon, PA-C   cetirizine (ZYRTEC ALLERGY) 10 MG tablet Take 1 tablet (10 mg total) by mouth daily. 30 tablet Maylon Sailors, PA-C   amoxicillin-clavulanate (AUGMENTIN) 875-125 MG tablet Take 1 tablet by mouth every 12 (twelve) hours for 5 days. 10 tablet Alixandra Alfieri, Lurena Joiner, PA-C      PDMP not reviewed this encounter.      Everlina Gotts, Lurena Joiner, [place] 06/12/23 1215

## 2023-06-15 ENCOUNTER — Other Ambulatory Visit (HOSPITAL_COMMUNITY): Payer: Self-pay

## 2023-06-18 ENCOUNTER — Other Ambulatory Visit (HOSPITAL_COMMUNITY): Payer: Self-pay

## 2023-06-21 ENCOUNTER — Other Ambulatory Visit: Payer: Self-pay

## 2023-06-21 ENCOUNTER — Other Ambulatory Visit (HOSPITAL_COMMUNITY): Payer: Self-pay

## 2023-06-29 ENCOUNTER — Other Ambulatory Visit: Payer: Self-pay | Admitting: Radiology

## 2023-06-29 DIAGNOSIS — R079 Chest pain, unspecified: Secondary | ICD-10-CM

## 2023-07-09 ENCOUNTER — Other Ambulatory Visit (HOSPITAL_COMMUNITY): Payer: Self-pay

## 2023-07-09 ENCOUNTER — Telehealth (HOSPITAL_COMMUNITY): Payer: Self-pay | Admitting: Emergency Medicine

## 2023-07-09 DIAGNOSIS — R079 Chest pain, unspecified: Secondary | ICD-10-CM

## 2023-07-09 MED ORDER — METOPROLOL TARTRATE 100 MG PO TABS
100.0000 mg | ORAL_TABLET | Freq: Once | ORAL | 0 refills | Status: DC
  Filled 2023-07-09 – 2023-07-12 (×2): qty 1, 1d supply, fill #0

## 2023-07-09 NOTE — Telephone Encounter (Signed)
Reaching out to patient to offer assistance regarding upcoming cardiac imaging study; pt verbalizes understanding of appt date/time, parking situation and where to check in, pre-test NPO status and medications ordered, and verified current allergies; name and call back number provided for further questions should they arise Cayne Yom RN Navigator Cardiac Imaging [facility] [phone] office [phone] cell 

## 2023-07-12 ENCOUNTER — Other Ambulatory Visit (HOSPITAL_COMMUNITY): Payer: Self-pay

## 2023-07-12 ENCOUNTER — Telehealth: Payer: Self-pay | Admitting: Emergency Medicine

## 2023-07-12 ENCOUNTER — Telehealth: Payer: Self-pay

## 2023-07-12 NOTE — Telephone Encounter (Signed)
Copied from CRM [id number]. Topic: General - Other >> Jul 12, 2023 10:40 AM Sim Boast F wrote: Reason for CRM: Patient called to follow up on paperwork for his job - would like a call today to go over A1C numbers before paperwork is sent off.

## 2023-07-12 NOTE — Telephone Encounter (Signed)
Patient dropped off document Clearance for Diabetes, to be filled out by provider. Patient requested to send it back via Fax within ASAP. Document is located in providers tray at front office.Please advise at Mobile [phone] (mobile)   Patient would like to see and receive a copy of the paperwork before it is faxed off.

## 2023-07-13 ENCOUNTER — Ambulatory Visit (HOSPITAL_BASED_OUTPATIENT_CLINIC_OR_DEPARTMENT_OTHER)
Admission: RE | Admit: 2023-07-13 | Discharge: 2023-07-13 | Disposition: A | Discharge status: Home / Self Care | Payer: 59 | Source: Ambulatory Visit | Attending: Emergency Medicine | Admitting: Emergency Medicine

## 2023-07-13 ENCOUNTER — Encounter: Payer: Self-pay | Admitting: Emergency Medicine

## 2023-07-13 ENCOUNTER — Encounter (HOSPITAL_BASED_OUTPATIENT_CLINIC_OR_DEPARTMENT_OTHER): Payer: Self-pay

## 2023-07-13 DIAGNOSIS — R079 Chest pain, unspecified: Secondary | ICD-10-CM | POA: Diagnosis not present

## 2023-07-13 LAB — POCT I-STAT CREATININE: Creatinine, Ser: 1.1 mg/dL (ref 0.61–1.24)

## 2023-07-13 MED ORDER — METOPROLOL TARTRATE 5 MG/5ML IV SOLN: 10.0000 mg | Freq: Once | INTRAVENOUS | Status: DC | PRN

## 2023-07-13 MED ORDER — IOHEXOL 350 MG/ML SOLN
100.0000 mL | Freq: Once | INTRAVENOUS | Status: AC | PRN
  Administered 2023-07-13: 95 mL via INTRAVENOUS

## 2023-07-13 MED ORDER — DILTIAZEM HCL 25 MG/5ML IV SOLN: 10.0000 mg | INTRAVENOUS | Status: DC | PRN

## 2023-07-13 MED ORDER — NITROGLYCERIN 0.4 MG SL SUBL
0.8000 mg | SUBLINGUAL_TABLET | Freq: Once | SUBLINGUAL | Status: AC
  Administered 2023-07-13: 0.8 mg via SUBLINGUAL

## 2023-07-13 NOTE — Telephone Encounter (Signed)
Forms has been Rec'd and patients last A1C was in 02/17/23

## 2023-07-15 ENCOUNTER — Encounter: Payer: Self-pay | Admitting: Radiology

## 2023-07-15 NOTE — Telephone Encounter (Signed)
 Patient has appointment tomorrow for Nurse visit for A1c check. OKAY per Provider

## 2023-07-15 NOTE — Telephone Encounter (Signed)
 Patient would like to get A1C check for forms, can this be sent to DOD for orders to be put in and forms to be signed.

## 2023-07-15 NOTE — Telephone Encounter (Signed)
 Pt requesting call back

## 2023-07-15 NOTE — Telephone Encounter (Signed)
 Okay for nurse visit to check A1c.  Thanks.

## 2023-07-16 ENCOUNTER — Ambulatory Visit (INDEPENDENT_AMBULATORY_CARE_PROVIDER_SITE_OTHER): Payer: 59

## 2023-07-16 DIAGNOSIS — E1165 Type 2 diabetes mellitus with hyperglycemia: Secondary | ICD-10-CM

## 2023-07-16 LAB — POCT GLYCOSYLATED HEMOGLOBIN (HGB A1C)
HbA1c POC (<> result, manual entry): 6.9 % (ref 4.0–5.6)
HbA1c, POC (controlled diabetic range): 6.9 % (ref 0.0–7.0)
HbA1c, POC (prediabetic range): 6.9 % — AB (ref 5.7–6.4)
Hemoglobin A1C: 6.9 % — AB (ref 4.0–5.6)

## 2023-07-16 NOTE — Progress Notes (Signed)
 Patient seen in office today and POC A1C completed and ordered by Dr.Sagardia.  A1C was 6.9 and patient informed of results before leaving building.

## 2023-07-16 NOTE — Progress Notes (Signed)
 Stable A1c. Please forward to ordering provider as well. Thanks.

## 2023-08-02 ENCOUNTER — Encounter: Payer: Self-pay | Admitting: Emergency Medicine

## 2023-08-02 NOTE — Telephone Encounter (Signed)
Needs follow-up office visit with me

## 2023-08-09 ENCOUNTER — Encounter: Payer: Self-pay | Admitting: Emergency Medicine

## 2023-08-09 ENCOUNTER — Other Ambulatory Visit (HOSPITAL_COMMUNITY): Payer: Self-pay

## 2023-08-09 ENCOUNTER — Ambulatory Visit (INDEPENDENT_AMBULATORY_CARE_PROVIDER_SITE_OTHER): Payer: 59 | Admitting: Emergency Medicine

## 2023-08-09 VITALS — BP 138/90 | HR 76 | Temp 99.0°F | Ht 72.0 in | Wt 179.0 lb

## 2023-08-09 DIAGNOSIS — E1169 Type 2 diabetes mellitus with other specified complication: Secondary | ICD-10-CM

## 2023-08-09 DIAGNOSIS — E1165 Type 2 diabetes mellitus with hyperglycemia: Secondary | ICD-10-CM

## 2023-08-09 DIAGNOSIS — L989 Disorder of the skin and subcutaneous tissue, unspecified: Secondary | ICD-10-CM

## 2023-08-09 DIAGNOSIS — E785 Hyperlipidemia, unspecified: Secondary | ICD-10-CM

## 2023-08-09 DIAGNOSIS — I152 Hypertension secondary to endocrine disorders: Secondary | ICD-10-CM

## 2023-08-09 DIAGNOSIS — E1159 Type 2 diabetes mellitus with other circulatory complications: Secondary | ICD-10-CM | POA: Diagnosis not present

## 2023-08-09 LAB — POCT GLYCOSYLATED HEMOGLOBIN (HGB A1C): Hemoglobin A1C: 7.1 % — AB (ref 4.0–5.6)

## 2023-08-09 MED ORDER — CLOTRIMAZOLE-BETAMETHASONE 1-0.05 % EX CREA
1.0000 | TOPICAL_CREAM | Freq: Every day | CUTANEOUS | 0 refills | Status: AC
  Filled 2023-08-09: qty 30, 30d supply, fill #0

## 2023-08-09 NOTE — Assessment & Plan Note (Signed)
Well-controlled hypertension Continue Zestoretic 20-12.5 mg Hemoglobin A1c is 7.1, not at goal. Cardiovascular risks related to hypertension and diabetes discussed. Stopped Synjardy 12.11-998 mg 2 weeks ago and started taking supplement called sugar defender.  Wants to continue this regimen for couple of months and see how much he changes his A1c We will follow-up in 3 months. Diet and nutrition discussed

## 2023-08-09 NOTE — Progress Notes (Signed)
Adam Wiley 48 y.o.   Chief Complaint  Patient presents with   Follow-up    3 month f/u for DM. Patient states having a bug bite on his left leg and wants to know if he needs to see dermatologist     HISTORY OF PRESENT ILLNESS: This is a 48 y.o. male A1A here for 76-month follow-up of chronic medical conditions including hypertension, diabetes, and dyslipidemia During last office visit he was complaining of nonspecific chest pain. Was able to get CT coronary morphology test last December which was normal with no extracardiac findings. Started taking sugar defender supplement 2 weeks ago instead of diabetes medications.  Wants to give it a try along with nutrition. Also what may have been bug bite to left lower leg.  Treated with iodine.  Still present. Has no other complaints or medical concerns today.  HPI   Prior to Admission medications   Medication Sig Start Date End Date Taking? Authorizing Provider  atorvastatin (LIPITOR) 40 MG tablet Take 1 tablet (40 mg) by mouth daily. 02/18/23  Yes Leala Bryand, Eilleen Kempf, MD  cetirizine (ZYRTEC ALLERGY) 10 MG tablet Take 1 tablet (10 mg total) by mouth daily. 06/12/23  Yes Rising, Lurena Joiner, PA-C  Empagliflozin-metFORMIN HCl ER (SYNJARDY XR) 12.11-998 MG TB24 Take 1 tablet by mouth daily. 12/03/22  Yes Arnette Felts, FNP  esomeprazole (NEXIUM) 40 MG capsule TAKE 1 CAPSULE BY MOUTH DAILY 12/14/22 12/14/23 Yes Dorothyann Peng, MD  fluticasone [facility]) 50 MCG/ACT nasal spray Place 2 sprays into both nostrils daily. 06/12/23  Yes Rising, Lurena Joiner, PA-C  guaiFENesin (MUCINEX) 600 MG 12 hr tablet Take 2 tablets (1,200 mg total) by mouth 2 (two) times daily. 06/12/23  Yes Rising, Lurena Joiner, PA-C  lisinopril-hydrochlorothiazide (ZESTORETIC) 20-12.5 MG tablet Take 1 tablet by mouth daily. 05/28/23  Yes Dorothyann Peng, MD  metoprolol tartrate (LOPRESSOR) 100 MG tablet Take 1 tablet (100 mg total) by mouth once for 1 dose. Please take one time dose 100mg  metoprolol  tartrate 2 hr prior to cardiac CT for HR control IF HR >55bpm. 07/09/23 07/13/23  O'Neal, Ronnald Ramp, MD    No Known Allergies  Patient Active Problem List   Diagnosis Date Noted   Chest pain 05/13/2023   Hypertension associated with diabetes (HCC) 02/10/2023   Dyslipidemia associated with type 2 diabetes mellitus (HCC) 02/10/2023   Muscle strain of upper back 02/10/2023   Pure hypercholesterolemia 03/07/2020   Tobacco abuse 03/07/2020   Uncontrolled type 2 diabetes mellitus with hyperglycemia (HCC) 06/28/2018   Essential hypertension, benign 06/28/2018    Past Medical History:  Diagnosis Date   Diabetes mellitus type 2 in nonobese (HCC) 03/07/2020   Diabetes mellitus without complication (HCC)    Exertional dyspnea 03/07/2020   Hypertension    Pure hypercholesterolemia 03/07/2020   Tobacco abuse 03/07/2020    History reviewed. No pertinent surgical history.  Social History   Socioeconomic History   Marital status: Married    Spouse name: Not on file   Number of children: Not on file   Years of education: Not on file   Highest education level: 12th grade  Occupational History   Not on file  Tobacco Use   Smoking status: Former    Current packs/day: 0.00    Average packs/day: 0.3 packs/day for 5.0 years (1.3 ttl pk-yrs)    Types: Cigarettes    Start date: 2017    Quit date: 2022    Years since quitting: 3.0   Smokeless tobacco: Never  Vaping Use  Vaping status: Never Used  Substance and Sexual Activity   Alcohol use: Yes    Comment: occasionally   Drug use: No   Sexual activity: Yes    Birth control/protection: None  Other Topics Concern   Not on file  Social History Narrative   Not on file   Social Drivers of Health   Financial Resource Strain: Low Risk  (08/05/2023)   Overall Financial Resource Strain (CARDIA)    Difficulty of Paying Living Expenses: Not hard at all  Food Insecurity: No Food Insecurity (08/05/2023)   Hunger Vital Sign    Worried  About Running Out of Food in the Last Year: Never true    Ran Out of Food in the Last Year: Never true  Transportation Needs: No Transportation Needs (08/05/2023)   PRAPARE - Administrator, Civil Service (Medical): No    Lack of Transportation (Non-Medical): No  Physical Activity: Sufficiently Active (08/05/2023)   Exercise Vital Sign    Days of Exercise per Week: 5 days    Minutes of Exercise per Session: 30 min  Recent Concern: Physical Activity - Insufficiently Active (05/09/2023)   Exercise Vital Sign    Days of Exercise per Week: 3 days    Minutes of Exercise per Session: 30 min  Stress: No Stress Concern Present (08/05/2023)   [employer] of Occupational Health - Occupational Stress Questionnaire    Feeling of Stress : Not at all  Social Connections: Socially Integrated (08/05/2023)   Social Connection and Isolation Panel [NHANES]    Frequency of Communication with Friends and Family: Once a week    Frequency of Social Gatherings with Friends and Family: Twice a week    Attends Religious Services: More than 4 times per year    Active Member of [employer] or Organizations: Yes    Attends Engineer, structural: More than 4 times per year    Marital Status: Married  Catering manager Violence: Unknown (10/13/2021)   Received from [employer], [facility]   HITS    Physically Hurt: Not on file    Insult or Talk Down To: Not on file    Threaten Physical Harm: Not on file    Scream or Curse: Not on file    Family History  Problem Relation Age of Onset   Diabetes Mother    Diabetes Father    High blood pressure Other    Hypertension Brother    Hypertension Sister    Diabetes Sister      Review of Systems  Constitutional: Negative.  Negative for chills and fever.  HENT: Negative.  Negative for congestion and sore throat.   Cardiovascular: Negative.  Negative for chest pain and palpitations.  Gastrointestinal:  Negative for abdominal pain, nausea and  vomiting.  Genitourinary: Negative.  Negative for dysuria and hematuria.  Skin: Negative.  Negative for rash.  Neurological: Negative.  Negative for dizziness and headaches.  All other systems reviewed and are negative.   Vitals:   08/09/23 0816  BP: (!) 138/90  Pulse: 76  Temp: 99 F (37.2 C)  SpO2: 98%    Physical Exam Vitals reviewed.  Constitutional:      Appearance: Normal appearance.  HENT:     Head: Normocephalic.  Eyes:     Extraocular Movements: Extraocular movements intact.  Cardiovascular:     Rate and Rhythm: Normal rate.  Pulmonary:     Effort: Pulmonary effort is normal.  Musculoskeletal:  General: Normal range of motion.  Skin:    General: Skin is warm and dry.     Comments: Left lower leg: Flat hyperpigmented round lesion about the size of a nickel  Neurological:     Mental Status: He is alert and oriented to person, place, and time.  Psychiatric:        Mood and Affect: Mood normal.        Behavior: Behavior normal.    Results for orders placed or performed in visit on 08/09/23 (from the past 24 hours)  POCT HgB A1C     Status: Abnormal   Collection Time: 08/09/23  8:27 AM  Result Value Ref Range   Hemoglobin A1C 7.1 (A) 4.0 - 5.6 %   HbA1c POC (<> result, manual entry)     HbA1c, POC (prediabetic range)     HbA1c, POC (controlled diabetic range)       ASSESSMENT & PLAN: A total of 45 minutes was spent with the patient and counseling/coordination of care regarding preparing for this visit, review of most recent office visit notes, review of multiple chronic medical conditions and their management, cardiovascular risks associated with diabetes and hypertension, review of all medications, review of most recent bloodwork results including interpretation of today's hemoglobin A1c, review of health maintenance items, education on nutrition, prognosis, documentation, and need for follow up.   Problem List Items Addressed This Visit        Cardiovascular and Mediastinum   Hypertension associated with diabetes (HCC) - Primary   Well-controlled hypertension Continue Zestoretic 20-12.5 mg Hemoglobin A1c is 7.1, not at goal. Cardiovascular risks related to hypertension and diabetes discussed. Stopped Synjardy 12.11-998 mg 2 weeks ago and started taking supplement called sugar defender.  Wants to continue this regimen for couple of months and see how much he changes his A1c We will follow-up in 3 months. Diet and nutrition discussed        Endocrine   Uncontrolled type 2 diabetes mellitus with hyperglycemia (HCC)   Relevant Orders   POCT HgB A1C (Completed)   Dyslipidemia associated with type 2 diabetes mellitus (HCC)   Chronic stable conditions Continue atorvastatin 40 mg daily. The 10-year ASCVD risk score (Arnett DK, et al., 2019) is: 18.5%   Values used to calculate the score:     Age: 38 years     Sex: Male     Is Non-Hispanic African American: Yes     Diabetic: Yes     Tobacco smoker: No     Systolic Blood Pressure: 138 mmHg     Is BP treated: Yes     HDL Cholesterol: 55.1 mg/dL     Total Cholesterol: 269 mg/dL       Other Visit Diagnoses       Skin lesion       Relevant Medications   clotrimazole-betamethasone (LOTRISONE) cream          Patient Instructions  Diabetes Mellitus and Nutrition, Adult When you have diabetes, or diabetes mellitus, it is very important to have healthy eating habits because your blood sugar (glucose) levels are greatly affected by what you eat and drink. Eating healthy foods in the right amounts, at about the same times every day, can help you: Manage your blood glucose. Lower your risk of heart disease. Improve your blood pressure. Reach or maintain a healthy weight. What can affect my meal plan? Every person with diabetes is different, and each person has different needs for a meal plan. Your  health care provider may recommend that you work with a dietitian to make a  meal plan that is best for you. Your meal plan may vary depending on factors such as: The calories you need. The medicines you take. Your weight. Your blood glucose, blood pressure, and cholesterol levels. Your activity level. Other health conditions you have, such as heart or kidney disease. How do carbohydrates affect me? Carbohydrates, also called carbs, affect your blood glucose level more than any other type of food. Eating carbs raises the amount of glucose in your blood. It is important to know how many carbs you can safely have in each meal. This is different for every person. Your dietitian can help you calculate how many carbs you should have at each meal and for each snack. How does alcohol affect me? Alcohol can cause a decrease in blood glucose (hypoglycemia), especially if you use insulin or take certain diabetes medicines by mouth. Hypoglycemia can be a life-threatening condition. Symptoms of hypoglycemia, such as sleepiness, dizziness, and confusion, are similar to symptoms of having too much alcohol. Do not drink alcohol if: Your health care provider tells you not to drink. You are pregnant, may be pregnant, or are planning to become pregnant. If you drink alcohol: Limit how much you have to: 0-1 drink a day for women. 0-2 drinks a day for men. Know how much alcohol is in your drink. In the [place] one drink equals one 12 oz bottle of beer (355 mL), one 5 oz glass of wine (148 mL), or one 1 oz glass of hard liquor (44 mL). Keep yourself hydrated with water, diet soda, or unsweetened iced tea. Keep in mind that regular soda, juice, and other mixers may contain a lot of sugar and must be counted as carbs. What are tips for following this plan?  Reading food labels Start by checking the serving size on the Nutrition Facts label of packaged foods and drinks. The number of calories and the amount of carbs, fats, and other nutrients listed on the label are based on one serving of  the item. Many items contain more than one serving per package. Check the total grams (g) of carbs in one serving. Check the number of grams of saturated fats and trans fats in one serving. Choose foods that have a low amount or none of these fats. Check the number of milligrams (mg) of salt (sodium) in one serving. Most people should limit total sodium intake to less than 2,300 mg per day. Always check the nutrition information of foods labeled as "low-fat" or "nonfat." These foods may be higher in added sugar or refined carbs and should be avoided. Talk to your dietitian to identify your daily goals for nutrients listed on the label. Shopping Avoid buying canned, pre-made, or processed foods. These foods tend to be high in fat, sodium, and added sugar. Shop around the outside edge of the grocery store. This is where you will most often find fresh fruits and vegetables, bulk grains, fresh meats, and fresh dairy products. Cooking Use low-heat cooking methods, such as baking, instead of high-heat cooking methods, such as deep frying. Cook using healthy oils, such as olive, canola, or sunflower oil. Avoid cooking with butter, cream, or high-fat meats. Meal planning Eat meals and snacks regularly, preferably at the same times every day. Avoid going long periods of time without eating. Eat foods that are high in fiber, such as fresh fruits, vegetables, beans, and whole grains. Eat 4-6 oz (112-168 g)  of lean protein each day, such as lean meat, chicken, fish, eggs, or tofu. One ounce (oz) (28 g) of lean protein is equal to: 1 oz (28 g) of meat, chicken, or fish. 1 egg.  cup (62 g) of tofu. Eat some foods each day that contain healthy fats, such as avocado, nuts, seeds, and fish. What foods should I eat? Fruits Berries. Apples. Oranges. Peaches. Apricots. Plums. Grapes. Mangoes. Papayas. Pomegranates. Kiwi. Cherries. Vegetables Leafy greens, including lettuce, spinach, kale, chard, collard  greens, mustard greens, and cabbage. Beets. Cauliflower. Broccoli. Carrots. Green beans. Tomatoes. Peppers. Onions. Cucumbers. Brussels sprouts. Grains Whole grains, such as whole-wheat or whole-grain bread, crackers, tortillas, cereal, and pasta. Unsweetened oatmeal. Quinoa. Brown or wild rice. Meats and other proteins Seafood. Poultry without skin. Lean cuts of poultry and beef. Tofu. Nuts. Seeds. Dairy Low-fat or fat-free dairy products such as milk, yogurt, and cheese. The items listed above may not be a complete list of foods and beverages you can eat and drink. Contact a dietitian for more information. What foods should I avoid? Fruits Fruits canned with syrup. Vegetables Canned vegetables. Frozen vegetables with butter or cream sauce. Grains Refined white flour and flour products such as bread, pasta, snack foods, and cereals. Avoid all processed foods. Meats and other proteins Fatty cuts of meat. Poultry with skin. Breaded or fried meats. Processed meat. Avoid saturated fats. Dairy Full-fat yogurt, cheese, or milk. Beverages Sweetened drinks, such as soda or iced tea. The items listed above may not be a complete list of foods and beverages you should avoid. Contact a dietitian for more information. Questions to ask a health care provider Do I need to meet with a certified diabetes care and education specialist? Do I need to meet with a dietitian? What number can I call if I have questions? When are the best times to check my blood glucose? Where to find more information: American Diabetes Association: diabetes.org Academy of Nutrition and Dietetics: eatright.[employer] of Diabetes and Digestive and Kidney Diseases: StageSync.si Association of Diabetes Care & Education Specialists: diabeteseducator.org Summary It is important to have healthy eating habits because your blood sugar (glucose) levels are greatly affected by what you eat and drink. It is important to  use alcohol carefully. A healthy meal plan will help you manage your blood glucose and lower your risk of heart disease. Your health care provider may recommend that you work with a dietitian to make a meal plan that is best for you. This information is not intended to replace advice given to you by your health care provider. Make sure you discuss any questions you have with your health care provider. Document Revised: 01/30/2020 Document Reviewed: 01/31/2020 Elsevier Patient Education  2024 Elsevier Inc.    Edwina Barth, MD [facility]

## 2023-08-09 NOTE — Patient Instructions (Signed)

## 2023-08-09 NOTE — Assessment & Plan Note (Signed)
Chronic stable conditions Continue atorvastatin 40 mg daily. The 10-year ASCVD risk score (Arnett DK, et al., 2019) is: 18.5%   Values used to calculate the score:     Age: 48 years     Sex: Male     Is Non-Hispanic African American: Yes     Diabetic: Yes     Tobacco smoker: No     Systolic Blood Pressure: 138 mmHg     Is BP treated: Yes     HDL Cholesterol: 55.1 mg/dL     Total Cholesterol: 269 mg/dL

## 2023-08-16 ENCOUNTER — Ambulatory Visit: Payer: 59 | Admitting: Emergency Medicine

## 2023-09-13 ENCOUNTER — Other Ambulatory Visit: Payer: Self-pay

## 2023-09-13 ENCOUNTER — Other Ambulatory Visit (HOSPITAL_COMMUNITY): Payer: Self-pay

## 2023-09-14 ENCOUNTER — Other Ambulatory Visit (HOSPITAL_COMMUNITY): Payer: Self-pay

## 2023-09-21 ENCOUNTER — Encounter: Payer: Self-pay | Admitting: Emergency Medicine

## 2023-09-21 ENCOUNTER — Other Ambulatory Visit: Payer: Self-pay | Admitting: Radiology

## 2023-09-21 DIAGNOSIS — L989 Disorder of the skin and subcutaneous tissue, unspecified: Secondary | ICD-10-CM

## 2023-09-21 NOTE — Telephone Encounter (Signed)
 Provide referral as requested.  Thanks.

## 2023-09-28 ENCOUNTER — Other Ambulatory Visit: Payer: Self-pay | Admitting: Emergency Medicine

## 2023-09-28 ENCOUNTER — Other Ambulatory Visit (HOSPITAL_COMMUNITY): Payer: Self-pay

## 2023-09-28 MED ORDER — ATORVASTATIN CALCIUM 40 MG PO TABS
40.0000 mg | ORAL_TABLET | Freq: Every day | ORAL | 3 refills | Status: DC
  Filled 2023-09-28: qty 30, 30d supply, fill #0

## 2023-10-11 ENCOUNTER — Other Ambulatory Visit (HOSPITAL_COMMUNITY): Payer: Self-pay

## 2023-10-11 MED ORDER — HALOBETASOL PROPIONATE 0.05 % EX CREA
TOPICAL_CREAM | CUTANEOUS | 3 refills | Status: DC
  Filled 2023-10-11: qty 50, 30d supply, fill #0

## 2023-10-12 ENCOUNTER — Other Ambulatory Visit (HOSPITAL_COMMUNITY): Payer: Self-pay

## 2023-10-18 ENCOUNTER — Other Ambulatory Visit: Payer: Self-pay | Admitting: Nurse Practitioner

## 2023-10-19 ENCOUNTER — Other Ambulatory Visit (HOSPITAL_COMMUNITY): Payer: Self-pay

## 2023-10-19 MED ORDER — SYNJARDY XR 12.5-1000 MG PO TB24
1.0000 | ORAL_TABLET | Freq: Every day | ORAL | 2 refills | Status: DC
  Filled 2023-10-19 – 2023-10-20 (×3): qty 30, 30d supply, fill #0

## 2023-10-20 ENCOUNTER — Other Ambulatory Visit: Payer: Self-pay | Admitting: Nurse Practitioner

## 2023-10-20 ENCOUNTER — Other Ambulatory Visit: Payer: Self-pay | Admitting: Radiology

## 2023-10-20 ENCOUNTER — Other Ambulatory Visit (HOSPITAL_COMMUNITY): Payer: Self-pay

## 2023-10-20 ENCOUNTER — Encounter: Payer: Self-pay | Admitting: Emergency Medicine

## 2023-10-20 DIAGNOSIS — E1165 Type 2 diabetes mellitus with hyperglycemia: Secondary | ICD-10-CM

## 2023-10-20 MED ORDER — SYNJARDY XR 12.5-1000 MG PO TB24: 1.0000 | ORAL_TABLET | Freq: Every day | ORAL | 2 refills | Status: DC

## 2023-10-21 ENCOUNTER — Encounter: Payer: Self-pay | Admitting: Emergency Medicine

## 2023-10-21 ENCOUNTER — Other Ambulatory Visit (HOSPITAL_COMMUNITY): Payer: Self-pay

## 2023-10-22 ENCOUNTER — Encounter (HOSPITAL_COMMUNITY): Payer: Self-pay

## 2023-10-22 ENCOUNTER — Other Ambulatory Visit (HOSPITAL_COMMUNITY): Payer: Self-pay

## 2023-11-08 ENCOUNTER — Ambulatory Visit: Payer: 59 | Admitting: Emergency Medicine

## 2023-11-17 ENCOUNTER — Ambulatory Visit: Admitting: Emergency Medicine

## 2023-11-17 ENCOUNTER — Encounter: Payer: Self-pay | Admitting: Emergency Medicine

## 2023-11-17 VITALS — BP 136/80 | HR 83 | Temp 98.3°F | Ht 72.0 in | Wt 169.1 lb

## 2023-11-17 DIAGNOSIS — E1165 Type 2 diabetes mellitus with hyperglycemia: Secondary | ICD-10-CM | POA: Diagnosis not present

## 2023-11-17 DIAGNOSIS — I152 Hypertension secondary to endocrine disorders: Secondary | ICD-10-CM

## 2023-11-17 DIAGNOSIS — E1169 Type 2 diabetes mellitus with other specified complication: Secondary | ICD-10-CM | POA: Diagnosis not present

## 2023-11-17 DIAGNOSIS — E1159 Type 2 diabetes mellitus with other circulatory complications: Secondary | ICD-10-CM

## 2023-11-17 DIAGNOSIS — E785 Hyperlipidemia, unspecified: Secondary | ICD-10-CM | POA: Diagnosis not present

## 2023-11-17 DIAGNOSIS — Z7984 Long term (current) use of oral hypoglycemic drugs: Secondary | ICD-10-CM

## 2023-11-17 LAB — POCT GLYCOSYLATED HEMOGLOBIN (HGB A1C): HbA1c POC (<> result, manual entry): 6.9 % (ref 4.0–5.6)

## 2023-11-17 MED ORDER — METFORMIN HCL 500 MG PO TABS: 500.0000 mg | ORAL_TABLET | Freq: Two times a day (BID) | ORAL | 3 refills | Status: DC

## 2023-11-17 NOTE — Progress Notes (Signed)
 Adam Wiley 48 y.o.   No chief complaint on file.   HISTORY OF PRESENT ILLNESS: This is a 48 y.o. male here for 51-month follow-up of diabetes Doing well.  Eating better and exercising more. Still taking Synjardy  once a day No other complaints or medical concerns today.  HPI   Prior to Admission medications   Medication Sig Start Date End Date Taking? Authorizing Provider  atorvastatin  (LIPITOR) 40 MG tablet Take 1 tablet (40 mg) by mouth daily. 09/28/23  Yes Brooklynne Pereida, Isidro Margo, MD  cetirizine  (ZYRTEC  ALLERGY) 10 MG tablet Take 1 tablet (10 mg total) by mouth daily. 06/12/23  Yes Rising, Rebecca, PA-C  clotrimazole -betamethasone  (LOTRISONE ) cream Apply 1 Application topically daily. 08/09/23  Yes Elvira Hammersmith, MD  esomeprazole  (NEXIUM ) 40 MG capsule TAKE 1 CAPSULE BY MOUTH DAILY 12/14/22 12/14/23 Yes Cleave Curling, MD  fluticasone  (FLONASE ) 50 MCG/ACT nasal spray Place 2 sprays into both nostrils daily. 06/12/23  Yes Rising, Ivette Marks, PA-C  guaiFENesin  (MUCINEX ) 600 MG 12 hr tablet Take 2 tablets (1,200 mg total) by mouth 2 (two) times daily. 06/12/23  Yes Rising, Ivette Marks, PA-C  halobetasol  (ULTRAVATE ) 0.05 % cream Apply topically twice daily as needed. Not to face, groin or underarms 10/11/23  Yes Denman Fischer, MD  lisinopril -hydrochlorothiazide  (ZESTORETIC ) 20-12.5 MG tablet Take 1 tablet by mouth daily. 05/28/23  Yes Cleave Curling, MD  metFORMIN  (GLUCOPHAGE ) 500 MG tablet Take 1 tablet (500 mg total) by mouth 2 (two) times daily with a meal. 11/17/23  Yes Fidel Caggiano, Isidro Margo, MD  metoprolol  tartrate (LOPRESSOR ) 100 MG tablet Take 1 tablet (100 mg total) by mouth once for 1 dose. Please take one time dose 100mg  metoprolol  tartrate 2 hr prior to cardiac CT for HR control IF HR >55bpm. 07/09/23 07/13/23  O'Neal, Cathay Clonts, MD    No Known Allergies  Patient Active Problem List   Diagnosis Date Noted   Hypertension associated with diabetes (HCC) 02/10/2023   Dyslipidemia  associated with type 2 diabetes mellitus (HCC) 02/10/2023   Pure hypercholesterolemia 03/07/2020   Uncontrolled type 2 diabetes mellitus with hyperglycemia (HCC) 06/28/2018   Essential hypertension, benign 06/28/2018    Past Medical History:  Diagnosis Date   Diabetes mellitus type 2 in nonobese (HCC) 03/07/2020   Diabetes mellitus without complication (HCC)    Exertional dyspnea 03/07/2020   Hypertension    Pure hypercholesterolemia 03/07/2020   Tobacco abuse 03/07/2020    History reviewed. No pertinent surgical history.  Social History   Socioeconomic History   Marital status: Married    Spouse name: Not on file   Number of children: Not on file   Years of education: Not on file   Highest education level: 12th grade  Occupational History   Not on file  Tobacco Use   Smoking status: Former    Current packs/day: 0.00    Average packs/day: 0.3 packs/day for 5.0 years (1.3 ttl pk-yrs)    Types: Cigarettes    Start date: 2017    Quit date: 2022    Years since quitting: 3.3   Smokeless tobacco: Never  Vaping Use   Vaping status: Never Used  Substance and Sexual Activity   Alcohol use: Yes    Comment: occasionally   Drug use: No   Sexual activity: Yes    Birth control/protection: None  Other Topics Concern   Not on file  Social History Narrative   Not on file   Social Drivers of Health   Financial Resource Strain: Low Risk  (  08/05/2023)   Overall Financial Resource Strain (CARDIA)    Difficulty of Paying Living Expenses: Not hard at all  Food Insecurity: No Food Insecurity (08/05/2023)   Hunger Vital Sign    Worried About Running Out of Food in the Last Year: Never true    Ran Out of Food in the Last Year: Never true  Transportation Needs: No Transportation Needs (08/05/2023)   PRAPARE - Administrator, Civil Service (Medical): No    Lack of Transportation (Non-Medical): No  Physical Activity: Sufficiently Active (08/05/2023)   Exercise Vital Sign     Days of Exercise per Week: 5 days    Minutes of Exercise per Session: 30 min  Recent Concern: Physical Activity - Insufficiently Active (05/09/2023)   Exercise Vital Sign    Days of Exercise per Week: 3 days    Minutes of Exercise per Session: 30 min  Stress: No Stress Concern Present (08/05/2023)   [employer] of Occupational Health - Occupational Stress Questionnaire    Feeling of Stress : Not at all  Social Connections: Socially Integrated (08/05/2023)   Social Connection and Isolation Panel [NHANES]    Frequency of Communication with Friends and Family: Once a week    Frequency of Social Gatherings with Friends and Family: Twice a week    Attends Religious Services: More than 4 times per year    Active Member of [employer] or Organizations: Yes    Attends Engineer, structural: More than 4 times per year    Marital Status: Married  Catering manager Violence: Unknown (10/13/2021)   Received from [employer], [facility]   HITS    Physically Hurt: Not on file    Insult or Talk Down To: Not on file    Threaten Physical Harm: Not on file    Scream or Curse: Not on file    Family History  Problem Relation Age of Onset   Diabetes Mother    Diabetes Father    High blood pressure Other    Hypertension Brother    Hypertension Sister    Diabetes Sister      Review of Systems  Constitutional: Negative.  Negative for chills and fever.  HENT: Negative.  Negative for congestion and sore throat.   Respiratory: Negative.  Negative for cough.   Cardiovascular: Negative.  Negative for chest pain and palpitations.  Gastrointestinal:  Negative for abdominal pain, nausea and vomiting.  Genitourinary: Negative.  Negative for dysuria and hematuria.  Skin: Negative.  Negative for rash.  Neurological: Negative.  Negative for dizziness and headaches.  All other systems reviewed and are negative.   Vitals:   11/17/23 0816  BP: 136/80  Pulse: 83  Temp: 98.3 F (36.8 C)  SpO2:  99%    Physical Exam Vitals reviewed.  Constitutional:      Appearance: Normal appearance.  HENT:     Head: Normocephalic.     Mouth/Throat:     Mouth: Mucous membranes are moist.     Pharynx: Oropharynx is clear.  Eyes:     Extraocular Movements: Extraocular movements intact.     Pupils: Pupils are equal, round, and reactive to light.  Cardiovascular:     Rate and Rhythm: Normal rate and regular rhythm.     Pulses: Normal pulses.     Heart sounds: Normal heart sounds.  Pulmonary:     Effort: Pulmonary effort is normal.     Breath sounds: Normal breath sounds.  Abdominal:  Palpations: Abdomen is soft.     Tenderness: There is no abdominal tenderness.  Musculoskeletal:     Cervical back: No tenderness.  Lymphadenopathy:     Cervical: No cervical adenopathy.  Skin:    General: Skin is warm and dry.     Capillary Refill: Capillary refill takes less than 2 seconds.  Neurological:     General: No focal deficit present.     Mental Status: He is alert and oriented to person, place, and time.  Psychiatric:        Mood and Affect: Mood normal.        Behavior: Behavior normal.    Results for orders placed or performed in visit on 11/17/23 (from the past 24 hours)  POCT HgB A1C     Status: None   Collection Time: 11/17/23  8:55 AM  Result Value Ref Range   Hemoglobin A1C     HbA1c POC (<> result, manual entry) 6.9 4.0 - 5.6 %   HbA1c, POC (prediabetic range)     HbA1c, POC (controlled diabetic range)       ASSESSMENT & PLAN: A total of 42 minutes was spent with the patient and counseling/coordination of care regarding preparing for this visit, review of most recent office visit notes, review of multiple chronic medical conditions and their management, cardiovascular risks associated with hypertension and diabetes, review of most recent cardiac CT scan report, review of all medications and changes made, review of most recent bloodwork results including interpretation of  today's hemoglobin A1c, review of health maintenance items, education on nutrition, prognosis, documentation, and need for follow up.   Problem List Items Addressed This Visit       Cardiovascular and Mediastinum   Hypertension associated with diabetes (HCC) - Primary   Well-controlled hypertension Continue Zestoretic  20-12.5 mg Hemoglobin A1c is 6.9 better than before. Cardiovascular risks related to hypertension and diabetes discussed. Has been taking Synjardy  once a day intermittently but is getting too expensive.  His goal is to get off all medications Recommend to stop Synjardy  and take metformin  500 mg twice a day only. We will follow-up in 3 months. Diet and nutrition discussed      Relevant Medications   metFORMIN  (GLUCOPHAGE ) 500 MG tablet     Endocrine   Uncontrolled type 2 diabetes mellitus with hyperglycemia (HCC)   Relevant Medications   metFORMIN  (GLUCOPHAGE ) 500 MG tablet   Other Relevant Orders   POCT HgB A1C (Completed)   Dyslipidemia associated with type 2 diabetes mellitus (HCC)   Chronic stable conditions Continue atorvastatin  40 mg daily.      Relevant Medications   metFORMIN  (GLUCOPHAGE ) 500 MG tablet   Other Relevant Orders   POCT HgB A1C (Completed)   Patient Instructions  Health Maintenance, Male Adopting a healthy lifestyle and getting preventive care are important in promoting health and wellness. Ask your health care provider about: The right schedule for you to have regular tests and exams. Things you can do on your own to prevent diseases and keep yourself healthy. What should I know about diet, weight, and exercise? Eat a healthy diet  Eat a diet that includes plenty of vegetables, fruits, low-fat dairy products, and lean protein. Do not eat a lot of foods that are high in solid fats, added sugars, or sodium. Maintain a healthy weight Body mass index (BMI) is a measurement that can be used to identify possible weight problems. It  estimates body fat based on height and weight. Your health  care provider can help determine your BMI and help you achieve or maintain a healthy weight. Get regular exercise Get regular exercise. This is one of the most important things you can do for your health. Most adults should: Exercise for at least 150 minutes each week. The exercise should increase your heart rate and make you sweat (moderate-intensity exercise). Do strengthening exercises at least twice a week. This is in addition to the moderate-intensity exercise. Spend less time sitting. Even light physical activity can be beneficial. Watch cholesterol and blood lipids Have your blood tested for lipids and cholesterol at 50 years of age, then have this test every 5 years. You may need to have your cholesterol levels checked more often if: Your lipid or cholesterol levels are high. You are older than 50 years of age. You are at high risk for heart disease. What should I know about cancer screening? Many types of cancers can be detected early and may often be prevented. Depending on your health history and family history, you may need to have cancer screening at various ages. This may include screening for: Colorectal cancer. Prostate cancer. Skin cancer. Lung cancer. What should I know about heart disease, diabetes, and high blood pressure? Blood pressure and heart disease High blood pressure causes heart disease and increases the risk of stroke. This is more likely to develop in people who have high blood pressure readings or are overweight. Talk with your health care provider about your target blood pressure readings. Have your blood pressure checked: Every 3-5 years if you are 67-47 years of age. Every year if you are 37 years old or older. If you are between the ages of 25 and 31 and are a current or former smoker, ask your health care provider if you should have a one-time screening for abdominal aortic aneurysm  (AAA). Diabetes Have regular diabetes screenings. This checks your fasting blood sugar level. Have the screening done: Once every three years after age 70 if you are at a normal weight and have a low risk for diabetes. More often and at a younger age if you are overweight or have a high risk for diabetes. What should I know about preventing infection? Hepatitis B If you have a higher risk for hepatitis B, you should be screened for this virus. Talk with your health care provider to find out if you are at risk for hepatitis B infection. Hepatitis C Blood testing is recommended for: Everyone born from [birth year] through 1967. Anyone with known risk factors for hepatitis C. Sexually transmitted infections (STIs) You should be screened each year for STIs, including gonorrhea and chlamydia, if: You are sexually active and are younger than 50 years of age. You are older than 50 years of age and your health care provider tells you that you are at risk for this type of infection. Your sexual activity has changed since you were last screened, and you are at increased risk for chlamydia or gonorrhea. Ask your health care provider if you are at risk. Ask your health care provider about whether you are at high risk for HIV. Your health care provider may recommend a prescription medicine to help prevent HIV infection. If you choose to take medicine to prevent HIV, you should first get tested for HIV. You should then be tested every 3 months for as long as you are taking the medicine. Follow these instructions at home: Alcohol use Do not drink alcohol if your health care provider tells you not to  drink. If you drink alcohol: Limit how much you have to 0-2 drinks a day. Know how much alcohol is in your drink. In the [place] one drink equals one 12 oz bottle of beer (355 mL), one 5 oz glass of wine (148 mL), or one 1 oz glass of hard liquor (44 mL). Lifestyle Do not use any products that contain nicotine or  tobacco. These products include cigarettes, chewing tobacco, and vaping devices, such as e-cigarettes. If you need help quitting, ask your health care provider. Do not use street drugs. Do not share needles. Ask your health care provider for help if you need support or information about quitting drugs. General instructions Schedule regular health, dental, and eye exams. Stay current with your vaccines. Tell your health care provider if: You often feel depressed. You have ever been abused or do not feel safe at home. Summary Adopting a healthy lifestyle and getting preventive care are important in promoting health and wellness. Follow your health care provider's instructions about healthy diet, exercising, and getting tested or screened for diseases. Follow your health care provider's instructions on monitoring your cholesterol and blood pressure. This information is not intended to replace advice given to you by your health care provider. Make sure you discuss any questions you have with your health care provider. Document Revised: 11/18/2020 Document Reviewed: 11/18/2020 Elsevier Patient Education  2024 Elsevier Inc.       Maryagnes Small, MD [facility]

## 2023-11-17 NOTE — Assessment & Plan Note (Signed)
 Well-controlled hypertension Continue Zestoretic  20-12.5 mg Hemoglobin A1c is 6.9 better than before. Cardiovascular risks related to hypertension and diabetes discussed. Has been taking Synjardy  once a day intermittently but is getting too expensive.  His goal is to get off all medications Recommend to stop Synjardy  and take metformin  500 mg twice a day only. We will follow-up in 3 months. Diet and nutrition discussed

## 2023-11-17 NOTE — Patient Instructions (Signed)
 Health Maintenance, Male  Adopting a healthy lifestyle and getting preventive care are important in promoting health and wellness. Ask your health care provider about:  The right schedule for you to have regular tests and exams.  Things you can do on your own to prevent diseases and keep yourself healthy.  What should I know about diet, weight, and exercise?  Eat a healthy diet    Eat a diet that includes plenty of vegetables, fruits, low-fat dairy products, and lean protein.  Do not eat a lot of foods that are high in solid fats, added sugars, or sodium.  Maintain a healthy weight  Body mass index (BMI) is a measurement that can be used to identify possible weight problems. It estimates body fat based on height and weight. Your health care provider can help determine your BMI and help you achieve or maintain a healthy weight.  Get regular exercise  Get regular exercise. This is one of the most important things you can do for your health. Most adults should:  Exercise for at least 150 minutes each week. The exercise should increase your heart rate and make you sweat (moderate-intensity exercise).  Do strengthening exercises at least twice a week. This is in addition to the moderate-intensity exercise.  Spend less time sitting. Even light physical activity can be beneficial.  Watch cholesterol and blood lipids  Have your blood tested for lipids and cholesterol at 50 years of age, then have this test every 5 years.  You may need to have your cholesterol levels checked more often if:  Your lipid or cholesterol levels are high.  You are older than 50 years of age.  You are at high risk for heart disease.  What should I know about cancer screening?  Many types of cancers can be detected early and may often be prevented. Depending on your health history and family history, you may need to have cancer screening at various ages. This may include screening for:  Colorectal cancer.  Prostate cancer.  Skin cancer.  Lung  cancer.  What should I know about heart disease, diabetes, and high blood pressure?  Blood pressure and heart disease  High blood pressure causes heart disease and increases the risk of stroke. This is more likely to develop in people who have high blood pressure readings or are overweight.  Talk with your health care provider about your target blood pressure readings.  Have your blood pressure checked:  Every 3-5 years if you are 50-95 years of age.  Every year if you are 50 years old or older.  If you are between the ages of 29 and 29 and are a current or former smoker, ask your health care provider if you should have a one-time screening for abdominal aortic aneurysm (AAA).  Diabetes  Have regular diabetes screenings. This checks your fasting blood sugar level. Have the screening done:  Once every three years after age 23 if you are at a normal weight and have a low risk for diabetes.  More often and at a younger age if you are overweight or have a high risk for diabetes.  What should I know about preventing infection?  Hepatitis B  If you have a higher risk for hepatitis B, you should be screened for this virus. Talk with your health care provider to find out if you are at risk for hepatitis B infection.  Hepatitis C  Blood testing is recommended for:  Everyone born from [birth year] through 1967.  Anyone  with known risk factors for hepatitis C.  Sexually transmitted infections (STIs)  You should be screened each year for STIs, including gonorrhea and chlamydia, if:  You are sexually active and are younger than 50 years of age.  You are older than 50 years of age and your health care provider tells you that you are at risk for this type of infection.  Your sexual activity has changed since you were last screened, and you are at increased risk for chlamydia or gonorrhea. Ask your health care provider if you are at risk.  Ask your health care provider about whether you are at high risk for HIV. Your health care provider  may recommend a prescription medicine to help prevent HIV infection. If you choose to take medicine to prevent HIV, you should first get tested for HIV. You should then be tested every 3 months for as long as you are taking the medicine.  Follow these instructions at home:  Alcohol use  Do not drink alcohol if your health care provider tells you not to drink.  If you drink alcohol:  Limit how much you have to 0-2 drinks a day.  Know how much alcohol is in your drink. In the [place] one drink equals one 12 oz bottle of beer (355 mL), one 5 oz glass of wine (148 mL), or one 1 oz glass of hard liquor (44 mL).  Lifestyle  Do not use any products that contain nicotine or tobacco. These products include cigarettes, chewing tobacco, and vaping devices, such as e-cigarettes. If you need help quitting, ask your health care provider.  Do not use street drugs.  Do not share needles.  Ask your health care provider for help if you need support or information about quitting drugs.  General instructions  Schedule regular health, dental, and eye exams.  Stay current with your vaccines.  Tell your health care provider if:  You often feel depressed.  You have ever been abused or do not feel safe at home.  Summary  Adopting a healthy lifestyle and getting preventive care are important in promoting health and wellness.  Follow your health care provider's instructions about healthy diet, exercising, and getting tested or screened for diseases.  Follow your health care provider's instructions on monitoring your cholesterol and blood pressure.  This information is not intended to replace advice given to you by your health care provider. Make sure you discuss any questions you have with your health care provider.  Document Revised: 11/18/2020 Document Reviewed: 11/18/2020  Elsevier Patient Education  2024 [employer].

## 2023-11-17 NOTE — Assessment & Plan Note (Signed)
Chronic stable conditions Continue atorvastatin 40 mg daily

## 2023-12-16 ENCOUNTER — Encounter: Payer: Self-pay | Admitting: Emergency Medicine

## 2023-12-16 ENCOUNTER — Other Ambulatory Visit: Payer: Self-pay | Admitting: Emergency Medicine

## 2023-12-16 ENCOUNTER — Other Ambulatory Visit (HOSPITAL_COMMUNITY): Payer: Self-pay

## 2023-12-16 MED ORDER — LISINOPRIL-HYDROCHLOROTHIAZIDE 20-12.5 MG PO TABS
1.0000 | ORAL_TABLET | Freq: Every day | ORAL | 3 refills | Status: DC
  Filled 2023-12-16: qty 90, 90d supply, fill #0

## 2023-12-16 NOTE — Telephone Encounter (Signed)
 Yes

## 2023-12-17 ENCOUNTER — Other Ambulatory Visit (HOSPITAL_COMMUNITY): Payer: Self-pay

## 2024-02-08 ENCOUNTER — Other Ambulatory Visit (HOSPITAL_COMMUNITY): Payer: Self-pay

## 2024-02-08 ENCOUNTER — Other Ambulatory Visit (HOSPITAL_BASED_OUTPATIENT_CLINIC_OR_DEPARTMENT_OTHER): Payer: Self-pay

## 2024-02-08 ENCOUNTER — Encounter: Payer: Self-pay | Admitting: Emergency Medicine

## 2024-02-08 ENCOUNTER — Ambulatory Visit: Payer: Self-pay | Admitting: Emergency Medicine

## 2024-02-08 VITALS — BP 138/94 | HR 77 | Temp 98.2°F | Ht 72.0 in | Wt 176.0 lb

## 2024-02-08 DIAGNOSIS — M26609 Unspecified temporomandibular joint disorder, unspecified side: Secondary | ICD-10-CM

## 2024-02-08 LAB — CBC WITH DIFFERENTIAL/PLATELET
Basophils Absolute: 0 K/uL (ref 0.0–0.1)
Basophils Relative: 0.3 % (ref 0.0–3.0)
Eosinophils Absolute: 0.1 K/uL (ref 0.0–0.7)
Eosinophils Relative: 1.7 % (ref 0.0–5.0)
HCT: 43.1 % (ref 39.0–52.0)
Hemoglobin: 14.2 g/dL (ref 13.0–17.0)
Lymphocytes Relative: 30.6 % (ref 12.0–46.0)
Lymphs Abs: 2.6 K/uL (ref 0.7–4.0)
MCHC: 32.8 g/dL (ref 30.0–36.0)
MCV: 88.5 fl (ref 78.0–100.0)
Monocytes Absolute: 0.6 K/uL (ref 0.1–1.0)
Monocytes Relative: 6.5 % (ref 3.0–12.0)
Neutro Abs: 5.2 K/uL (ref 1.4–7.7)
Neutrophils Relative %: 60.9 % (ref 43.0–77.0)
Platelets: 283 K/uL (ref 150.0–400.0)
RBC: 4.87 Mil/uL (ref 4.22–5.81)
RDW: 14.6 % (ref 11.5–15.5)
WBC: 8.5 K/uL (ref 4.0–10.5)

## 2024-02-08 LAB — COMPREHENSIVE METABOLIC PANEL WITH GFR
ALT: 37 U/L (ref 0–53)
AST: 21 U/L (ref 0–37)
Albumin: 4.8 g/dL (ref 3.5–5.2)
Alkaline Phosphatase: 78 U/L (ref 39–117)
BUN: 16 mg/dL (ref 6–23)
CO2: 29 meq/L (ref 19–32)
Calcium: 10.1 mg/dL (ref 8.4–10.5)
Chloride: 102 meq/L (ref 96–112)
Creatinine, Ser: 1.06 mg/dL (ref 0.40–1.50)
GFR: 81.85 mL/min (ref >60.00)
Glucose, Bld: 150 mg/dL — ABNORMAL HIGH (ref 70–99)
Potassium: 4 meq/L (ref 3.5–5.1)
Sodium: 139 meq/L (ref 135–145)
Total Bilirubin: 0.5 mg/dL (ref 0.2–1.2)
Total Protein: 7.2 g/dL (ref 6.0–8.3)

## 2024-02-08 LAB — SEDIMENTATION RATE: Sed Rate: 4 mm/h (ref 0–20)

## 2024-02-08 LAB — HEMOGLOBIN A1C: Hgb A1c MFr Bld: 8.2 % — ABNORMAL HIGH (ref 4.6–6.5)

## 2024-02-08 MED ORDER — METHYLPREDNISOLONE 4 MG PO TBPK
ORAL_TABLET | ORAL | 1 refills | Status: DC
  Filled 2024-02-08: qty 21, 6d supply, fill #0

## 2024-02-08 MED ORDER — TRAMADOL HCL 50 MG PO TABS
50.0000 mg | ORAL_TABLET | Freq: Three times a day (TID) | ORAL | 0 refills | Status: AC | PRN
  Filled 2024-02-08: qty 15, 5d supply, fill #0

## 2024-02-08 NOTE — Patient Instructions (Signed)
 Temporomandibular Joint Syndrome  Temporomandibular joint syndrome (TMJ syndrome) is a condition that causes pain in the temporomandibular joints. These joints are located near your ears and allow your jaw to open and close. For people with TMJ syndrome, chewing, biting, or other movements of the jaw can be difficult or painful. TMJ syndrome is often mild and goes away within a few weeks. However, sometimes the condition becomes a long-term (chronic) problem. What are the causes? This condition may be caused by: Grinding your teeth or clenching your jaw. Some people do this when they are stressed. Arthritis. An injury to the jaw. A head or neck injury. Teeth or dentures that are not aligned well. In some cases, the cause of TMJ syndrome may not be known. What are the signs or symptoms? The most common symptom of this condition is aching pain on the side of the head in the area of the TMJ. Other symptoms may include: Pain when moving your jaw, such as when chewing or biting. Not being able to open your jaw all the way. Making a clicking sound when you open your mouth. Headache. Earache. Neck or shoulder pain. How is this diagnosed? This condition may be diagnosed based on: Your symptoms and medical history. A physical exam. Your health care provider may check the range of motion of your jaw. Imaging tests, such as X-rays or an MRI. You may also need to see your dentist, who will check if your teeth and jaw are lined up correctly. How is this treated? TMJ syndrome often goes away on its own. If treatment is needed, it may include: Eating soft foods and applying ice or heat. Medicines to relieve pain or inflammation. Medicines or massage to relax the muscles. A splint, bite plate, or mouthpiece to prevent teeth grinding or jaw clenching. Relaxation techniques or counseling to help reduce stress. A therapy for pain in which an electrical current is applied to the nerves through the skin  (transcutaneous electrical nerve stimulation). Acupuncture. This may help to relieve pain. Jaw surgery. This is rarely needed. Follow these instructions at home:  Eating and drinking Eat a soft diet if you are having trouble chewing. Avoid foods that require a lot of chewing. Do not chew gum. General instructions Take over-the-counter and prescription medicines only as told by your health care provider. If directed, put ice on the painful area. To do this: Put ice in a plastic bag. Place a towel between your skin and the bag. Leave the ice on for 20 minutes, 2-3 times a day. Remove the ice if your skin turns bright red. This is very important. If you cannot feel pain, heat, or cold, you have a greater risk of damage to the area. Apply a warm, wet cloth (warm compress) to the painful area as told. Massage your jaw area and do any jaw stretching exercises as told by your health care provider. If you were given a splint, bite plate, or mouthpiece, wear it as told by your health care provider. Keep all follow-up visits. This is important. Where to find more information [employer]: [employer] Contact a health care provider if: You have trouble eating. You have new or worsening symptoms. Get help right away if: Your jaw locks. Summary Temporomandibular joint syndrome (TMJ syndrome) is a condition that causes pain in the temporomandibular joints. These joints are located near your ears and allow your jaw to open and close. TMJ syndrome is often mild and goes away within  a few weeks. However, sometimes the condition becomes a long-term (chronic) problem. Symptoms include an aching pain on the side of the head in the area of the TMJ, pain when chewing or biting, and being unable to open your jaw all the way. You may also make a clicking sound when you open your mouth. TMJ syndrome often goes away on its own. If treatment is needed, it may  include medicines to relieve pain, reduce inflammation, or relax the muscles. A splint, bite plate, or mouthpiece may also be used to prevent teeth grinding or jaw clenching. This information is not intended to replace advice given to you by your health care provider. Make sure you discuss any questions you have with your health care provider. Document Revised: 02/09/2021 Document Reviewed: 02/09/2021 Elsevier Patient Education  2024 [employer].

## 2024-02-08 NOTE — Progress Notes (Signed)
 Adam Wiley 48 y.o.   Chief Complaint  Patient presents with   Ear Pain    Patient here for right ear ache. That started 2 weeks ago. Patient mentions his balance is off, has facial swelling it is tender to touch, off taste in his mouth, some vision issues. He is not taking anything for the pain only allergy medication. He is also having lower jaw ache that comes and goes, states he doesn't have any bad teeth     HISTORY OF PRESENT ILLNESS: This is a 48 y.o. male complaining of pain to right TMJ area that started about 2 weeks ago Pain is sharp.  Radiates to right ear and right facial area.  At times gets blurry vision of taste in his mouth No other associated symptoms.  No other complaints or medical concerns today.  HPI   Prior to Admission medications   Medication Sig Start Date End Date Taking? Authorizing Provider  atorvastatin  (LIPITOR) 40 MG tablet Take 1 tablet (40 mg) by mouth daily. 09/28/23  Yes Jolyn Deshmukh, Emil Schanz, MD  cetirizine  (ZYRTEC  ALLERGY) 10 MG tablet Take 1 tablet (10 mg total) by mouth daily. 06/12/23  Yes Rising, Asberry, PA-C  clotrimazole -betamethasone  (LOTRISONE ) cream Apply 1 Application topically daily. 08/09/23  Yes Purcell Emil Schanz, MD  esomeprazole  (NEXIUM ) 40 MG capsule TAKE 1 CAPSULE BY MOUTH DAILY 12/14/22 02/08/24 Yes Jarold Medici, MD  fluticasone  (FLONASE ) 50 MCG/ACT nasal spray Place 2 sprays into both nostrils daily. 06/12/23  Yes Rising, Asberry, PA-C  guaiFENesin  (MUCINEX ) 600 MG 12 hr tablet Take 2 tablets (1,200 mg total) by mouth 2 (two) times daily. 06/12/23  Yes Rising, Asberry, PA-C  halobetasol  (ULTRAVATE ) 0.05 % cream Apply topically twice daily as needed. Not to face, groin or underarms 10/11/23  Yes Shona Rush, MD  lisinopril -hydrochlorothiazide  (ZESTORETIC ) 20-12.5 MG tablet Take 1 tablet by mouth daily. 12/16/23  Yes Gurnie Duris, Emil Schanz, MD  metFORMIN  (GLUCOPHAGE ) 500 MG tablet Take 1 tablet (500 mg total) by mouth 2 (two) times  daily with a meal. 11/17/23  Yes Cybele Maule, Emil Schanz, MD  metoprolol  tartrate (LOPRESSOR ) 100 MG tablet Take 1 tablet (100 mg total) by mouth once for 1 dose. Please take one time dose 100mg  metoprolol  tartrate 2 hr prior to cardiac CT for HR control IF HR >55bpm. 07/09/23 02/08/24 Yes O'Neal, Darryle Ned, MD    No Known Allergies  Patient Active Problem List   Diagnosis Date Noted   Hypertension associated with diabetes (HCC) 02/10/2023   Dyslipidemia associated with type 2 diabetes mellitus (HCC) 02/10/2023   Pure hypercholesterolemia 03/07/2020   Uncontrolled type 2 diabetes mellitus with hyperglycemia (HCC) 06/28/2018   Essential hypertension, benign 06/28/2018    Past Medical History:  Diagnosis Date   Diabetes mellitus type 2 in nonobese (HCC) 03/07/2020   Diabetes mellitus without complication (HCC)    Exertional dyspnea 03/07/2020   Hypertension    Pure hypercholesterolemia 03/07/2020   Tobacco abuse 03/07/2020    No past surgical history on file.  Social History   Socioeconomic History   Marital status: Married    Spouse name: Not on file   Number of children: Not on file   Years of education: Not on file   Highest education level: 12th grade  Occupational History   Not on file  Tobacco Use   Smoking status: Former    Current packs/day: 0.00    Average packs/day: 0.3 packs/day for 5.0 years (1.3 ttl pk-yrs)    Types: Cigarettes  Start date: 2017    Quit date: 2022    Years since quitting: 3.5   Smokeless tobacco: Never  Vaping Use   Vaping status: Never Used  Substance and Sexual Activity   Alcohol use: Yes    Comment: occasionally   Drug use: No   Sexual activity: Yes    Birth control/protection: None  Other Topics Concern   Not on file  Social History Narrative   Not on file   Social Drivers of Health   Financial Resource Strain: Low Risk  (08/05/2023)   Overall Financial Resource Strain (CARDIA)    Difficulty of Paying Living Expenses: Not hard  at all  Food Insecurity: No Food Insecurity (08/05/2023)   Hunger Vital Sign    Worried About Running Out of Food in the Last Year: Never true    Ran Out of Food in the Last Year: Never true  Transportation Needs: No Transportation Needs (08/05/2023)   PRAPARE - Administrator, Civil Service (Medical): No    Lack of Transportation (Non-Medical): No  Physical Activity: Sufficiently Active (08/05/2023)   Exercise Vital Sign    Days of Exercise per Week: 5 days    Minutes of Exercise per Session: 30 min  Recent Concern: Physical Activity - Insufficiently Active (05/09/2023)   Exercise Vital Sign    Days of Exercise per Week: 3 days    Minutes of Exercise per Session: 30 min  Stress: No Stress Concern Present (08/05/2023)   [employer] of Occupational Health - Occupational Stress Questionnaire    Feeling of Stress : Not at all  Social Connections: Socially Integrated (08/05/2023)   Social Connection and Isolation Panel    Frequency of Communication with Friends and Family: Once a week    Frequency of Social Gatherings with Friends and Family: Twice a week    Attends Religious Services: More than 4 times per year    Active Member of [employer] or Organizations: Yes    Attends Banker Meetings: More than 4 times per year    Marital Status: Married  Catering manager Violence: Unknown (10/13/2021)   Received from [facility]   HITS    Physically Hurt: Not on file    Insult or Talk Down To: Not on file    Threaten Physical Harm: Not on file    Scream or Curse: Not on file    Family History  Problem Relation Age of Onset   Diabetes Mother    Diabetes Father    High blood pressure Other    Hypertension Brother    Hypertension Sister    Diabetes Sister      Review of Systems  Constitutional: Negative.  Negative for chills and fever.  HENT: Negative.  Negative for congestion and sore throat.   Eyes:  Negative for blurred vision, double vision and pain.   Respiratory: Negative.  Negative for cough and shortness of breath.   Cardiovascular: Negative.  Negative for chest pain and palpitations.  Gastrointestinal:  Negative for abdominal pain, diarrhea, nausea and vomiting.  Genitourinary: Negative.  Negative for dysuria and hematuria.  Skin: Negative.  Negative for rash.  Neurological: Negative.  Negative for dizziness and headaches.  All other systems reviewed and are negative.   Vitals:   02/08/24 1404  BP: (!) 138/94  Pulse: 77  Temp: 98.2 F (36.8 C)  SpO2: 96%    Physical Exam Vitals reviewed.  Constitutional:      Appearance: Normal appearance.  HENT:  Head: Normocephalic.     Comments: Tender right TMJ    Right Ear: Tympanic membrane, ear canal and external ear normal.     Left Ear: Tympanic membrane, ear canal and external ear normal.     Mouth/Throat:     Mouth: Mucous membranes are moist.     Pharynx: Oropharynx is clear.  Eyes:     Extraocular Movements: Extraocular movements intact.     Conjunctiva/sclera: Conjunctivae normal.     Pupils: Pupils are equal, round, and reactive to light.  Cardiovascular:     Rate and Rhythm: Normal rate and regular rhythm.     Pulses: Normal pulses.     Heart sounds: Normal heart sounds.  Pulmonary:     Effort: Pulmonary effort is normal.     Breath sounds: Normal breath sounds.  Musculoskeletal:     Cervical back: No tenderness.  Lymphadenopathy:     Cervical: No cervical adenopathy.  Skin:    General: Skin is warm and dry.  Neurological:     General: No focal deficit present.     Mental Status: He is alert and oriented to person, place, and time.     Motor: No weakness.     ASSESSMENT & PLAN: A total of 35 minutes was spent with the patient and counseling/coordination of care regarding preparing for this visit, review of most recent office visit notes, review of all medications and chronic medical conditions, differential diagnosis for TMJ, pain and symptom  management, prognosis, documentation and need for follow-up.  Problem List Items Addressed This Visit       Musculoskeletal and Integument   TMJ (temporomandibular joint syndrome) - Primary   Clinically stable but active and affecting quality of life.  No red flag signs or symptoms. Tender right TMJ.  Most likely secondary to teeth grinding Stress contributing to some extent Relates increased stress over the last several weeks Recommend to start Medrol  Dosepak today Tylenol  for mild to moderate pain and tramadol  for moderate to severe pain Recommend blood work today including sedimentation rate. Differential diagnosis discussed.  Doubt vasculitic process.      Relevant Medications   methylPREDNISolone  (MEDROL  DOSEPAK) 4 MG TBPK tablet   traMADol  (ULTRAM ) 50 MG tablet   Other Relevant Orders   Hemoglobin A1c   Comprehensive metabolic panel with GFR   CBC with Differential/Platelet   Sedimentation rate   Patient Instructions  Temporomandibular Joint Syndrome  Temporomandibular joint syndrome (TMJ syndrome) is a condition that causes pain in the temporomandibular joints. These joints are located near your ears and allow your jaw to open and close. For people with TMJ syndrome, chewing, biting, or other movements of the jaw can be difficult or painful. TMJ syndrome is often mild and goes away within a few weeks. However, sometimes the condition becomes a long-term (chronic) problem. What are the causes? This condition may be caused by: Grinding your teeth or clenching your jaw. Some people do this when they are stressed. Arthritis. An injury to the jaw. A head or neck injury. Teeth or dentures that are not aligned well. In some cases, the cause of TMJ syndrome may not be known. What are the signs or symptoms? The most common symptom of this condition is aching pain on the side of the head in the area of the TMJ. Other symptoms may include: Pain when moving your jaw, such as when  chewing or biting. Not being able to open your jaw all the way. Making a clicking sound when  you open your mouth. Headache. Earache. Neck or shoulder pain. How is this diagnosed? This condition may be diagnosed based on: Your symptoms and medical history. A physical exam. Your health care provider may check the range of motion of your jaw. Imaging tests, such as X-rays or an MRI. You may also need to see your dentist, who will check if your teeth and jaw are lined up correctly. How is this treated? TMJ syndrome often goes away on its own. If treatment is needed, it may include: Eating soft foods and applying ice or heat. Medicines to relieve pain or inflammation. Medicines or massage to relax the muscles. A splint, bite plate, or mouthpiece to prevent teeth grinding or jaw clenching. Relaxation techniques or counseling to help reduce stress. A therapy for pain in which an electrical current is applied to the nerves through the skin (transcutaneous electrical nerve stimulation). Acupuncture. This may help to relieve pain. Jaw surgery. This is rarely needed. Follow these instructions at home:  Eating and drinking Eat a soft diet if you are having trouble chewing. Avoid foods that require a lot of chewing. Do not chew gum. General instructions Take over-the-counter and prescription medicines only as told by your health care provider. If directed, put ice on the painful area. To do this: Put ice in a plastic bag. Place a towel between your skin and the bag. Leave the ice on for 20 minutes, 2-3 times a day. Remove the ice if your skin turns bright red. This is very important. If you cannot feel pain, heat, or cold, you have a greater risk of damage to the area. Apply a warm, wet cloth (warm compress) to the painful area as told. Massage your jaw area and do any jaw stretching exercises as told by your health care provider. If you were given a splint, bite plate, or mouthpiece, wear it  as told by your health care provider. Keep all follow-up visits. This is important. Where to find more information [employer]: [employer] Contact a health care provider if: You have trouble eating. You have new or worsening symptoms. Get help right away if: Your jaw locks. Summary Temporomandibular joint syndrome (TMJ syndrome) is a condition that causes pain in the temporomandibular joints. These joints are located near your ears and allow your jaw to open and close. TMJ syndrome is often mild and goes away within a few weeks. However, sometimes the condition becomes a long-term (chronic) problem. Symptoms include an aching pain on the side of the head in the area of the TMJ, pain when chewing or biting, and being unable to open your jaw all the way. You may also make a clicking sound when you open your mouth. TMJ syndrome often goes away on its own. If treatment is needed, it may include medicines to relieve pain, reduce inflammation, or relax the muscles. A splint, bite plate, or mouthpiece may also be used to prevent teeth grinding or jaw clenching. This information is not intended to replace advice given to you by your health care provider. Make sure you discuss any questions you have with your health care provider. Document Revised: 02/09/2021 Document Reviewed: 02/09/2021 Elsevier Patient Education  2024 Elsevier Inc.    Emil Schaumann, MD [facility]

## 2024-02-08 NOTE — Assessment & Plan Note (Signed)
 Clinically stable but active and affecting quality of life.  No red flag signs or symptoms. Tender right TMJ.  Most likely secondary to teeth grinding Stress contributing to some extent Relates increased stress over the last several weeks Recommend to start Medrol  Dosepak today Tylenol  for mild to moderate pain and tramadol  for moderate to severe pain Recommend blood work today including sedimentation rate. Differential diagnosis discussed.  Doubt vasculitic process.

## 2024-02-09 ENCOUNTER — Ambulatory Visit: Payer: Self-pay | Admitting: Emergency Medicine

## 2024-02-10 NOTE — Telephone Encounter (Signed)
 Please clarify the message.  Pills of what?

## 2024-03-25 ENCOUNTER — Other Ambulatory Visit: Payer: Self-pay | Admitting: Internal Medicine

## 2024-03-26 ENCOUNTER — Encounter: Payer: Self-pay | Admitting: Emergency Medicine

## 2024-03-27 ENCOUNTER — Other Ambulatory Visit: Payer: Self-pay | Admitting: Radiology

## 2024-03-27 MED ORDER — LISINOPRIL-HYDROCHLOROTHIAZIDE 20-12.5 MG PO TABS: 1.0000 | ORAL_TABLET | Freq: Every day | ORAL | 3 refills | Status: AC

## 2024-03-28 ENCOUNTER — Encounter: Payer: Self-pay | Admitting: Emergency Medicine

## 2024-03-28 NOTE — Telephone Encounter (Signed)
 Needs office visit please.  Thanks.

## 2024-04-04 ENCOUNTER — Encounter: Payer: Self-pay | Admitting: Emergency Medicine

## 2024-04-04 ENCOUNTER — Ambulatory Visit (INDEPENDENT_AMBULATORY_CARE_PROVIDER_SITE_OTHER): Payer: Self-pay | Admitting: Emergency Medicine

## 2024-04-04 VITALS — BP 122/82 | HR 85 | Temp 98.2°F | Ht 72.0 in | Wt 173.0 lb

## 2024-04-04 DIAGNOSIS — E1169 Type 2 diabetes mellitus with other specified complication: Secondary | ICD-10-CM

## 2024-04-04 DIAGNOSIS — I152 Hypertension secondary to endocrine disorders: Secondary | ICD-10-CM

## 2024-04-04 DIAGNOSIS — H9201 Otalgia, right ear: Secondary | ICD-10-CM | POA: Insufficient documentation

## 2024-04-04 DIAGNOSIS — Z7984 Long term (current) use of oral hypoglycemic drugs: Secondary | ICD-10-CM

## 2024-04-04 DIAGNOSIS — E1159 Type 2 diabetes mellitus with other circulatory complications: Secondary | ICD-10-CM

## 2024-04-04 DIAGNOSIS — E785 Hyperlipidemia, unspecified: Secondary | ICD-10-CM

## 2024-04-04 MED ORDER — GLIPIZIDE 5 MG PO TABS: 5.0000 mg | ORAL_TABLET | Freq: Two times a day (BID) | ORAL | 3 refills | Status: DC

## 2024-04-04 NOTE — Assessment & Plan Note (Signed)
 BP Readings from Last 3 Encounters:  04/04/24 122/82  02/08/24 (!) 138/94  11/17/23 136/80  Well-controlled hypertension Continue Zestoretic  20-12.5 mg daily Uncontrolled diabetes with hemoglobin A1c at 8.2 Lab Results  Component Value Date   HGBA1C 8.2 (H) 02/08/2024  Not tolerating metformin  very well.  Recommend to stop it. Start glipizide  5 mg twice a day.  Hypoglycemia precautions given. Cardiovascular risks associated with hypertension and diabetes discussed Diet and nutrition discussed Recommend follow-up in 3 months

## 2024-04-04 NOTE — Patient Instructions (Signed)
 Stop metformin  and atorvastatin  Start glipizide  5 mg twice a day  Diabetes: Carbohydrate Counting for Adults Carbohydrate counting is a method of keeping track of how many carbohydrates you eat. Eating carbohydrates increases the amount of sugar, also called glucose, in your blood. By counting how many carbohydrates you eat, you can improve how well you manage your blood sugar. This, in turn, helps you manage your diabetes. Carbohydrates are measured in grams (g) per serving. It's important to know how many carbohydrates (in grams or by serving size) you can have in each meal. This is different for every person. A dietitian can help you make a meal plan and calculate how many carbohydrates you should have at each meal and snack. What foods contain carbohydrates? Carbohydrates are found in these foods: Grains, such as breads and cereals. Dried beans and soy products. Starchy vegetables, such as potatoes, peas, and corn. Fruit and fruit juices. Milk and yogurt. Sweets and snack foods like cake, cookies, candy, chips, and soft drinks. How do I count carbohydrates in foods? There are two ways to count carbohydrates in food. You can read food labels or learn standard serving sizes of foods. You can use either of these methods or a combination of both. Using the Nutrition Facts label The Nutrition Facts list is included on the labels of almost all packaged foods and drinks in the [place] It includes: The serving size. Information about nutrients in each serving. This includes the grams of carbohydrate per serving. To use the Nutrition Facts, decide how many servings you will have. Then, multiply the number of servings by the number of carbohydrates per serving. The resulting number is the total grams of carbohydrates that you'll be having. Learning the standard serving sizes of foods When you eat carbohydrate foods that aren't packaged or don't include Nutrition Facts on the label, you need to measure  the servings in order to count the grams of carbohydrates. Measure the foods that you'll eat with a food scale or measuring cup, if needed. Decide how many standard-size servings you'll eat. Multiply the number of servings by 15. For foods that contain carbohydrates, one serving equals 15 g of carbohydrates. For example, if you eat 2 cups or 10 oz (300 g) of strawberries, you'll have eaten 2 servings and 30 g of carbohydrates (2 servings x 15 g = 30 g). For foods that have more than one food mixed, such as soups and casseroles, you must count the carbohydrates in each food that's included. Here's a list of standard serving sizes for common carbohydrate-rich foods. Each of these servings has about 15 g of carbohydrates: 1 slice of bread. 1 six-inch (15 cm) tortilla. ? cup or 2 oz (53 g) of cooked rice or pasta.  cup or 3 oz (85 g) of cooked or canned, drained, and rinsed beans or lentils.  cup or 3 oz (85 g) of a starchy vegetable, such as peas, corn, or squash.  cup or 4 oz (120 g) of hot cereal.  cup or 3 oz (85 g) of boiled or mashed potatoes, or  or 3 oz (85 g) of a large baked potato.  cup or 4 fl oz (118 mL) of fruit juice. 1 cup or 8 fl oz (237 mL) of milk. 1 small or 4 oz (106 g) apple.  or 2 oz (63 g) of a medium banana. 1 cup or 5 oz (150 g) of strawberries. 3 cups or 1 oz (28.3 g) of popped popcorn. What is an example  of carbohydrate counting? To calculate the grams of carbohydrates in this sample meal, follow the steps below. Sample meal 3 oz (85 g) chicken breast. ? cup or 4 oz (106 g) of brown rice.  cup or 3 oz (85 g) of corn. 1 cup or 8 fl oz (237 mL) of milk. 1 cup or 5 oz (150 g) of strawberries with sugar-free whipped topping. Carbohydrate calculation Identify the foods that have carbohydrates: Rice. Corn. Milk. Strawberries. Calculate how many servings you have of each food: 2 servings of rice. 1 serving of corn. 1 serving of milk. 1 serving of  strawberries. Multiply each number of servings by 15 g: 2 servings of rice x 15 g = 30 g. 1 serving of corn x 15 g = 15 g. 1 serving of milk x 15 g = 15 g. 1 serving of strawberries x 15 g = 15 g. Add together all of the amounts to find the total grams of carbohydrates eaten: 30 g + 15 g + 15 g + 15 g = 75 g of carbohydrates total. Where to find more information To learn more, go to: American Diabetes Association at diabetes.org. Click Search and type carb counting. Find the link you need. Centers for Disease Control and Prevention at [employer]no. Click Search and type diabetes. Find the link you need. Academy of Nutrition and Dietetics: eatright.org This information is not intended to replace advice given to you by your health care provider. Make sure you discuss any questions you have with your health care provider. Document Revised: 06/16/2023 Document Reviewed: 06/16/2023 Elsevier Patient Education  2025 [employer].

## 2024-04-04 NOTE — Progress Notes (Signed)
 Adam Wiley 48 y.o.   Chief Complaint  Patient presents with   Medication Reaction    Patient here for medication side effects. Patient believes the metformin  is nausea, some back pain. He is still taking metformin . Patient states from last visit he is still having right ear pain     HISTORY OF PRESENT ILLNESS: This is a 48 y.o. male A1A concerned about possible medication reaction History of dyslipidemia on atorvastatin .  Complaining of intermittent back pain Also thinks metformin  is making him nauseous with occasional cramping Also still complaining of intermittent right ear pain.  Was able to follow-up with dentist who raise possibility of teeth grinding. No other complaints or medical concerns today.  HPI   Prior to Admission medications   Medication Sig Start Date End Date Taking? Authorizing Provider  atorvastatin  (LIPITOR) 40 MG tablet Take 1 tablet (40 mg) by mouth daily. 09/28/23  Yes Saramarie Stinger, Emil Schanz, MD  cetirizine  (ZYRTEC  ALLERGY) 10 MG tablet Take 1 tablet (10 mg total) by mouth daily. 06/12/23  Yes Rising, Asberry, PA-C  glipiZIDE  (GLUCOTROL ) 5 MG tablet Take 1 tablet (5 mg total) by mouth 2 (two) times daily before a meal. 04/04/24  Yes Paulette Lynch, Emil Schanz, MD  lisinopril -hydrochlorothiazide  (ZESTORETIC ) 20-12.5 MG tablet Take 1 tablet by mouth daily. 03/27/24  Yes Tiya Schrupp, Emil Schanz, MD  metFORMIN  (GLUCOPHAGE ) 500 MG tablet Take 1 tablet (500 mg total) by mouth 2 (two) times daily with a meal. 11/17/23  Yes Aliana Kreischer, Emil Schanz, MD  clotrimazole -betamethasone  (LOTRISONE ) cream Apply 1 Application topically daily. Patient not taking: Reported on 04/04/2024 08/09/23   Blaze Nylund Jose, MD  esomeprazole  (NEXIUM ) 40 MG capsule TAKE 1 CAPSULE BY MOUTH DAILY Patient not taking: Reported on 04/04/2024 12/14/22 02/08/24  Jarold Medici, MD  fluticasone  (FLONASE ) 50 MCG/ACT nasal spray Place 2 sprays into both nostrils daily. Patient not taking: Reported on 04/04/2024  06/12/23   Rising, Asberry, PA-C  guaiFENesin  (MUCINEX ) 600 MG 12 hr tablet Take 2 tablets (1,200 mg total) by mouth 2 (two) times daily. Patient not taking: Reported on 04/04/2024 06/12/23   Rising, Asberry, PA-C  halobetasol  (ULTRAVATE ) 0.05 % cream Apply topically twice daily as needed. Not to face, groin or underarms 10/11/23   Shona Rush, MD  methylPREDNISolone  (MEDROL  DOSEPAK) 4 MG TBPK tablet Take as directed Patient not taking: Reported on 04/04/2024 02/08/24   Kataryna Mcquilkin Jose, MD  metoprolol  tartrate (LOPRESSOR ) 100 MG tablet Take 1 tablet (100 mg total) by mouth once for 1 dose. Please take one time dose 100mg  metoprolol  tartrate 2 hr prior to cardiac CT for HR control IF HR >55bpm. Patient not taking: Reported on 04/04/2024 07/09/23 02/08/24  Barbaraann Darryle Ned, MD    No Known Allergies  Patient Active Problem List   Diagnosis Date Noted   TMJ (temporomandibular joint syndrome) 02/08/2024   Hypertension associated with diabetes (HCC) 02/10/2023   Dyslipidemia associated with type 2 diabetes mellitus (HCC) 02/10/2023   Pure hypercholesterolemia 03/07/2020   Uncontrolled type 2 diabetes mellitus with hyperglycemia (HCC) 06/28/2018   Essential hypertension, benign 06/28/2018    Past Medical History:  Diagnosis Date   Diabetes mellitus type 2 in nonobese (HCC) 03/07/2020   Diabetes mellitus without complication (HCC)    Exertional dyspnea 03/07/2020   Hypertension    Pure hypercholesterolemia 03/07/2020   Tobacco abuse 03/07/2020    No past surgical history on file.  Social History   Socioeconomic History   Marital status: Married    Spouse name: Not  on file   Number of children: Not on file   Years of education: Not on file   Highest education level: 12th grade  Occupational History   Not on file  Tobacco Use   Smoking status: Former    Current packs/day: 0.00    Average packs/day: 0.3 packs/day for 5.0 years (1.3 ttl pk-yrs)    Types: Cigarettes    Start date:  2017    Quit date: 2022    Years since quitting: 3.7   Smokeless tobacco: Never  Vaping Use   Vaping status: Never Used  Substance and Sexual Activity   Alcohol use: Yes    Comment: occasionally   Drug use: No   Sexual activity: Yes    Birth control/protection: None  Other Topics Concern   Not on file  Social History Narrative   Not on file   Social Drivers of Health   Financial Resource Strain: Low Risk  (03/31/2024)   Overall Financial Resource Strain (CARDIA)    Difficulty of Paying Living Expenses: Not hard at all  Food Insecurity: Food Insecurity Present (03/31/2024)   Hunger Vital Sign    Worried About Running Out of Food in the Last Year: Never true    Ran Out of Food in the Last Year: Often true  Transportation Needs: No Transportation Needs (03/31/2024)   PRAPARE - Administrator, Civil Service (Medical): No    Lack of Transportation (Non-Medical): No  Physical Activity: Insufficiently Active (03/31/2024)   Exercise Vital Sign    Days of Exercise per Week: 3 days    Minutes of Exercise per Session: 30 min  Stress: No Stress Concern Present (03/31/2024)   [employer] of Occupational Health - Occupational Stress Questionnaire    Feeling of Stress: Not at all  Social Connections: Socially Integrated (03/31/2024)   Social Connection and Isolation Panel    Frequency of Communication with Friends and Family: Once a week    Frequency of Social Gatherings with Friends and Family: Twice a week    Attends Religious Services: 1 to 4 times per year    Active Member of [employer] or Organizations: Yes    Attends Engineer, structural: More than 4 times per year    Marital Status: Married  Catering manager Violence: Unknown (10/13/2021)   Received from [facility]   HITS    Physically Hurt: Not on file    Insult or Talk Down To: Not on file    Threaten Physical Harm: Not on file    Scream or Curse: Not on file    Family History  Problem Relation Age of  Onset   Diabetes Mother    Diabetes Father    High blood pressure Other    Hypertension Brother    Hypertension Sister    Diabetes Sister      Review of Systems  Constitutional: Negative.  Negative for chills and fever.  HENT:  Positive for ear pain. Negative for sore throat.   Respiratory: Negative.  Negative for cough and shortness of breath.   Cardiovascular: Negative.  Negative for chest pain and palpitations.  Gastrointestinal:  Negative for abdominal pain, diarrhea, nausea and vomiting.  Genitourinary: Negative.  Negative for dysuria and hematuria.  Skin: Negative.  Negative for rash.  Neurological:  Negative for dizziness and headaches.  All other systems reviewed and are negative.   Vitals:   04/04/24 0848  BP: 122/82  Pulse: 85  Temp: 98.2 F (36.8 C)  SpO2:  98%    Physical Exam Vitals reviewed.  Constitutional:      Appearance: Normal appearance.  Eyes:     Extraocular Movements: Extraocular movements intact.  Cardiovascular:     Rate and Rhythm: Normal rate.  Pulmonary:     Effort: Pulmonary effort is normal.  Skin:    General: Skin is warm and dry.  Neurological:     Mental Status: He is alert and oriented to person, place, and time.      ASSESSMENT & PLAN: Problem List Items Addressed This Visit       Cardiovascular and Mediastinum   Hypertension associated with diabetes (HCC) - Primary   BP Readings from Last 3 Encounters:  04/04/24 122/82  02/08/24 (!) 138/94  11/17/23 136/80  Well-controlled hypertension Continue Zestoretic  20-12.5 mg daily Uncontrolled diabetes with hemoglobin A1c at 8.2 Lab Results  Component Value Date   HGBA1C 8.2 (H) 02/08/2024  Not tolerating metformin  very well.  Recommend to stop it. Start glipizide  5 mg twice a day.  Hypoglycemia precautions given. Cardiovascular risks associated with hypertension and diabetes discussed Diet and nutrition discussed Recommend follow-up in 3 months        Relevant  Medications   glipiZIDE  (GLUCOTROL ) 5 MG tablet     Endocrine   Dyslipidemia associated with type 2 diabetes mellitus (HCC)   Atorvastatin  may be creating muscle aches Recommend to stop it for couple weeks We will reassess after that The 10-year ASCVD risk score (Arnett DK, et al., 2019) is: 15.6%   Values used to calculate the score:     Age: 59 years     Clincally relevant sex: Male     Is Non-Hispanic African American: Yes     Diabetic: Yes     Tobacco smoker: No     Systolic Blood Pressure: 122 mmHg     Is BP treated: Yes     HDL Cholesterol: 55.1 mg/dL     Total Cholesterol: 269 mg/dL       Relevant Medications   glipiZIDE  (GLUCOTROL ) 5 MG tablet     Other   Otalgia, right ear   Symptoms still present on and off, waxing and waning. Already followed with dentist Recommend ENT evaluation Referral placed today. Pain management discussed      Relevant Orders   Ambulatory referral to ENT   Patient Instructions  Stop metformin  and atorvastatin  Start glipizide  5 mg twice a day  Diabetes: Carbohydrate Counting for Adults Carbohydrate counting is a method of keeping track of how many carbohydrates you eat. Eating carbohydrates increases the amount of sugar, also called glucose, in your blood. By counting how many carbohydrates you eat, you can improve how well you manage your blood sugar. This, in turn, helps you manage your diabetes. Carbohydrates are measured in grams (g) per serving. It's important to know how many carbohydrates (in grams or by serving size) you can have in each meal. This is different for every person. A dietitian can help you make a meal plan and calculate how many carbohydrates you should have at each meal and snack. What foods contain carbohydrates? Carbohydrates are found in these foods: Grains, such as breads and cereals. Dried beans and soy products. Starchy vegetables, such as potatoes, peas, and corn. Fruit and fruit juices. Milk and  yogurt. Sweets and snack foods like cake, cookies, candy, chips, and soft drinks. How do I count carbohydrates in foods? There are two ways to count carbohydrates in food. You can read food labels or  learn standard serving sizes of foods. You can use either of these methods or a combination of both. Using the Nutrition Facts label The Nutrition Facts list is included on the labels of almost all packaged foods and drinks in the [place] It includes: The serving size. Information about nutrients in each serving. This includes the grams of carbohydrate per serving. To use the Nutrition Facts, decide how many servings you will have. Then, multiply the number of servings by the number of carbohydrates per serving. The resulting number is the total grams of carbohydrates that you'll be having. Learning the standard serving sizes of foods When you eat carbohydrate foods that aren't packaged or don't include Nutrition Facts on the label, you need to measure the servings in order to count the grams of carbohydrates. Measure the foods that you'll eat with a food scale or measuring cup, if needed. Decide how many standard-size servings you'll eat. Multiply the number of servings by 15. For foods that contain carbohydrates, one serving equals 15 g of carbohydrates. For example, if you eat 2 cups or 10 oz (300 g) of strawberries, you'll have eaten 2 servings and 30 g of carbohydrates (2 servings x 15 g = 30 g). For foods that have more than one food mixed, such as soups and casseroles, you must count the carbohydrates in each food that's included. Here's a list of standard serving sizes for common carbohydrate-rich foods. Each of these servings has about 15 g of carbohydrates: 1 slice of bread. 1 six-inch (15 cm) tortilla. ? cup or 2 oz (53 g) of cooked rice or pasta.  cup or 3 oz (85 g) of cooked or canned, drained, and rinsed beans or lentils.  cup or 3 oz (85 g) of a starchy vegetable, such as peas, corn,  or squash.  cup or 4 oz (120 g) of hot cereal.  cup or 3 oz (85 g) of boiled or mashed potatoes, or  or 3 oz (85 g) of a large baked potato.  cup or 4 fl oz (118 mL) of fruit juice. 1 cup or 8 fl oz (237 mL) of milk. 1 small or 4 oz (106 g) apple.  or 2 oz (63 g) of a medium banana. 1 cup or 5 oz (150 g) of strawberries. 3 cups or 1 oz (28.3 g) of popped popcorn. What is an example of carbohydrate counting? To calculate the grams of carbohydrates in this sample meal, follow the steps below. Sample meal 3 oz (85 g) chicken breast. ? cup or 4 oz (106 g) of brown rice.  cup or 3 oz (85 g) of corn. 1 cup or 8 fl oz (237 mL) of milk. 1 cup or 5 oz (150 g) of strawberries with sugar-free whipped topping. Carbohydrate calculation Identify the foods that have carbohydrates: Rice. Corn. Milk. Strawberries. Calculate how many servings you have of each food: 2 servings of rice. 1 serving of corn. 1 serving of milk. 1 serving of strawberries. Multiply each number of servings by 15 g: 2 servings of rice x 15 g = 30 g. 1 serving of corn x 15 g = 15 g. 1 serving of milk x 15 g = 15 g. 1 serving of strawberries x 15 g = 15 g. Add together all of the amounts to find the total grams of carbohydrates eaten: 30 g + 15 g + 15 g + 15 g = 75 g of carbohydrates total. Where to find more information To learn more, go to: [employer]  Diabetes Association at diabetes.org. Click Search and type carb counting. Find the link you need. Centers for Disease Control and Prevention at TonerPromos.no. Click Search and type diabetes. Find the link you need. Academy of Nutrition and Dietetics: eatright.org This information is not intended to replace advice given to you by your health care provider. Make sure you discuss any questions you have with your health care provider. Document Revised: 06/16/2023 Document Reviewed: 06/16/2023 Elsevier Patient Education  2025 Elsevier Inc.     Emil Schaumann,  MD [facility]

## 2024-04-04 NOTE — Assessment & Plan Note (Signed)
 Symptoms still present on and off, waxing and waning. Already followed with dentist Recommend ENT evaluation Referral placed today. Pain management discussed

## 2024-04-04 NOTE — Assessment & Plan Note (Signed)
 Atorvastatin  may be creating muscle aches Recommend to stop it for couple weeks We will reassess after that The 10-year ASCVD risk score (Arnett DK, et al., 2019) is: 15.6%   Values used to calculate the score:     Age: 48 years     Clincally relevant sex: Male     Is Non-Hispanic African American: Yes     Diabetic: Yes     Tobacco smoker: No     Systolic Blood Pressure: 122 mmHg     Is BP treated: Yes     HDL Cholesterol: 55.1 mg/dL     Total Cholesterol: 269 mg/dL

## 2024-04-25 ENCOUNTER — Ambulatory Visit (INDEPENDENT_AMBULATORY_CARE_PROVIDER_SITE_OTHER): Payer: Self-pay | Admitting: Physician Assistant

## 2024-04-25 ENCOUNTER — Encounter (INDEPENDENT_AMBULATORY_CARE_PROVIDER_SITE_OTHER): Payer: Self-pay | Admitting: Physician Assistant

## 2024-04-25 VITALS — BP 131/82 | HR 86 | Temp 98.2°F | Ht 72.0 in | Wt 180.0 lb

## 2024-04-25 DIAGNOSIS — M26621 Arthralgia of right temporomandibular joint: Secondary | ICD-10-CM

## 2024-04-25 DIAGNOSIS — M26609 Unspecified temporomandibular joint disorder, unspecified side: Secondary | ICD-10-CM

## 2024-04-25 NOTE — Progress Notes (Unsigned)
 Dear Dr. Purcell, Here is my assessment for our mutual patient, Adam Wiley. Thank you for allowing me the opportunity to care for your patient. Please do not hesitate to contact me should you have any other questions. Sincerely, Chyrl Cohen PA-C  Otolaryngology Clinic Note Referring provider: Dr. Purcell HPI:  Adam Wiley is a 49 y.o. male kindly referred by Dr. Purcell   The patient is a 48 year old male seen in our office for evaluation of right ear pain.  The patient notes several months ago he started having pain anterior to his right ear.  He notes he woke up in the morning with some discomfort.  He felt like he was having dental pain.  He followed up with his dentist and was told that this was likely secondary to grinding his teeth.  He notes generally it starts off in the anterior preauricular space and then travels down towards the jaw.  He denies any problems with his ears including drainage, decreased hearing, ringing, dizziness.  No history recurrent ear infections.  He does have some sinus related issues including sinus congestion during the spring and fall and has had a sinus infection previously.     Independent Review of Additional Tests or Records:  None   PMH/Meds/All/SocHx/FamHx/ROS:   Past Medical History:  Diagnosis Date   Diabetes mellitus type 2 in nonobese (HCC) 03/07/2020   Diabetes mellitus without complication (HCC)    Exertional dyspnea 03/07/2020   Hypertension    Pure hypercholesterolemia 03/07/2020   Tobacco abuse 03/07/2020     History reviewed. No pertinent surgical history.  Family History  Problem Relation Age of Onset   Diabetes Mother    Diabetes Father    High blood pressure Other    Hypertension Brother    Hypertension Sister    Diabetes Sister      Social Connections: Socially Integrated (03/31/2024)   Social Connection and Isolation Panel    Frequency of Communication with Friends and Family: Once a week    Frequency of Social  Gatherings with Friends and Family: Twice a week    Attends Religious Services: 1 to 4 times per year    Active Member of [employer] or Organizations: Yes    Attends Engineer, structural: More than 4 times per year    Marital Status: Married      Current Outpatient Medications:    cetirizine  (ZYRTEC  ALLERGY) 10 MG tablet, Take 1 tablet (10 mg total) by mouth daily., Disp: 30 tablet, Rfl: 2   glipiZIDE  (GLUCOTROL ) 5 MG tablet, Take 1 tablet (5 mg total) by mouth 2 (two) times daily before a meal., Disp: 180 tablet, Rfl: 3   lisinopril -hydrochlorothiazide  (ZESTORETIC ) 20-12.5 MG tablet, Take 1 tablet by mouth daily., Disp: 90 tablet, Rfl: 3   atorvastatin  (LIPITOR) 40 MG tablet, Take 1 tablet (40 mg) by mouth daily., Disp: 90 tablet, Rfl: 3   clotrimazole -betamethasone  (LOTRISONE ) cream, Apply 1 Application topically daily. (Patient not taking: Reported on 04/25/2024), Disp: 30 g, Rfl: 0   esomeprazole  (NEXIUM ) 40 MG capsule, TAKE 1 CAPSULE BY MOUTH DAILY (Patient not taking: Reported on 04/25/2024), Disp: 90 capsule, Rfl: 1   fluticasone  (FLONASE ) 50 MCG/ACT nasal spray, Place 2 sprays into both nostrils daily. (Patient not taking: Reported on 04/25/2024), Disp: 16 g, Rfl: 2   guaiFENesin  (MUCINEX ) 600 MG 12 hr tablet, Take 2 tablets (1,200 mg total) by mouth 2 (two) times daily. (Patient not taking: Reported on 04/04/2024), Disp: 20 tablet, Rfl: 0   metFORMIN  (  GLUCOPHAGE ) 500 MG tablet, Take 1 tablet (500 mg total) by mouth 2 (two) times daily with a meal., Disp: 180 tablet, Rfl: 3   methylPREDNISolone  (MEDROL  DOSEPAK) 4 MG TBPK tablet, Take as directed (Patient not taking: Reported on 04/04/2024), Disp: 21 tablet, Rfl: 1   metoprolol  tartrate (LOPRESSOR ) 100 MG tablet, Take 1 tablet (100 mg total) by mouth once for 1 dose. Please take one time dose 100mg  metoprolol  tartrate 2 hr prior to cardiac CT for HR control IF HR >55bpm. (Patient not taking: Reported on 04/04/2024), Disp: 1 tablet, Rfl:  0   Physical Exam:   BP (!) 156/84   Pulse 86   Temp 98.2 F (36.8 C)   Ht 6' (1.829 m)   Wt 180 lb (81.6 kg)   SpO2 98%   BMI 24.41 kg/m   Pertinent Findings  CN II-XII intact Bilateral EAC clear and TM intact with well pneumatized middle ear spaces Anterior rhinoscopy: Septum midline; bilateral inferior turbinates with hypertrophy No lesions of oral cavity/oropharynx; dentition limits Tenderness with palpation of the right TMJ, no crepitus No purulence from Stensen's duct No obviously palpable neck masses/lymphadenopathy/thyromegaly No respiratory distress or stridor  Seprately Identifiable Procedures:  None  Impression & Plans:  Adam Wiley is a 48 y.o. male with the following   TMJ-  The patient symptoms are most consistent with TMJ disorder.  Low suspicion for ear pathology, reassuring exam today, no clinical history to suggest the ear.  No issues related to the parotid.  I discussed following up with dentist for mouthguard.  If he develops any new or worsening signs or symptoms I am happy to see him back in the office for repeat evaluation.  The patient verbalized understanding and agreement to today's plan had no further questions or concerns.   - f/u PRN   Thank you for allowing me the opportunity to care for your patient. Please do not hesitate to contact me should you have any other questions.  Sincerely, Chyrl Cohen PA-C [facility] Phone: [phone] Fax: [phone]  04/25/2024, 9:48 AM

## 2024-05-15 ENCOUNTER — Ambulatory Visit (INDEPENDENT_AMBULATORY_CARE_PROVIDER_SITE_OTHER): Payer: Self-pay | Admitting: Emergency Medicine

## 2024-05-15 VITALS — BP 120/76 | HR 86 | Temp 98.2°F | Ht 72.0 in | Wt 178.0 lb

## 2024-05-15 DIAGNOSIS — I152 Hypertension secondary to endocrine disorders: Secondary | ICD-10-CM

## 2024-05-15 DIAGNOSIS — E785 Hyperlipidemia, unspecified: Secondary | ICD-10-CM

## 2024-05-15 DIAGNOSIS — T07XXXA Unspecified multiple injuries, initial encounter: Secondary | ICD-10-CM | POA: Insufficient documentation

## 2024-05-15 DIAGNOSIS — Z113 Encounter for screening for infections with a predominantly sexual mode of transmission: Secondary | ICD-10-CM

## 2024-05-15 DIAGNOSIS — E1159 Type 2 diabetes mellitus with other circulatory complications: Secondary | ICD-10-CM

## 2024-05-15 DIAGNOSIS — E1169 Type 2 diabetes mellitus with other specified complication: Secondary | ICD-10-CM

## 2024-05-15 LAB — CBC WITH DIFFERENTIAL/PLATELET
Basophils Absolute: 0 K/uL (ref 0.0–0.1)
Basophils Relative: 0.4 % (ref 0.0–3.0)
Eosinophils Absolute: 0.1 K/uL (ref 0.0–0.7)
Eosinophils Relative: 1.4 % (ref 0.0–5.0)
HCT: 44 % (ref 39.0–52.0)
Hemoglobin: 14.6 g/dL (ref 13.0–17.0)
Lymphocytes Relative: 25.5 % (ref 12.0–46.0)
Lymphs Abs: 1.7 K/uL (ref 0.7–4.0)
MCHC: 33.1 g/dL (ref 30.0–36.0)
MCV: 88.8 fl (ref 78.0–100.0)
Monocytes Absolute: 0.4 K/uL (ref 0.1–1.0)
Monocytes Relative: 5.6 % (ref 3.0–12.0)
Neutro Abs: 4.5 K/uL (ref 1.4–7.7)
Neutrophils Relative %: 67.1 % (ref 43.0–77.0)
Platelets: 324 K/uL (ref 150.0–400.0)
RBC: 4.95 Mil/uL (ref 4.22–5.81)
RDW: 14.6 % (ref 11.5–15.5)
WBC: 6.7 K/uL (ref 4.0–10.5)

## 2024-05-15 LAB — COMPREHENSIVE METABOLIC PANEL WITH GFR
ALT: 24 U/L (ref 0–53)
AST: 16 U/L (ref 0–37)
Albumin: 4.6 g/dL (ref 3.5–5.2)
Alkaline Phosphatase: 77 U/L (ref 39–117)
BUN: 15 mg/dL (ref 6–23)
CO2: 30 meq/L (ref 19–32)
Calcium: 10 mg/dL (ref 8.4–10.5)
Chloride: 104 meq/L (ref 96–112)
Creatinine, Ser: 1.11 mg/dL (ref 0.40–1.50)
GFR: 77.3 mL/min (ref >60.00)
Glucose, Bld: 167 mg/dL — ABNORMAL HIGH (ref 70–99)
Potassium: 4.6 meq/L (ref 3.5–5.1)
Sodium: 139 meq/L (ref 135–145)
Total Bilirubin: 0.5 mg/dL (ref 0.2–1.2)
Total Protein: 6.9 g/dL (ref 6.0–8.3)

## 2024-05-15 LAB — URINALYSIS
Bilirubin Urine: NEGATIVE
Hgb urine dipstick: NEGATIVE
Ketones, ur: NEGATIVE
Leukocytes,Ua: NEGATIVE
Nitrite: NEGATIVE
Specific Gravity, Urine: 1.02 (ref 1.000–1.030)
Total Protein, Urine: NEGATIVE
Urine Glucose: 100 — AB
Urobilinogen, UA: 0.2 (ref 0.0–1.0)
pH: 6 (ref 5.0–8.0)

## 2024-05-15 LAB — LIPID PANEL
Cholesterol: 225 mg/dL — ABNORMAL HIGH (ref 0–200)
HDL: 50.8 mg/dL (ref >39.00)
LDL Cholesterol: 140 mg/dL — ABNORMAL HIGH (ref 0–99)
NonHDL: 174.04
Total CHOL/HDL Ratio: 4
Triglycerides: 168 mg/dL — ABNORMAL HIGH (ref 0.0–149.0)
VLDL: 33.6 mg/dL (ref 0.0–40.0)

## 2024-05-15 LAB — HEMOGLOBIN A1C: Hgb A1c MFr Bld: 8.3 % — ABNORMAL HIGH (ref 4.6–6.5)

## 2024-05-15 LAB — MICROALBUMIN / CREATININE URINE RATIO
Creatinine,U: 132.3 mg/dL
Microalb Creat Ratio: 34.6 mg/g — ABNORMAL HIGH (ref 0.0–30.0)
Microalb, Ur: 4.6 mg/dL — ABNORMAL HIGH (ref 0.0–1.9)

## 2024-05-15 NOTE — Assessment & Plan Note (Signed)
 Atorvastatin  may be creating muscle aches Recommend to stop it for couple weeks We will reassess after that Muscle aches gone after stopping atorvastatin 

## 2024-05-15 NOTE — Patient Instructions (Signed)
 Health Maintenance, Male  Adopting a healthy lifestyle and getting preventive care are important in promoting health and wellness. Ask your health care provider about:  The right schedule for you to have regular tests and exams.  Things you can do on your own to prevent diseases and keep yourself healthy.  What should I know about diet, weight, and exercise?  Eat a healthy diet    Eat a diet that includes plenty of vegetables, fruits, low-fat dairy products, and lean protein.  Do not eat a lot of foods that are high in solid fats, added sugars, or sodium.  Maintain a healthy weight  Body mass index (BMI) is a measurement that can be used to identify possible weight problems. It estimates body fat based on height and weight. Your health care provider can help determine your BMI and help you achieve or maintain a healthy weight.  Get regular exercise  Get regular exercise. This is one of the most important things you can do for your health. Most adults should:  Exercise for at least 150 minutes each week. The exercise should increase your heart rate and make you sweat (moderate-intensity exercise).  Do strengthening exercises at least twice a week. This is in addition to the moderate-intensity exercise.  Spend less time sitting. Even light physical activity can be beneficial.  Watch cholesterol and blood lipids  Have your blood tested for lipids and cholesterol at 50 years of age, then have this test every 5 years.  You may need to have your cholesterol levels checked more often if:  Your lipid or cholesterol levels are high.  You are older than 50 years of age.  You are at high risk for heart disease.  What should I know about cancer screening?  Many types of cancers can be detected early and may often be prevented. Depending on your health history and family history, you may need to have cancer screening at various ages. This may include screening for:  Colorectal cancer.  Prostate cancer.  Skin cancer.  Lung  cancer.  What should I know about heart disease, diabetes, and high blood pressure?  Blood pressure and heart disease  High blood pressure causes heart disease and increases the risk of stroke. This is more likely to develop in people who have high blood pressure readings or are overweight.  Talk with your health care provider about your target blood pressure readings.  Have your blood pressure checked:  Every 3-5 years if you are 50-52 years of age.  Every year if you are 3 years old or older.  If you are between the ages of 60 and 72 and are a current or former smoker, ask your health care provider if you should have a one-time screening for abdominal aortic aneurysm (AAA).  Diabetes  Have regular diabetes screenings. This checks your fasting blood sugar level. Have the screening done:  Once every three years after age 50 if you are at a normal weight and have a low risk for diabetes.  More often and at a younger age if you are overweight or have a high risk for diabetes.  What should I know about preventing infection?  Hepatitis B  If you have a higher risk for hepatitis B, you should be screened for this virus. Talk with your health care provider to find out if you are at risk for hepatitis B infection.  Hepatitis C  Blood testing is recommended for:  Everyone born from [birth year] through 1965.  Anyone  with known risk factors for hepatitis C.  Sexually transmitted infections (STIs)  You should be screened each year for STIs, including gonorrhea and chlamydia, if:  You are sexually active and are younger than 50 years of age.  You are older than 50 years of age and your health care provider tells you that you are at risk for this type of infection.  Your sexual activity has changed since you were last screened, and you are at increased risk for chlamydia or gonorrhea. Ask your health care provider if you are at risk.  Ask your health care provider about whether you are at high risk for HIV. Your health care provider  may recommend a prescription medicine to help prevent HIV infection. If you choose to take medicine to prevent HIV, you should first get tested for HIV. You should then be tested every 3 months for as long as you are taking the medicine.  Follow these instructions at home:  Alcohol use  Do not drink alcohol if your health care provider tells you not to drink.  If you drink alcohol:  Limit how much you have to 0-2 drinks a day.  Know how much alcohol is in your drink. In the [place] one drink equals one 12 oz bottle of beer (355 mL), one 5 oz glass of wine (148 mL), or one 1 oz glass of hard liquor (44 mL).  Lifestyle  Do not use any products that contain nicotine or tobacco. These products include cigarettes, chewing tobacco, and vaping devices, such as e-cigarettes. If you need help quitting, ask your health care provider.  Do not use street drugs.  Do not share needles.  Ask your health care provider for help if you need support or information about quitting drugs.  General instructions  Schedule regular health, dental, and eye exams.  Stay current with your vaccines.  Tell your health care provider if:  You often feel depressed.  You have ever been abused or do not feel safe at home.  Summary  Adopting a healthy lifestyle and getting preventive care are important in promoting health and wellness.  Follow your health care provider's instructions about healthy diet, exercising, and getting tested or screened for diseases.  Follow your health care provider's instructions on monitoring your cholesterol and blood pressure.  This information is not intended to replace advice given to you by your health care provider. Make sure you discuss any questions you have with your health care provider.  Document Revised: 11/18/2020 Document Reviewed: 11/18/2020  Elsevier Patient Education  2024 [employer].

## 2024-05-15 NOTE — Progress Notes (Signed)
 Adam Wiley 48 y.o.   Chief Complaint  Patient presents with   Pain    Pt states that there pain in the private area this has been going on for a few days     HISTORY OF PRESENT ILLNESS: This is a 48 y.o. male status post injury about 3 days ago.  Injured right lumbar area.  Pain radiating to groin area. Also injured inside of left knee.  Persistent pain for couple days Recently diagnosed with diabetes.  Requesting blood work today No other complaints or medical concerns today.  HPI   Prior to Admission medications   Medication Sig Start Date End Date Taking? Authorizing Provider  cetirizine  (ZYRTEC  ALLERGY) 10 MG tablet Take 1 tablet (10 mg total) by mouth daily. 06/12/23  Yes Rising, Asberry, PA-C  clotrimazole -betamethasone  (LOTRISONE ) cream Apply 1 Application topically daily. 08/09/23  Yes Purcell Emil Schanz, MD  esomeprazole  (NEXIUM ) 40 MG capsule TAKE 1 CAPSULE BY MOUTH DAILY 12/14/22 05/15/24 Yes Jarold Medici, MD  fluticasone  (FLONASE ) 50 MCG/ACT nasal spray Place 2 sprays into both nostrils daily. 06/12/23  Yes Rising, Asberry, PA-C  glipiZIDE  (GLUCOTROL ) 5 MG tablet Take 1 tablet (5 mg total) by mouth 2 (two) times daily before a meal. 04/04/24  Yes Kharizma Lesnick, Emil Schanz, MD  lisinopril -hydrochlorothiazide  (ZESTORETIC ) 20-12.5 MG tablet Take 1 tablet by mouth daily. 03/27/24  Yes Annalysa Mohammad, Emil Schanz, MD  atorvastatin  (LIPITOR) 40 MG tablet Take 1 tablet (40 mg) by mouth daily. 09/28/23   Aishia Barkey Jose, MD  guaiFENesin  (MUCINEX ) 600 MG 12 hr tablet Take 2 tablets (1,200 mg total) by mouth 2 (two) times daily. Patient not taking: Reported on 04/04/2024 06/12/23   Rising, Asberry, PA-C  metFORMIN  (GLUCOPHAGE ) 500 MG tablet Take 1 tablet (500 mg total) by mouth 2 (two) times daily with a meal. 11/17/23   Panzy Bubeck, Emil Schanz, MD  methylPREDNISolone  (MEDROL  DOSEPAK) 4 MG TBPK tablet Take as directed Patient not taking: Reported on 04/04/2024 02/08/24   Miara Emminger  Jose, MD  metoprolol  tartrate (LOPRESSOR ) 100 MG tablet Take 1 tablet (100 mg total) by mouth once for 1 dose. Please take one time dose 100mg  metoprolol  tartrate 2 hr prior to cardiac CT for HR control IF HR >55bpm. Patient not taking: Reported on 04/04/2024 07/09/23 02/08/24  Barbaraann Darryle Ned, MD    No Known Allergies  Patient Active Problem List   Diagnosis Date Noted   Otalgia, right ear 04/04/2024   TMJ (temporomandibular joint syndrome) 02/08/2024   Hypertension associated with diabetes (HCC) 02/10/2023   Dyslipidemia associated with type 2 diabetes mellitus (HCC) 02/10/2023   Pure hypercholesterolemia 03/07/2020   Uncontrolled type 2 diabetes mellitus with hyperglycemia (HCC) 06/28/2018   Essential hypertension, benign 06/28/2018    Past Medical History:  Diagnosis Date   Diabetes mellitus type 2 in nonobese (HCC) 03/07/2020   Diabetes mellitus without complication (HCC)    Exertional dyspnea 03/07/2020   Hypertension    Pure hypercholesterolemia 03/07/2020   Tobacco abuse 03/07/2020    No past surgical history on file.  Social History   Socioeconomic History   Marital status: Married    Spouse name: Not on file   Number of children: Not on file   Years of education: Not on file   Highest education level: 12th grade  Occupational History   Not on file  Tobacco Use   Smoking status: Former    Current packs/day: 0.00    Average packs/day: 0.3 packs/day for 5.0 years (1.3 ttl pk-yrs)  Types: Cigarettes    Start date: 2017    Quit date: 2022    Years since quitting: 3.8   Smokeless tobacco: Never  Vaping Use   Vaping status: Never Used  Substance and Sexual Activity   Alcohol use: Yes    Comment: occasionally   Drug use: No   Sexual activity: Yes    Birth control/protection: None  Other Topics Concern   Not on file  Social History Narrative   Not on file   Social Drivers of Health   Financial Resource Strain: Low Risk  (05/15/2024)   Overall  Financial Resource Strain (CARDIA)    Difficulty of Paying Living Expenses: Not hard at all  Food Insecurity: No Food Insecurity (05/15/2024)   Hunger Vital Sign    Worried About Running Out of Food in the Last Year: Never true    Ran Out of Food in the Last Year: Never true  Recent Concern: Food Insecurity - Food Insecurity Present (03/31/2024)   Hunger Vital Sign    Worried About Running Out of Food in the Last Year: Never true    Ran Out of Food in the Last Year: Often true  Transportation Needs: No Transportation Needs (05/15/2024)   PRAPARE - Administrator, Civil Service (Medical): No    Lack of Transportation (Non-Medical): No  Physical Activity: Sufficiently Active (05/15/2024)   Exercise Vital Sign    Days of Exercise per Week: 3 days    Minutes of Exercise per Session: 50 min  Recent Concern: Physical Activity - Insufficiently Active (03/31/2024)   Exercise Vital Sign    Days of Exercise per Week: 3 days    Minutes of Exercise per Session: 30 min  Stress: No Stress Concern Present (05/15/2024)   [employer] of Occupational Health - Occupational Stress Questionnaire    Feeling of Stress: Not at all  Social Connections: Unknown (05/15/2024)   Social Connection and Isolation Panel    Frequency of Communication with Friends and Family: Twice a week    Frequency of Social Gatherings with Friends and Family: Once a week    Attends Religious Services: More than 4 times per year    Active Member of [employer] or Organizations: Patient declined    Attends Banker Meetings: Not on file    Marital Status: Patient declined  Intimate Partner Violence: Unknown (10/13/2021)   Received from [facility]   HITS    Physically Hurt: Not on file    Insult or Talk Down To: Not on file    Threaten Physical Harm: Not on file    Scream or Curse: Not on file    Family History  Problem Relation Age of Onset   Diabetes Mother    Diabetes Father    High blood pressure  Other    Hypertension Brother    Hypertension Sister    Diabetes Sister      Review of Systems  Constitutional: Negative.  Negative for chills and fever.  HENT: Negative.  Negative for congestion and sore throat.   Respiratory: Negative.  Negative for cough and shortness of breath.   Cardiovascular: Negative.  Negative for chest pain and palpitations.  Gastrointestinal:  Negative for abdominal pain, diarrhea, nausea and vomiting.  Genitourinary: Negative.  Negative for dysuria and hematuria.  Skin: Negative.  Negative for rash.  Neurological: Negative.  Negative for dizziness and headaches.  All other systems reviewed and are negative.   Vitals:   05/15/24 0937  BP:  128/84  Pulse: 86  Temp: 98.2 F (36.8 C)  SpO2: 99%    Physical Exam Vitals reviewed.  Constitutional:      Appearance: Normal appearance.  HENT:     Head: Normocephalic.     Mouth/Throat:     Mouth: Mucous membranes are moist.     Pharynx: Oropharynx is clear.  Eyes:     Extraocular Movements: Extraocular movements intact.     Conjunctiva/sclera: Conjunctivae normal.     Pupils: Pupils are equal, round, and reactive to light.  Cardiovascular:     Rate and Rhythm: Normal rate and regular rhythm.     Pulses: Normal pulses.     Heart sounds: Normal heart sounds.  Pulmonary:     Effort: Pulmonary effort is normal.     Breath sounds: Normal breath sounds.  Musculoskeletal:     Cervical back: No tenderness.     Lumbar back: Tenderness (Right paraspinous muscles) present.     Comments: Left knee: Mild medial tenderness.  Full range of motion.  Lymphadenopathy:     Cervical: No cervical adenopathy.  Skin:    General: Skin is warm and dry.     Capillary Refill: Capillary refill takes less than 2 seconds.  Neurological:     General: No focal deficit present.     Mental Status: He is alert and oriented to person, place, and time.  Psychiatric:        Mood and Affect: Mood normal.        Behavior:  Behavior normal.      ASSESSMENT & PLAN:  Problem List Items Addressed This Visit       Cardiovascular and Mediastinum   Hypertension associated with diabetes (HCC) - Primary   BP Readings from Last 3 Encounters:  05/15/24 128/84  04/25/24 131/82  04/04/24 122/82  Well-controlled hypertension Continue Zestoretic  20-12.5 mg daily Lab Results  Component Value Date   HGBA1C 8.2 (H) 02/08/2024  Continues glipizide  5 mg twice a day.  Not taking metformin . Cardiovascular risks associated with hypertension and diabetes discussed Diet and nutrition discussed Blood work done today Follow-up in 3 months        Relevant Orders   Comprehensive metabolic panel with GFR   CBC with Differential/Platelet   Hemoglobin A1c   Urinalysis   Microalbumin / creatinine urine ratio     Endocrine   Dyslipidemia associated with type 2 diabetes mellitus (HCC)   Atorvastatin  may be creating muscle aches Recommend to stop it for couple weeks We will reassess after that Muscle aches gone after stopping atorvastatin       Relevant Orders   Comprehensive metabolic panel with GFR   CBC with Differential/Platelet   Hemoglobin A1c   Lipid panel   Urinalysis   Microalbumin / creatinine urine ratio     Other   Multiple contusions   Status post recent injury Strained right lumbar muscles and also sprained left knee Pain management discussed Advised to contact the office if no better or worse after 2 to 3 weeks      Other Visit Diagnoses       Routine screening for STI (sexually transmitted infection)       Relevant Orders   GC/Chlamydia Probe Amp   RPR   HIV Antibody (routine testing w rflx)   Hepatitis C antibody      Patient Instructions  Health Maintenance, Male Adopting a healthy lifestyle and getting preventive care are important in promoting health and wellness. Ask your health care  provider about: The right schedule for you to have regular tests and exams. Things you can  do on your own to prevent diseases and keep yourself healthy. What should I know about diet, weight, and exercise? Eat a healthy diet  Eat a diet that includes plenty of vegetables, fruits, low-fat dairy products, and lean protein. Do not eat a lot of foods that are high in solid fats, added sugars, or sodium. Maintain a healthy weight Body mass index (BMI) is a measurement that can be used to identify possible weight problems. It estimates body fat based on height and weight. Your health care provider can help determine your BMI and help you achieve or maintain a healthy weight. Get regular exercise Get regular exercise. This is one of the most important things you can do for your health. Most adults should: Exercise for at least 150 minutes each week. The exercise should increase your heart rate and make you sweat (moderate-intensity exercise). Do strengthening exercises at least twice a week. This is in addition to the moderate-intensity exercise. Spend less time sitting. Even light physical activity can be beneficial. Watch cholesterol and blood lipids Have your blood tested for lipids and cholesterol at 50 years of age, then have this test every 5 years. You may need to have your cholesterol levels checked more often if: Your lipid or cholesterol levels are high. You are older than 50 years of age. You are at high risk for heart disease. What should I know about cancer screening? Many types of cancers can be detected early and may often be prevented. Depending on your health history and family history, you may need to have cancer screening at various ages. This may include screening for: Colorectal cancer. Prostate cancer. Skin cancer. Lung cancer. What should I know about heart disease, diabetes, and high blood pressure? Blood pressure and heart disease High blood pressure causes heart disease and increases the risk of stroke. This is more likely to develop in people who have high  blood pressure readings or are overweight. Talk with your health care provider about your target blood pressure readings. Have your blood pressure checked: Every 3-5 years if you are 64-41 years of age. Every year if you are 64 years old or older. If you are between the ages of 60 and 56 and are a current or former smoker, ask your health care provider if you should have a one-time screening for abdominal aortic aneurysm (AAA). Diabetes Have regular diabetes screenings. This checks your fasting blood sugar level. Have the screening done: Once every three years after age 72 if you are at a normal weight and have a low risk for diabetes. More often and at a younger age if you are overweight or have a high risk for diabetes. What should I know about preventing infection? Hepatitis B If you have a higher risk for hepatitis B, you should be screened for this virus. Talk with your health care provider to find out if you are at risk for hepatitis B infection. Hepatitis C Blood testing is recommended for: Everyone born from [birth year] through 1967. Anyone with known risk factors for hepatitis C. Sexually transmitted infections (STIs) You should be screened each year for STIs, including gonorrhea and chlamydia, if: You are sexually active and are younger than 50 years of age. You are older than 50 years of age and your health care provider tells you that you are at risk for this type of infection. Your sexual activity has changed since  you were last screened, and you are at increased risk for chlamydia or gonorrhea. Ask your health care provider if you are at risk. Ask your health care provider about whether you are at high risk for HIV. Your health care provider may recommend a prescription medicine to help prevent HIV infection. If you choose to take medicine to prevent HIV, you should first get tested for HIV. You should then be tested every 3 months for as long as you are taking the medicine. Follow  these instructions at home: Alcohol use Do not drink alcohol if your health care provider tells you not to drink. If you drink alcohol: Limit how much you have to 0-2 drinks a day. Know how much alcohol is in your drink. In the [place] one drink equals one 12 oz bottle of beer (355 mL), one 5 oz glass of wine (148 mL), or one 1 oz glass of hard liquor (44 mL). Lifestyle Do not use any products that contain nicotine or tobacco. These products include cigarettes, chewing tobacco, and vaping devices, such as e-cigarettes. If you need help quitting, ask your health care provider. Do not use street drugs. Do not share needles. Ask your health care provider for help if you need support or information about quitting drugs. General instructions Schedule regular health, dental, and eye exams. Stay current with your vaccines. Tell your health care provider if: You often feel depressed. You have ever been abused or do not feel safe at home. Summary Adopting a healthy lifestyle and getting preventive care are important in promoting health and wellness. Follow your health care provider's instructions about healthy diet, exercising, and getting tested or screened for diseases. Follow your health care provider's instructions on monitoring your cholesterol and blood pressure. This information is not intended to replace advice given to you by your health care provider. Make sure you discuss any questions you have with your health care provider. Document Revised: 11/18/2020 Document Reviewed: 11/18/2020 Elsevier Patient Education  2024 Elsevier Inc.    Emil Schaumann, MD  Primary Care at [facility]

## 2024-05-15 NOTE — Assessment & Plan Note (Signed)
 Status post recent injury Strained right lumbar muscles and also sprained left knee Pain management discussed Advised to contact the office if no better or worse after 2 to 3 weeks

## 2024-05-15 NOTE — Assessment & Plan Note (Signed)
 BP Readings from Last 3 Encounters:  05/15/24 128/84  04/25/24 131/82  04/04/24 122/82  Well-controlled hypertension Continue Zestoretic  20-12.5 mg daily Lab Results  Component Value Date   HGBA1C 8.2 (H) 02/08/2024  Continues glipizide  5 mg twice a day.  Not taking metformin . Cardiovascular risks associated with hypertension and diabetes discussed Diet and nutrition discussed Blood work done today Follow-up in 3 months

## 2024-05-16 ENCOUNTER — Ambulatory Visit: Payer: Self-pay | Admitting: Emergency Medicine

## 2024-05-16 ENCOUNTER — Other Ambulatory Visit (HOSPITAL_COMMUNITY): Payer: Self-pay

## 2024-05-16 ENCOUNTER — Other Ambulatory Visit: Payer: Self-pay | Admitting: Emergency Medicine

## 2024-05-16 DIAGNOSIS — E1169 Type 2 diabetes mellitus with other specified complication: Secondary | ICD-10-CM

## 2024-05-16 DIAGNOSIS — I152 Hypertension secondary to endocrine disorders: Secondary | ICD-10-CM

## 2024-05-16 MED ORDER — DAPAGLIFLOZIN PROPANEDIOL 10 MG PO TABS
10.0000 mg | ORAL_TABLET | Freq: Every day | ORAL | 3 refills | Status: DC
  Filled 2024-05-16: qty 30, 30d supply, fill #0

## 2024-05-16 MED ORDER — METFORMIN HCL 500 MG PO TABS
500.0000 mg | ORAL_TABLET | Freq: Two times a day (BID) | ORAL | 3 refills | Status: DC
  Filled 2024-05-16: qty 180, 90d supply, fill #0

## 2024-05-17 LAB — GC/CHLAMYDIA PROBE AMP
Chlamydia trachomatis, NAA: NEGATIVE
Neisseria Gonorrhoeae by PCR: NEGATIVE

## 2024-05-18 ENCOUNTER — Telehealth: Payer: Self-pay | Admitting: *Deleted

## 2024-05-18 NOTE — Progress Notes (Signed)
 Care Guide Pharmacy Note  05/18/2024 Name: Adam Wiley MRN: [id number] DOB: February 01, 1976  Referred By: Purcell Emil Schanz, MD Reason for referral: Call Attempt #1 and Complex Care Management (Outreach to schedule referral with pharmacist )   Adam Wiley is a 48 y.o. year old male who is a primary care patient of Sagardia, Miguel Jose, MD.  Adam Wiley was referred to the pharmacist for assistance related to: HLD and DMII  An unsuccessful telephone outreach was attempted today to contact the patient who was referred to the pharmacy team for assistance with medication management. Additional attempts will be made to contact the patient.  Adam Wiley, CMA [place]  [facility], [facility] Guide Direct Dial: [phone]  Fax: [phone] Website: [email or web address]

## 2024-05-19 LAB — RPR: RPR Ser Ql: NONREACTIVE

## 2024-05-19 LAB — HIV ANTIBODY (ROUTINE TESTING W REFLEX)
HIV 1&2 Ab, 4th Generation: NONREACTIVE
HIV FINAL INTERPRETATION: NEGATIVE

## 2024-05-19 LAB — HEPATITIS C ANTIBODY: Hepatitis C Ab: NONREACTIVE

## 2024-05-22 ENCOUNTER — Telehealth: Payer: Self-pay | Admitting: *Deleted

## 2024-05-22 NOTE — Progress Notes (Signed)
 Opened in error

## 2024-05-22 NOTE — Progress Notes (Signed)
 Care Guide Pharmacy Note  05/22/2024 Name: Adam Wiley MRN: [id number] DOB: 1976-06-24  Referred By: Purcell Emil Schanz, MD Reason for referral: Call Attempt #1 and Complex Care Management (Outreach to schedule referral with pharmacist )   Adam Wiley is a 48 y.o. year old male who is a primary care patient of Sagardia, Miguel Jose, MD.  [name] was referred to the pharmacist for assistance related to: HLD and DMII  Successful contact was made with the patient to discuss pharmacy services including being ready for the pharmacist to call at least 5 minutes before the scheduled appointment time and to have medication bottles and any blood pressure readings ready for review. The patient agreed to meet with the pharmacist via telephone visit on 05/29/2024  Thedford Franks, CMA [place]  [facility], [facility] Guide Direct Dial: [phone]  Fax: [phone] Website: [email or web address]

## 2024-05-24 ENCOUNTER — Other Ambulatory Visit (HOSPITAL_COMMUNITY): Payer: Self-pay

## 2024-05-29 ENCOUNTER — Other Ambulatory Visit: Payer: Self-pay

## 2024-05-29 ENCOUNTER — Telehealth: Payer: Self-pay | Admitting: Pharmacist

## 2024-05-29 NOTE — Telephone Encounter (Signed)
 Called for scheduled pharmacist telephone appointment. No answer x2, left voicemail with direct call back number.  Darrelyn Drum, PharmD, BCPS, CPP Clinical Pharmacist Practitioner [place] [facility] [phone]

## 2024-06-12 ENCOUNTER — Other Ambulatory Visit (HOSPITAL_COMMUNITY): Payer: Self-pay

## 2024-06-12 ENCOUNTER — Ambulatory Visit
Admission: EM | Admit: 2024-06-12 | Discharge: 2024-06-12 | Disposition: A | Discharge status: Home / Self Care | Payer: Self-pay

## 2024-06-12 DIAGNOSIS — R1030 Lower abdominal pain, unspecified: Secondary | ICD-10-CM | POA: Insufficient documentation

## 2024-06-12 DIAGNOSIS — R300 Dysuria: Secondary | ICD-10-CM | POA: Insufficient documentation

## 2024-06-12 DIAGNOSIS — N342 Other urethritis: Secondary | ICD-10-CM | POA: Insufficient documentation

## 2024-06-12 DIAGNOSIS — Z113 Encounter for screening for infections with a predominantly sexual mode of transmission: Secondary | ICD-10-CM | POA: Insufficient documentation

## 2024-06-12 LAB — POCT URINE DIPSTICK
Bilirubin, UA: NEGATIVE
Glucose, UA: NEGATIVE mg/dL
Ketones, POC UA: NEGATIVE mg/dL
Leukocytes, UA: NEGATIVE
Nitrite, UA: NEGATIVE
POC PROTEIN,UA: 30 — AB
Spec Grav, UA: >=1.03 — AB (ref 1.010–1.025)
Urobilinogen, UA: 0.2 U/dL
pH, UA: 5.5 (ref 5.0–8.0)

## 2024-06-12 MED ORDER — CEFTRIAXONE SODIUM 500 MG IJ SOLR
500.0000 mg | INTRAMUSCULAR | Status: DC
  Administered 2024-06-12: 500 mg via INTRAMUSCULAR

## 2024-06-12 MED ORDER — DOXYCYCLINE HYCLATE 100 MG PO TABS
100.0000 mg | ORAL_TABLET | Freq: Two times a day (BID) | ORAL | 0 refills | Status: AC
  Filled 2024-06-12: qty 14, 7d supply, fill #0

## 2024-06-12 NOTE — ED Triage Notes (Signed)
 Patient reports feeling testicular tenderness, sometimes pain. No obvious injury known. Recent visit with PCP and blood work done. Since then has had possible exposure condom broke. No dysuria. No urinary complaints.

## 2024-06-12 NOTE — Discharge Instructions (Signed)
 We are treating the you for urethritis with intramuscular Rocephin  and oral doxycycline.  Take the antibiotics twice daily with food for the next 7 days.  Staff will contact you if we need to modify your treatment plan.  Same from intercourse until all results have been received.  Ensure you are drinking at least 64 ounces of water daily to help flush the kidneys.  Return to clinic for any new or urgent symptoms.

## 2024-06-12 NOTE — ED Provider Notes (Signed)
 [facility]    CSN: [id number] Arrival date & time: 06/12/24  9157      History   Chief Complaint Chief Complaint  Patient presents with   Groin Pain    HPI Adam Wiley is a 48 y.o. male.   Patient presents to clinic over concern of dysuria and testicular discomfort. Reports when he ejaculates he will have pain and discomfort. Did recently have negative STI screening, had intercourse after he got these results, condom broke. Has not had penile discharge. Has had lower abdominal discomfort.   Denies known exposure to STI. Denies genital sores or lesions. Denies testicular swelling.   The history is provided by the patient and medical records.  Groin Pain    Past Medical History:  Diagnosis Date   Diabetes mellitus type 2 in nonobese (HCC) 03/07/2020   Diabetes mellitus without complication (HCC)    Exertional dyspnea 03/07/2020   Hypertension    Pure hypercholesterolemia 03/07/2020   Tobacco abuse 03/07/2020    Patient Active Problem List   Diagnosis Date Noted   Multiple contusions 05/15/2024   TMJ (temporomandibular joint syndrome) 02/08/2024   Hypertension associated with diabetes (HCC) 02/10/2023   Dyslipidemia associated with type 2 diabetes mellitus (HCC) 02/10/2023   Pure hypercholesterolemia 03/07/2020   Uncontrolled type 2 diabetes mellitus with hyperglycemia (HCC) 06/28/2018   Essential hypertension, benign 06/28/2018    History reviewed. No pertinent surgical history.     Home Medications    Prior to Admission medications   Medication Sig Start Date End Date Taking? Authorizing Provider  doxycycline (VIBRA-TABS) 100 MG tablet Take 1 tablet (100 mg total) by mouth 2 (two) times daily for 7 days. 06/12/24 06/19/24 Yes Sabrinia Prien  N, FNP  lisinopril -hydrochlorothiazide  (ZESTORETIC ) 20-12.5 MG tablet Take 1 tablet by mouth daily. 03/27/24  Yes Sagardia, Emil Schanz, MD  metFORMIN  (GLUCOPHAGE ) 500 MG tablet Take 1 tablet (500 mg total)  by mouth 2 (two) times daily with a meal. 05/16/24  Yes Sagardia, Emil Schanz, MD  methylPREDNISolone  (MEDROL ) 4 MG tablet as directed orally qd; Duration: 6 days 09/28/18  Yes [provider]  Multiple Vitamins-Minerals (CENTRUM PO) Take by mouth.   Yes [provider]  cetirizine  (ZYRTEC  ALLERGY) 10 MG tablet Take 1 tablet (10 mg total) by mouth daily. 06/12/23   Rising, Asberry, PA-C  clotrimazole -betamethasone  (LOTRISONE ) cream Apply 1 Application topically daily. 08/09/23   Purcell Emil Schanz, MD  esomeprazole  (NEXIUM ) 40 MG capsule TAKE 1 CAPSULE BY MOUTH DAILY 12/14/22 05/15/24  Jarold Medici, MD  fluticasone  (FLONASE ) 50 MCG/ACT nasal spray Place 2 sprays into both nostrils daily. 06/12/23   Rising, Asberry, PA-C  glipiZIDE  (GLUCOTROL ) 5 MG tablet Take 1 tablet (5 mg total) by mouth 2 (two) times daily before a meal. 04/04/24   Sagardia, Emil Schanz, MD    Family History Family History  Problem Relation Age of Onset   Diabetes Mother    Diabetes Father    Hypertension Sister    Diabetes Sister    Hypertension Brother    High blood pressure Other     Social History Social History   Tobacco Use   Smoking status: Former    Current packs/day: 0.00    Average packs/day: 0.3 packs/day for 5.0 years (1.3 ttl pk-yrs)    Types: Cigarettes    Start date: 2017    Quit date: 2022    Years since quitting: 3.9   Smokeless tobacco: Never  Vaping Use   Vaping status: Never Used  Substance Use Topics   Alcohol use: Yes    Comment: occasionally   Drug use: No     Allergies   Patient has no known allergies.   Review of Systems Review of Systems  Per HPI  Physical Exam Triage Vital Signs ED Triage Vitals  Encounter Vitals Group     BP 06/12/24 0904 (!) 169/106     Girls Systolic BP Percentile --      Girls Diastolic BP Percentile --      Boys Systolic BP Percentile --      Boys Diastolic BP Percentile --      Pulse Rate 06/12/24 0904 84     Resp  06/12/24 0904 18     Temp 06/12/24 0904 97.7 F (36.5 C)     Temp Source 06/12/24 0904 Oral     SpO2 06/12/24 0904 99 %     Weight 06/12/24 0902 177 lb 14.6 oz (80.7 kg)     Height 06/12/24 0902 6' (1.829 m)     Head Circumference --      Peak Flow --      Pain Score 06/12/24 0858 0     Pain Loc --      Pain Education --      Exclude from Growth Chart --    No data found.  Updated Vital Signs BP (!) 169/106 (BP Location: Left Arm)   Pulse 84   Temp 97.7 F (36.5 C) (Oral)   Resp 18   Ht 6' (1.829 m)   Wt 177 lb 14.6 oz (80.7 kg)   SpO2 99%   BMI 24.13 kg/m   Visual Acuity Right Eye Distance:   Left Eye Distance:   Bilateral Distance:    Right Eye Near:   Left Eye Near:    Bilateral Near:     Physical Exam Vitals and nursing note reviewed. Exam conducted with a chaperone present.  Constitutional:      Appearance: Normal appearance.  HENT:     Head: Normocephalic and atraumatic.     Right Ear: External ear normal.     Left Ear: External ear normal.     Nose: Nose normal.     Mouth/Throat:     Mouth: Mucous membranes are moist.  Eyes:     Conjunctiva/sclera: Conjunctivae normal.  Cardiovascular:     Rate and Rhythm: Normal rate.  Pulmonary:     Effort: Pulmonary effort is normal. No respiratory distress.  Genitourinary:    Penis: Normal and circumcised.      Testes: Normal.        Right: Mass or swelling not present.        Left: Mass or swelling not present.  Skin:    General: Skin is warm and dry.  Neurological:     General: No focal deficit present.     Mental Status: He is alert and oriented to person, place, and time.  Psychiatric:        Mood and Affect: Mood normal.        Behavior: Behavior normal. Behavior is cooperative.      UC Treatments / Results  Labs (all labs ordered are listed, but only abnormal results are displayed) Labs Reviewed  POCT URINE DIPSTICK - Abnormal; Notable for the following components:      Result Value   Spec  Grav, UA >=1.030 (*)    Blood, UA trace-intact (*)    POC PROTEIN,UA =30 (*)    All other components within  normal limits  HIV ANTIBODY (ROUTINE TESTING W REFLEX)  SYPHILIS: RPR W/REFLEX TO RPR TITER AND TREPONEMAL ANTIBODIES, TRADITIONAL SCREENING AND DIAGNOSIS ALGORITHM  CYTOLOGY, (ORAL, ANAL, URETHRAL) ANCILLARY ONLY    EKG   Radiology No results found.  Procedures Procedures (including critical care time)  Medications Ordered in UC Medications  cefTRIAXone  (ROCEPHIN ) injection 500 mg (500 mg Intramuscular Given 06/12/24 0954)    Initial Impression / Assessment and Plan / UC Course  I have reviewed the triage vital signs and the nursing notes.  Pertinent labs & imaging results that were available during my care of the patient were reviewed by me and considered in my medical decision making (see chart for details).  Vitals and triage reviewed, patient is hemodynamically stable.  Chaperone present for GU exam reveals circumcised penis without lesions or discharge.  Bilateral testicles without swelling.  Mild tenderness to palpation.  Low concern for torsion, mild discomfort.  UA with trace red blood cells. Cytology swab obtained.  Will treat for urethritis with Rocephin  and doxycycline, gonorrhea and chlamydia coverage.  Staff will contact if treatment modification is needed.  Plan of care, follow-up care return precautions given, no questions at this time.    Final Clinical Impressions(s) / UC Diagnoses   Final diagnoses:  Dysuria  Encounter for screening examination for sexually transmitted infection  Urethritis     Discharge Instructions      We are treating the you for urethritis with intramuscular Rocephin  and oral doxycycline.  Take the antibiotics twice daily with food for the next 7 days.  Staff will contact you if we need to modify your treatment plan.  Same from intercourse until all results have been received.  Ensure you are drinking at least 64  ounces of water daily to help flush the kidneys.  Return to clinic for any new or urgent symptoms.     ED Prescriptions     Medication Sig Dispense Auth. Provider   doxycycline (VIBRA-TABS) 100 MG tablet Take 1 tablet (100 mg total) by mouth 2 (two) times daily for 7 days. 14 tablet Dreama, Raguel Kosloski  N, FNP      PDMP not reviewed this encounter.   Dreama Juel SAILOR, FNP 06/12/24 1137

## 2024-06-13 LAB — CYTOLOGY, (ORAL, ANAL, URETHRAL) ANCILLARY ONLY
Chlamydia: NEGATIVE
Comment: NEGATIVE
Comment: NEGATIVE
Comment: NORMAL
Neisseria Gonorrhea: NEGATIVE
Trichomonas: NEGATIVE

## 2024-06-13 LAB — HIV ANTIBODY (ROUTINE TESTING W REFLEX): HIV Screen 4th Generation wRfx: NONREACTIVE

## 2024-06-13 LAB — SYPHILIS: RPR W/REFLEX TO RPR TITER AND TREPONEMAL ANTIBODIES, TRADITIONAL SCREENING AND DIAGNOSIS ALGORITHM: RPR Ser Ql: NONREACTIVE

## 2024-06-19 ENCOUNTER — Other Ambulatory Visit: Payer: Self-pay

## 2024-06-19 NOTE — Telephone Encounter (Unsigned)
 Copied from CRM [id number]. Topic: Appointments - Scheduling Inquiry for Clinic >> Jun 19, 2024 11:18 AM Anairis L wrote: Reason for CRM: Patient is calling back trying to get in contact with kristy for app today at 11 am with Pharmacist out reach

## 2024-06-22 ENCOUNTER — Ambulatory Visit
Admission: EM | Admit: 2024-06-22 | Discharge: 2024-06-22 | Disposition: A | Discharge status: Home / Self Care | Payer: Self-pay | Attending: Family Medicine | Admitting: Family Medicine

## 2024-06-22 ENCOUNTER — Other Ambulatory Visit (HOSPITAL_COMMUNITY): Payer: Self-pay

## 2024-06-22 ENCOUNTER — Encounter: Payer: Self-pay | Admitting: Emergency Medicine

## 2024-06-22 DIAGNOSIS — L600 Ingrowing nail: Secondary | ICD-10-CM

## 2024-06-22 MED ORDER — CEPHALEXIN 500 MG PO CAPS
500.0000 mg | ORAL_CAPSULE | Freq: Three times a day (TID) | ORAL | 0 refills | Status: AC
  Filled 2024-06-22: qty 21, 7d supply, fill #0

## 2024-06-22 NOTE — Discharge Instructions (Signed)
 You were seen today for an infection to the nail after a pedicure.  I have sent out an oral antibiotic to take for the next week.  I also recommend warm epson salt soaks. Avoid wearing tight boots if helpful.  If not improving please return.  You may also wish to see a podiatrist if this continues to be an issue.

## 2024-06-22 NOTE — ED Provider Notes (Signed)
 [facility]    CSN: [id number] Arrival date & time: 06/22/24  0836      History   Chief Complaint Chief Complaint  Patient presents with   Toe Pain    HPI Adam Wiley is a 48 y.o. male.    Toe Pain  Patient is here for pain to the right big toe.  He had a pedicure and after that started noting pain and swelling around the nail.  It was worse last night.  This has not happened before.        Past Medical History:  Diagnosis Date   Diabetes mellitus type 2 in nonobese (HCC) 03/07/2020   Diabetes mellitus without complication (HCC)    Exertional dyspnea 03/07/2020   Hypertension    Pure hypercholesterolemia 03/07/2020   Tobacco abuse 03/07/2020    Patient Active Problem List   Diagnosis Date Noted   Multiple contusions 05/15/2024   TMJ (temporomandibular joint syndrome) 02/08/2024   Hypertension associated with diabetes (HCC) 02/10/2023   Dyslipidemia associated with type 2 diabetes mellitus (HCC) 02/10/2023   Pure hypercholesterolemia 03/07/2020   Uncontrolled type 2 diabetes mellitus with hyperglycemia (HCC) 06/28/2018   Essential hypertension, benign 06/28/2018    History reviewed. No pertinent surgical history.     Home Medications    Prior to Admission medications  Medication Sig Start Date End Date Taking? Authorizing Provider  lisinopril -hydrochlorothiazide  (ZESTORETIC ) 20-12.5 MG tablet Take 1 tablet by mouth daily. 03/27/24  Yes Sagardia, Emil Schanz, MD  metFORMIN  (GLUCOPHAGE ) 500 MG tablet Take 1 tablet (500 mg total) by mouth 2 (two) times daily with a meal. 05/16/24  Yes Sagardia, Emil Schanz, MD  Multiple Vitamins-Minerals (CENTRUM PO) Take by mouth.   Yes [provider]  cetirizine  (ZYRTEC  ALLERGY) 10 MG tablet Take 1 tablet (10 mg total) by mouth daily. 06/12/23   Rising, Asberry, PA-C  clotrimazole -betamethasone  (LOTRISONE ) cream Apply 1 Application topically daily. Patient not taking: Reported on 06/22/2024 08/09/23    Purcell Emil Schanz, MD  esomeprazole  (NEXIUM ) 40 MG capsule TAKE 1 CAPSULE BY MOUTH DAILY Patient not taking: Reported on 06/22/2024 12/14/22 05/15/24  Jarold Medici, MD  fluticasone  (FLONASE ) 50 MCG/ACT nasal spray Place 2 sprays into both nostrils daily. 06/12/23   Rising, Asberry, PA-C  glipiZIDE  (GLUCOTROL ) 5 MG tablet Take 1 tablet (5 mg total) by mouth 2 (two) times daily before a meal. Patient not taking: Reported on 06/22/2024 04/04/24   Sagardia, Miguel Jose, MD  methylPREDNISolone  (MEDROL ) 4 MG tablet as directed orally qd; Duration: 6 days Patient not taking: Reported on 06/22/2024 09/28/18   [provider]    Family History Family History  Problem Relation Age of Onset   Diabetes Mother    Diabetes Father    Hypertension Sister    Diabetes Sister    Hypertension Brother    High blood pressure Other     Social History Social History[1]   Allergies   Patient has no known allergies.   Review of Systems Review of Systems  Constitutional: Negative.   HENT: Negative.    Respiratory: Negative.    Cardiovascular: Negative.   Gastrointestinal: Negative.      Physical Exam Triage Vital Signs ED Triage Vitals  Encounter Vitals Group     BP 06/22/24 0856 (!) 148/97     Girls Systolic BP Percentile --      Girls Diastolic BP Percentile --      Boys Systolic BP Percentile --      Boys Diastolic  BP Percentile --      Pulse Rate 06/22/24 0856 92     Resp 06/22/24 0856 14     Temp 06/22/24 0856 97.9 F (36.6 C)     Temp Source 06/22/24 0856 Oral     SpO2 06/22/24 0856 98 %     Weight --      Height --      Head Circumference --      Peak Flow --      Pain Score 06/22/24 0855 8     Pain Loc --      Pain Education --      Exclude from Growth Chart --    No data found.  Updated Vital Signs BP (!) 148/97 (BP Location: Left Arm) Comment: has not taken bp meds yet  Pulse 92   Temp 97.9 F (36.6 C) (Oral)   Resp 14   SpO2 98%   Visual  Acuity Right Eye Distance:   Left Eye Distance:   Bilateral Distance:    Right Eye Near:   Left Eye Near:    Bilateral Near:     Physical Exam Constitutional:      Appearance: Normal appearance. He is normal weight.  Skin:    Comments: At the right big toenail, medial and inferior aspect is red, swollen;  no drainage noted;  the nail appears ingrown  Neurological:     Mental Status: He is alert.      UC Treatments / Results  Labs (all labs ordered are listed, but only abnormal results are displayed) Labs Reviewed - No data to display  EKG   Radiology No results found.  Procedures Procedures (including critical care time)  Medications Ordered in UC Medications - No data to display  Initial Impression / Assessment and Plan / UC Course  I have reviewed the triage vital signs and the nursing notes.  Pertinent labs & imaging results that were available during my care of the patient were reviewed by me and considered in my medical decision making (see chart for details).    Final Clinical Impressions(s) / UC Diagnoses   Final diagnoses:  Ingrown toenail of right foot     Discharge Instructions      You were seen today for an infection to the nail after a pedicure.  I have sent out an oral antibiotic to take for the next week.  I also recommend warm epson salt soaks. Avoid wearing tight boots if helpful.  If not improving please return.  You may also wish to see a podiatrist if this continues to be an issue.     ED Prescriptions     Medication Sig Dispense Auth. Provider   cephALEXin  (KEFLEX ) 500 MG capsule Take 1 capsule (500 mg total) by mouth 3 (three) times daily for 7 days. 21 capsule Darral Longs, MD      PDMP not reviewed this encounter.    [1]  Social History Tobacco Use   Smoking status: Former    Current packs/day: 0.00    Average packs/day: 0.3 packs/day for 5.0 years (1.3 ttl pk-yrs)    Types: Cigarettes    Start date: 2017    Quit  date: 2022    Years since quitting: 3.9   Smokeless tobacco: Never  Vaping Use   Vaping status: Never Used  Substance Use Topics   Alcohol use: Yes    Comment: occasionally   Drug use: No     Darral Longs, MD 06/22/24 [phone]

## 2024-06-22 NOTE — ED Triage Notes (Signed)
 Pt reports R great toe pain x6 days after receiving a pedicure last weekend. Swelling around bottom of toenail and throbbing pain on medial side of toe. Aggravated by walking and bending toe. No med use for symptoms - no creams applied to area. Pt has T2DM.

## 2024-06-26 ENCOUNTER — Ambulatory Visit: Payer: Self-pay | Admitting: Physician Assistant

## 2024-06-26 DIAGNOSIS — L03031 Cellulitis of right toe: Secondary | ICD-10-CM

## 2024-06-27 ENCOUNTER — Other Ambulatory Visit: Payer: Self-pay

## 2024-06-27 ENCOUNTER — Encounter: Payer: Self-pay | Admitting: Orthopedic Surgery

## 2024-06-27 DIAGNOSIS — E1169 Type 2 diabetes mellitus with other specified complication: Secondary | ICD-10-CM

## 2024-06-27 DIAGNOSIS — E1165 Type 2 diabetes mellitus with hyperglycemia: Secondary | ICD-10-CM

## 2024-06-27 MED ORDER — SYNJARDY XR 12.5-1000 MG PO TB24: 1.0000 | ORAL_TABLET | Freq: Every day | ORAL | Status: DC

## 2024-06-27 MED ORDER — ROSUVASTATIN CALCIUM 10 MG PO TABS: 10.0000 mg | ORAL_TABLET | Freq: Every day | ORAL | Status: DC

## 2024-06-27 NOTE — Patient Instructions (Signed)
 It was a pleasure speaking with you today!  We will plan to start Synjardy  and rosuvastatin  in the new year.  I will follow up with you 07/18/24 on these medication changes.  Feel free to call with any questions or concerns!  Darrelyn Drum, PharmD, BCPS, CPP Clinical Pharmacist Practitioner [facility] [phone]

## 2024-06-27 NOTE — Progress Notes (Signed)
 06/27/2024 Name: Adam Wiley MRN: [id number] DOB: [birth year]  Chief Complaint  Patient presents with   Diabetes   Hyperlipidemia   Medication Management    Adam Wiley is a 48 y.o. year old male who presented for a telephone visit.   They were referred to the pharmacist by their PCP for assistance in managing diabetes and hyperlipidemia/cardiovascular risk reduction.   Subjective:  Care Team: Primary Care Provider: Purcell Emil Schanz, MD ; Next Scheduled Visit: 07/10/24  Medication Access/Adherence  Current Pharmacy:  [facility] - [facility] [address], [address] [place] [address] Phone: [phone] Fax: [phone]  Adam Wiley - [facility] [address] [place] [address] Phone: [phone] Fax: [phone]  [id number] - [place], [place] - [address] [place] [address] Phone: [phone] Fax: [phone]   Patient reports affordability concerns with their medications: Yes  Patient reports access/transportation concerns to their pharmacy: No  Patient reports adherence concerns with their medications:  No    Should be getting insurance starting 07/13/24  Diabetes:  Current medications: metformin  500 mg BID Medications tried in the past: glipizide  (ineffective), Synjardy  (cost), Kombiglyze   *BG were well controlled on Synjardy  12.11/998 mg daily but pt had to stop due to losing insurance coverage and cost being too high  Current meal patterns:  Breakfast: oatmeal with walnuts, drizzle honey, maybe banana Lunch: salad with chicken  Dinner: grilled chicken with asparagus, sometimes wheat bread 64 oz water  Current physical activity: regular exercise  Macrovascular and Microvascular Risk Reduction:  Statin? no; patient previously intolerant to statin therapy; ACEi/ARB? yes (lisinopril /HCTZ) Last urinary albumin/creatinine ratio:  Lab Results   Component Value Date   MICRALBCREAT 34.6 (H) 05/15/2024   MICRALBCREAT 5 03/20/2021   MICRALBCREAT <30 12/19/2019   MICRALBCREAT 30 06/28/2018   Last eye exam:  Lab Results  Component Value Date   HMDIABEYEEXA No Retinopathy 08/12/2021   Last foot exam: No foot exam found Tobacco Use:  Tobacco Use: Medium Risk (06/27/2024)   Patient History    Smoking Tobacco Use: Former    Smokeless Tobacco Use: Never    Passive Exposure: Not on file   Hyperlipidemia/ASCVD Risk Reduction  Current lipid lowering medications: none currently Medications tried in the past: atorvastatin  40 mg (muscle/joint pain)   PREVENT Risk Score:  omesothelioma.fr - 10 year risk of CVD: 10.3% - 10 year risk of ASCVD: 6.5% - 10 year risk of HF: 4.6%  The 10-year ASCVD risk score (Arnett DK, et al., 2019) is: 21.2%   Values used to calculate the score:     Age: 46 years     Clinically relevant sex: Male     Is Non-Hispanic African American: Yes     Diabetic: Yes     Tobacco smoker: No     Systolic Blood Pressure: 148 mmHg     Is BP treated: Yes     HDL Cholesterol: 50.8 mg/dL     Total Cholesterol: 225 mg/dL    Objective:  Lab Results  Component Value Date   HGBA1C 8.3 (H) 05/15/2024    Lab Results  Component Value Date   CREATININE 1.11 05/15/2024   BUN 15 05/15/2024   NA 139 05/15/2024   K 4.6 05/15/2024   CL 104 05/15/2024   CO2 30 05/15/2024    Lab Results  Component Value Date   CHOL 225 (H) 05/15/2024   HDL 50.80 05/15/2024   LDLCALC  140 (H) 05/15/2024   LDLDIRECT 146.0 02/17/2023   TRIG 168.0 (H) 05/15/2024   CHOLHDL 4 05/15/2024    Medications Reviewed Today     Reviewed by Merceda Lela SAUNDERS, [facility] (Pharmacist) on 06/27/24 at 1114  Med List Status: <None>   Medication Order Taking? Sig Documenting Provider Last Dose Status Informant  cephALEXin  (KEFLEX ) 500 MG capsule [id number]   Take 1 capsule (500 mg total) by mouth 3 (three) times daily for 7 days. Darral Longs, MD  Active   cetirizine  (ZYRTEC  ALLERGY) 10 MG tablet [id number]  Take 1 tablet (10 mg total) by mouth daily. Rising, Asberry, PA-C  Active   clotrimazole -betamethasone  (LOTRISONE ) cream [id number]  Apply 1 Application topically daily.  Patient not taking: Reported on 06/22/2024   Sagardia, Miguel Jose, MD  Active   esomeprazole  (NEXIUM ) 40 MG capsule [id number]  TAKE 1 CAPSULE BY MOUTH DAILY  Patient not taking: Reported on 06/22/2024   Jarold Medici, MD  Expired 05/15/24 2359   fluticasone  (FLONASE ) 50 MCG/ACT nasal spray [id number]  Place 2 sprays into both nostrils daily. Rising, Asberry RIGGERS  Active    Patient not taking:   Discontinued 06/27/24 1114   lisinopril -hydrochlorothiazide  (ZESTORETIC ) 20-12.5 MG tablet [id number]  Take 1 tablet by mouth daily. Sagardia, Miguel Jose, MD  Active   metFORMIN  (GLUCOPHAGE ) 500 MG tablet [id number] Yes Take 1 tablet (500 mg total) by mouth 2 (two) times daily with a meal. Adam Emil Schanz, MD  Active   methylPREDNISolone  (MEDROL ) 4 MG tablet [id number]  as directed orally qd; Duration: 6 days  Patient not taking: Reported on 06/22/2024   [provider]  Active   Multiple Vitamins-Minerals (CENTRUM PO) [id number]  Take by mouth. [provider]  Active               Assessment/Plan:   Diabetes: - Currently uncontrolled; goal A1c <7%. Cardiorenal risk reduction is opportunities for improvement.. Blood pressure is at goal <130/80. LDL is not at goal.  - Reviewed Wiley term cardiovascular and renal outcomes of uncontrolled blood sugar. and Reviewed dietary modifications including increased protein/fiber, moderation, balanced meals/snacks. - Recommend to restart Synjardy  XR 12.11/998 mg daily once insurance coverage starts in January since this regimen worked well previously. Will send Rx after 07/13/24 to see if cost is affordable. - Continue  metformin  for now with plan to change after 1/1  Hyperlipidemia/ASCVD Risk Reduction: - Currently uncontrolled. LDL goal <70  - Reviewed Wiley term complications of uncontrolled cholesterol - Recommend to start rosuvastatin  10 mg daily (pt prefers to wait to send after 1/1)   Follow Up Plan: 07/19/23  Darrelyn Merceda, PharmD, BCPS, CPP Clinical Pharmacist Practitioner [facility] at [facility] [phone]

## 2024-06-27 NOTE — Progress Notes (Signed)
 Office Visit Note   Patient: Adam Wiley           Date of Birth: 1975/08/05           MRN: [id number] Visit Date: 06/26/2024              Requested by: Purcell Emil Schanz, MD [address] [place],  [place] [address] PCP: Purcell Emil Schanz, MD  Chief Complaint  Patient presents with   Right Foot - Pain    Big toe infection      HPI: Discussed the use of AI scribe software for clinical note transcription with the patient, who gave verbal consent to proceed.  History of Present Illness  Patient is a 48 year old gentleman with history of type 2 diabetes who presents with acute pain swelling right great toe.  Patient states that this started after having a pedicure.  Patient went to urgent care on December 11 he was started on Keflex .  Patient complains of pain swelling throbbing.    Assessment & Plan: Visit Diagnoses:  1. Paronychia of great toe of right foot     Plan: Assessment and Plan Assessment & Plan  Nail was excised and a sterile dressing was applied.  Patient will begin wound care with Dial soap cleansing and antibiotic ointment dressing changes tomorrow.  Continue antibiotics.     Follow-Up Instructions: Return in about 1 week (around 07/03/2024).   Ortho Exam  Patient is alert, oriented, no adenopathy, well-dressed, normal affect, normal respiratory effort. Physical Exam  Examination patient has a palpable dorsalis pedis pulse.  There is a purulent abscess over the dorsum base right great toenail.  No ascending cellulitis.  After informed consent patient underwent sterile prepping of the right great toe with Betadine.  He underwent a digital block with 10 cc of 1% lidocaine  plain.  After adequate levels anesthesia were obtained the nail was removed without complication.  There was purulent drainage this was decompressed.  There is good bleeding granulation tissue.  A sterile dressing was applied with Xeroform 4 x 4's and Coban patient was placed  in a postoperative shoe.  Most recent hemoglobin A1c 8.3.  Imaging: No results found. No images are attached to the encounter.  Labs: Lab Results  Component Value Date   HGBA1C 8.3 (H) 05/15/2024   HGBA1C 8.2 (H) 02/08/2024   HGBA1C 6.9 11/17/2023   ESRSEDRATE 4 02/08/2024   REPTSTATUS 04/05/2013 FINAL 04/04/2013   CULT NO GROWTH Performed at [facility] 04/04/2013     Lab Results  Component Value Date   ALBUMIN 4.6 05/15/2024   ALBUMIN 4.8 02/08/2024   ALBUMIN 4.2 02/17/2023    No results found for: MG No results found for: VD25OH  No results found for: PREALBUMIN    Latest Ref Rng & Units 05/15/2024   10:12 AM 02/08/2024    2:52 PM 05/10/2023   11:10 AM  CBC EXTENDED  WBC 4.0 - 10.5 K/uL 6.7  8.5  8.8   RBC 4.22 - 5.81 Mil/uL 4.95  4.87  4.92   Hemoglobin 13.0 - 17.0 g/dL 85.3  85.7  85.1   HCT 39.0 - 52.0 % 44.0  43.1  45.6   Platelets 150.0 - 400.0 K/uL 324.0  283.0  272   NEUT# 1.4 - 7.7 K/uL 4.5  5.2    Lymph# 0.7 - 4.0 K/uL 1.7  2.6       There is no height or weight on file to calculate BMI.  Orders:  No orders of the defined types were placed in this encounter.  No orders of the defined types were placed in this encounter.    Procedures: No procedures performed  Clinical Data: No additional findings.  ROS:  All other systems negative, except as noted in the HPI. Review of Systems  Objective: Vital Signs: There were no vitals taken for this visit.  Specialty Comments:  No specialty comments available.  PMFS History: Patient Active Problem List   Diagnosis Date Noted   Multiple contusions 05/15/2024   TMJ (temporomandibular joint syndrome) 02/08/2024   Hypertension associated with diabetes (HCC) 02/10/2023   Dyslipidemia associated with type 2 diabetes mellitus (HCC) 02/10/2023   Pure hypercholesterolemia 03/07/2020   Uncontrolled type 2 diabetes mellitus with hyperglycemia (HCC) 06/28/2018   Essential hypertension,  benign 06/28/2018   Past Medical History:  Diagnosis Date   Diabetes mellitus type 2 in nonobese (HCC) 03/07/2020   Diabetes mellitus without complication (HCC)    Exertional dyspnea 03/07/2020   Hypertension    Pure hypercholesterolemia 03/07/2020   Tobacco abuse 03/07/2020    Family History  Problem Relation Age of Onset   Diabetes Mother    Diabetes Father    Hypertension Sister    Diabetes Sister    Hypertension Brother    High blood pressure Other     History reviewed. No pertinent surgical history. Social History   Occupational History   Not on file  Tobacco Use   Smoking status: Former    Current packs/day: 0.00    Average packs/day: 0.3 packs/day for 5.0 years (1.3 ttl pk-yrs)    Types: Cigarettes    Start date: 2017    Quit date: 2022    Years since quitting: 3.9   Smokeless tobacco: Never  Vaping Use   Vaping status: Never Used  Substance and Sexual Activity   Alcohol use: Yes    Comment: occasionally   Drug use: No   Sexual activity: Yes    Birth control/protection: None

## 2024-07-03 ENCOUNTER — Encounter: Payer: Self-pay | Admitting: Orthopedic Surgery

## 2024-07-03 ENCOUNTER — Ambulatory Visit: Payer: Self-pay | Admitting: Orthopedic Surgery

## 2024-07-03 DIAGNOSIS — L03031 Cellulitis of right toe: Secondary | ICD-10-CM

## 2024-07-03 NOTE — Progress Notes (Signed)
 "  Office Visit Note   Patient: Adam Wiley           Date of Birth: 1976/07/05           MRN: [id number] Visit Date: 07/03/2024              Requested by: Purcell Emil Schanz, MD [address] [place],  [place] [address] PCP: Purcell Emil Schanz, MD  Chief Complaint  Patient presents with   Right Foot - Follow-up    GT infection       HPI: Discussed the use of AI scribe software for clinical note transcription with the patient, who gave verbal consent to proceed.  History of Present Illness Adam Wiley is a 48 year old male status post right great toe nail excision who presents for follow-up of post-excision wound and ongoing toe symptoms.  He continues to experience discomfort localized to the right great toe, primarily described as pruritus rather than pain. He is unable to tolerate regular footwear due to persistent sensitivity and discomfort with pressure over the toe. Showering is tolerated, but direct contact or pressure from shoes remains problematic.  He manages the wound with a Band-Aid and topical antibiotic ointment, previously using thick waterproof dressings. He inquires about appropriate placement of ointment and dressing, and expresses concern regarding persistent dark discoloration at the site, which he associates with prior infection.  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 He expresses concern about wearing steel-toed shoes and prefers a wide shoe for comfort.     Assessment & Plan: Visit Diagnoses:  1. Paronychia of great toe of right foot     Plan: Assessment and Plan Assessment & Plan Post-nail excision wound of toe Recovering with healthy granulation tissue, no infection, persistent pruritus and sensitivity, no drainage, edema, or cellulitis. Discoloration expected. Healing progressing appropriately. Able to perform light duty work, not comfortable in  regular or steel-toed footwear. - Continue daily dressing changes with Band-Aid and small amount of topical antibiotic ointment. - Maintain wound moisture, avoid excessive ointment. - Gradual transition to regular, wide shoe as tolerated. - Provided work note for return to light duty, advance activities as tolerated.      Follow-Up Instructions: Return if symptoms worsen or fail to improve.   Ortho Exam  Patient is alert, oriented, no adenopathy, well-dressed, normal affect, normal respiratory effort. Physical Exam EXTREMITIES: Dorsalis pedis pulse strong. Nail excision site with flat, healthy granulation tissue. No cellulitis, odor, or drainage at nail excision site. No swelling or signs of infection in toe.      Imaging: No results found. No images are attached to the encounter.  Labs: Lab Results  Component Value Date   HGBA1C 8.3 (H) 05/15/2024   HGBA1C 8.2 (H) 02/08/2024   HGBA1C 6.9 11/17/2023   ESRSEDRATE 4 02/08/2024   REPTSTATUS 04/05/2013 FINAL 04/04/2013   CULT NO GROWTH Performed at [facility] 04/04/2013     Lab Results  Component Value Date   ALBUMIN 4.6 05/15/2024   ALBUMIN 4.8 02/08/2024   ALBUMIN 4.2 02/17/2023    No results found for: MG No results found for: VD25OH  No results found for: PREALBUMIN    Latest Ref Rng & Units 05/15/2024   10:12 AM 02/08/2024    2:52 PM 05/10/2023   11:10 AM  CBC EXTENDED  WBC 4.0 - 10.5 K/uL 6.7  8.5  8.8  RBC 4.22 - 5.81 Mil/uL 4.95  4.87  4.92   Hemoglobin 13.0 - 17.0 g/dL 85.3  85.7  85.1   HCT 39.0 - 52.0 % 44.0  43.1  45.6   Platelets 150.0 - 400.0 K/uL 324.0  283.0  272   NEUT# 1.4 - 7.7 K/uL 4.5  5.2    Lymph# 0.7 - 4.0 K/uL 1.7  2.6       There is no height or weight on file to calculate BMI.  Orders:  No orders of the defined types were placed in this encounter.  No orders of the defined types were placed in this encounter.    Procedures: No procedures  performed  Clinical Data: No additional findings.  ROS:  All other systems negative, except as noted in the HPI. Review of Systems  Objective: Vital Signs: There were no vitals taken for this visit.  Specialty Comments:  No specialty comments available.  PMFS History: Patient Active Problem List   Diagnosis Date Noted   Multiple contusions 05/15/2024   TMJ (temporomandibular joint syndrome) 02/08/2024   Hypertension associated with diabetes (HCC) 02/10/2023   Dyslipidemia associated with type 2 diabetes mellitus (HCC) 02/10/2023   Pure hypercholesterolemia 03/07/2020   Uncontrolled type 2 diabetes mellitus with hyperglycemia (HCC) 06/28/2018   Essential hypertension, benign 06/28/2018   Past Medical History:  Diagnosis Date   Diabetes mellitus type 2 in nonobese (HCC) 03/07/2020   Diabetes mellitus without complication (HCC)    Exertional dyspnea 03/07/2020   Hypertension    Pure hypercholesterolemia 03/07/2020   Tobacco abuse 03/07/2020    Family History  Problem Relation Age of Onset   Diabetes Mother    Diabetes Father    Hypertension Sister    Diabetes Sister    Hypertension Brother    High blood pressure Other     History reviewed. No pertinent surgical history. Social History   Occupational History   Not on file  Tobacco Use   Smoking status: Former    Current packs/day: 0.00    Average packs/day: 0.3 packs/day for 5.0 years (1.3 ttl pk-yrs)    Types: Cigarettes    Start date: 2017    Quit date: 2022    Years since quitting: 3.9   Smokeless tobacco: Never  Vaping Use   Vaping status: Never Used  Substance and Sexual Activity   Alcohol use: Yes    Comment: occasionally   Drug use: No   Sexual activity: Yes    Birth control/protection: None         "

## 2024-07-04 ENCOUNTER — Ambulatory Visit: Payer: Self-pay | Admitting: Emergency Medicine

## 2024-07-05 ENCOUNTER — Other Ambulatory Visit (HOSPITAL_COMMUNITY): Payer: Self-pay

## 2024-07-05 ENCOUNTER — Telehealth: Payer: Self-pay | Admitting: Family Medicine

## 2024-07-05 DIAGNOSIS — J019 Acute sinusitis, unspecified: Secondary | ICD-10-CM

## 2024-07-05 MED ORDER — FLUTICASONE PROPIONATE 50 MCG/ACT NA SUSP
2.0000 | Freq: Every day | NASAL | 0 refills | Status: AC
  Filled 2024-07-05: qty 16, 30d supply, fill #0

## 2024-07-05 MED ORDER — BENZONATATE 100 MG PO CAPS
100.0000 mg | ORAL_CAPSULE | Freq: Three times a day (TID) | ORAL | 0 refills | Status: AC | PRN
  Filled 2024-07-05: qty 21, 7d supply, fill #0

## 2024-07-05 MED ORDER — PREDNISONE 10 MG (21) PO TBPK
ORAL_TABLET | ORAL | 0 refills | Status: AC
  Filled 2024-07-05: qty 21, 6d supply, fill #0

## 2024-07-05 MED ORDER — AMOXICILLIN-POT CLAVULANATE 875-125 MG PO TABS
1.0000 | ORAL_TABLET | Freq: Two times a day (BID) | ORAL | 0 refills | Status: AC
  Filled 2024-07-05: qty 14, 7d supply, fill #0

## 2024-07-05 NOTE — Progress Notes (Signed)
 " Virtual Visit Consent   Adam Wiley, you are scheduled for a virtual visit with a [facility] provider today. Just as with appointments in the office, your consent must be obtained to participate. Your consent will be active for this visit and any virtual visit you may have with one of our providers in the next 365 days. If you have a MyChart account, a copy of this consent can be sent to you electronically.  As this is a virtual visit, video technology does not allow for your provider to perform a traditional examination. This may limit your provider's ability to fully assess your condition. If your provider identifies any concerns that need to be evaluated in person or the need to arrange testing (such as labs, EKG, etc.), we will make arrangements to do so. Although advances in technology are sophisticated, we cannot ensure that it will always work on either your end or our end. If the connection with a video visit is poor, the visit may have to be switched to a telephone visit. With either a video or telephone visit, we are not always able to ensure that we have a secure connection.  By engaging in this virtual visit, you consent to the provision of healthcare and authorize for your insurance to be billed (if applicable) for the services provided during this visit. Depending on your insurance coverage, you may receive a charge related to this service.  I need to obtain your verbal consent now. Are you willing to proceed with your visit today? Adam Wiley has provided verbal consent on 07/05/2024 for a virtual visit (video or telephone). Roosvelt Mater, [place]  Date: 07/05/2024 9:15 AM   Virtual Visit via Video Note   IRoosvelt Mater, connected with  [name]  ([id number], 1975/09/10) on 07/05/2024 at  9:00 AM EST by a video-enabled telemedicine application and verified that I am speaking with the correct person using two identifiers.  Location: Patient: Virtual Visit Location Patient:  Home Provider: Virtual Visit Location Provider: Home Office   I discussed the limitations of evaluation and management by telemedicine and the availability of in person appointments. The patient expressed understanding and agreed to proceed.    History of Present Illness: Adam Wiley is a 48 y.o. who identifies as a male who was assigned male at birth, and is being seen today for c/o he thinks he has a sinus infection.  Pt c/o pressure behind eyes, head pressure, coughing and blowing green/clear mucus.  Pt states symptoms started two days ago.  Pt states using flonase , Allegra  and vitamin C.  Pt states no one around him has similar symptoms.  Pt states he gets these every other year.  Pt states he is feeling draining.  Pt states the mucus is worsening.   Video connection was lost when less than 50% of the duration of the visit was complete, at which time the remainder of the visit was completed via audio only.   HPI: HPI  Problems:  Patient Active Problem List   Diagnosis Date Noted   Multiple contusions 05/15/2024   TMJ (temporomandibular joint syndrome) 02/08/2024   Hypertension associated with diabetes (HCC) 02/10/2023   Dyslipidemia associated with type 2 diabetes mellitus (HCC) 02/10/2023   Pure hypercholesterolemia 03/07/2020   Uncontrolled type 2 diabetes mellitus with hyperglycemia (HCC) 06/28/2018   Essential hypertension, benign 06/28/2018    Allergies: Allergies[1] Medications: Current Medications[2]  Observations/Objective: Patient is well-developed, well-nourished in no acute distress.  Resting comfortably  at home.  Head is normocephalic, atraumatic.  No labored breathing.  Speech is clear and coherent with logical content.  Patient is alert and oriented at baseline.    Assessment and Plan: 1. Acute non-recurrent sinusitis, unspecified location (Primary) - predniSONE  (STERAPRED UNI-PAK 21 TAB) 10 MG (21) TBPK tablet; Take following package directions.  Dispense:  21 tablet; Refill: 0 - benzonatate  (TESSALON ) 100 MG capsule; Take 1 capsule (100 mg total) by mouth 3 (three) times daily as needed for up to 7 days.  Dispense: 21 capsule; Refill: 0 - amoxicillin -clavulanate (AUGMENTIN ) 875-125 MG tablet; Take 1 tablet by mouth 2 (two) times daily for 7 days.  Dispense: 14 tablet; Refill: 0 - fluticasone  (FLONASE ) 50 MCG/ACT nasal spray; Place 2 sprays into both nostrils daily.  Dispense: 16 g; Refill: 0  -Pt to follow up in person with urgent care or PCP if symptoms persist or worsen  Follow Up Instructions: I discussed the assessment and treatment plan with the patient. The patient was provided an opportunity to ask questions and all were answered. The patient agreed with the plan and demonstrated an understanding of the instructions.  A copy of instructions were sent to the patient via MyChart unless otherwise noted below.     The patient was advised to call back or seek an in-person evaluation if the symptoms worsen or if the condition fails to improve as anticipated.    Roosvelt Mater, PA-C    [1] No Known Allergies [2]  Current Outpatient Medications:    amoxicillin -clavulanate (AUGMENTIN ) 875-125 MG tablet, Take 1 tablet by mouth 2 (two) times daily for 7 days., Disp: 14 tablet, Rfl: 0   benzonatate  (TESSALON ) 100 MG capsule, Take 1 capsule (100 mg total) by mouth 3 (three) times daily as needed for up to 7 days., Disp: 21 capsule, Rfl: 0   cetirizine  (ZYRTEC  ALLERGY) 10 MG tablet, Take 1 tablet (10 mg total) by mouth daily., Disp: 30 tablet, Rfl: 2   clotrimazole -betamethasone  (LOTRISONE ) cream, Apply 1 Application topically daily. (Patient not taking: Reported on 06/22/2024), Disp: 30 g, Rfl: 0   Empagliflozin -metFORMIN  HCl ER (SYNJARDY  XR) 12.11-998 MG TB24, Take 1 tablet by mouth daily., Disp: , Rfl:    esomeprazole  (NEXIUM ) 40 MG capsule, TAKE 1 CAPSULE BY MOUTH DAILY (Patient not taking: Reported on 06/22/2024), Disp: 90 capsule, Rfl: 1    fluticasone  (FLONASE ) 50 MCG/ACT nasal spray, Place 2 sprays into both nostrils daily., Disp: 16 g, Rfl: 0   lisinopril -hydrochlorothiazide  (ZESTORETIC ) 20-12.5 MG tablet, Take 1 tablet by mouth daily., Disp: 90 tablet, Rfl: 3   Multiple Vitamins-Minerals (CENTRUM PO), Take by mouth., Disp: , Rfl:    predniSONE  (STERAPRED UNI-PAK 21 TAB) 10 MG (21) TBPK tablet, Take following package directions., Disp: 21 tablet, Rfl: 0   rosuvastatin  (CRESTOR ) 10 MG tablet, Take 1 tablet (10 mg total) by mouth daily., Disp: , Rfl:   "

## 2024-07-05 NOTE — Patient Instructions (Signed)
 " Zachary JAYSON Donalds, thank you for joining Roosvelt Mater, PA-C for today's virtual visit.  While this provider is not your primary care provider (PCP), if your PCP is located in our provider database this encounter information will be shared with them immediately following your visit.   A [facility] MyChart account gives you access to today's visit and all your visits, tests, and labs performed at [facility]  click here if you don't have a [facility] MyChart account or go to [email or web address]  Consent: (Patient) Tj C Thoen provided verbal consent for this virtual visit at the beginning of the encounter.  Current Medications:  Current Outpatient Medications:    amoxicillin -clavulanate (AUGMENTIN ) 875-125 MG tablet, Take 1 tablet by mouth 2 (two) times daily for 7 days., Disp: 14 tablet, Rfl: 0   benzonatate  (TESSALON ) 100 MG capsule, Take 1 capsule (100 mg total) by mouth 3 (three) times daily as needed for up to 7 days., Disp: 21 capsule, Rfl: 0   predniSONE  (STERAPRED UNI-PAK 21 TAB) 10 MG (21) TBPK tablet, Take following package directions., Disp: 21 tablet, Rfl: 0   cetirizine  (ZYRTEC  ALLERGY) 10 MG tablet, Take 1 tablet (10 mg total) by mouth daily., Disp: 30 tablet, Rfl: 2   clotrimazole -betamethasone  (LOTRISONE ) cream, Apply 1 Application topically daily. (Patient not taking: Reported on 06/22/2024), Disp: 30 g, Rfl: 0   Empagliflozin -metFORMIN  HCl ER (SYNJARDY  XR) 12.11-998 MG TB24, Take 1 tablet by mouth daily., Disp: , Rfl:    esomeprazole  (NEXIUM ) 40 MG capsule, TAKE 1 CAPSULE BY MOUTH DAILY (Patient not taking: Reported on 06/22/2024), Disp: 90 capsule, Rfl: 1   fluticasone  (FLONASE ) 50 MCG/ACT nasal spray, Place 2 sprays into both nostrils daily., Disp: 16 g, Rfl: 0   lisinopril -hydrochlorothiazide  (ZESTORETIC ) 20-12.5 MG tablet, Take 1 tablet by mouth daily., Disp: 90 tablet, Rfl: 3   Multiple Vitamins-Minerals (CENTRUM PO), Take by mouth., Disp: , Rfl:     rosuvastatin  (CRESTOR ) 10 MG tablet, Take 1 tablet (10 mg total) by mouth daily., Disp: , Rfl:    Medications ordered in this encounter:  Meds ordered this encounter  Medications   predniSONE  (STERAPRED UNI-PAK 21 TAB) 10 MG (21) TBPK tablet    Sig: Take following package directions.    Dispense:  21 tablet    Refill:  0    Please dispense one standard blister pack taper.   benzonatate  (TESSALON ) 100 MG capsule    Sig: Take 1 capsule (100 mg total) by mouth 3 (three) times daily as needed for up to 7 days.    Dispense:  21 capsule    Refill:  0   amoxicillin -clavulanate (AUGMENTIN ) 875-125 MG tablet    Sig: Take 1 tablet by mouth 2 (two) times daily for 7 days.    Dispense:  14 tablet    Refill:  0   fluticasone  (FLONASE ) 50 MCG/ACT nasal spray    Sig: Place 2 sprays into both nostrils daily.    Dispense:  16 g    Refill:  0     *If you need refills on other medications prior to your next appointment, please contact your pharmacy*  Follow-Up: Call back or seek an in-person evaluation if the symptoms worsen or if the condition fails to improve as anticipated.  [facility] [phone]  Other Instructions Sinus Infection, Adult A sinus infection, also called sinusitis, is inflammation of your sinuses. Sinuses are hollow spaces in the bones around your face. Your sinuses are located: Around your eyes.  In the middle of your forehead. Behind your nose. In your cheekbones. Mucus normally drains out of your sinuses. When your nasal tissues become inflamed or swollen, mucus can become trapped or blocked. This allows bacteria, viruses, and fungi to grow, which leads to infection. Most infections of the sinuses are caused by a virus. A sinus infection can develop quickly. It can last for up to 4 weeks (acute) or for more than 12 weeks (chronic). A sinus infection often develops after a cold. What are the causes? This condition is caused by anything that creates swelling  in the sinuses or stops mucus from draining. This includes: Allergies. Asthma. Infection from bacteria or viruses. Deformities or blockages in your nose or sinuses. Abnormal growths in the nose (nasal polyps). Pollutants, such as chemicals or irritants in the air. Infection from fungi. This is rare. What increases the risk? You are more likely to develop this condition if you: Have a weak body defense system (immune system). Do a lot of swimming or diving. Overuse nasal sprays. Smoke. What are the signs or symptoms? The main symptoms of this condition are pain and a feeling of pressure around the affected sinuses. Other symptoms include: Stuffy nose or congestion that makes it difficult to breathe through your nose. Thick yellow or greenish drainage from your nose. Tenderness, swelling, and warmth over the affected sinuses. A cough that may get worse at night. Decreased sense of smell and taste. Extra mucus that collects in the throat or the back of the nose (postnasal drip) causing a sore throat or bad breath. Tiredness (fatigue). Fever. How is this diagnosed? This condition is diagnosed based on: Your symptoms. Your medical history. A physical exam. Tests to find out if your condition is acute or chronic. This may include: Checking your nose for nasal polyps. Viewing your sinuses using a device that has a light (endoscope). Testing for allergies or bacteria. Imaging tests, such as an MRI or CT scan. In rare cases, a bone biopsy may be done to rule out more serious types of fungal sinus disease. How is this treated? Treatment for a sinus infection depends on the cause and whether your condition is chronic or acute. If caused by a virus, your symptoms should go away on their own within 10 days. You may be given medicines to relieve symptoms. They include: Medicines that shrink swollen nasal passages (decongestants). A spray that eases inflammation of the nostrils (topical  intranasal corticosteroids). Rinses that help get rid of thick mucus in your nose (nasal saline washes). Medicines that treat allergies (antihistamines). Over-the-counter pain relievers. If caused by bacteria, your health care provider may recommend waiting to see if your symptoms improve. Most bacterial infections will get better without antibiotic medicine. You may be given antibiotics if you have: A severe infection. A weak immune system. If caused by narrow nasal passages or nasal polyps, surgery may be needed. Follow these instructions at home: Medicines Take, use, or apply over-the-counter and prescription medicines only as told by your health care provider. These may include nasal sprays. If you were prescribed an antibiotic medicine, take it as told by your health care provider. Do not stop taking the antibiotic even if you start to feel better. Hydrate and humidify  Drink enough fluid to keep your urine pale yellow. Staying hydrated will help to thin your mucus. Use a cool mist humidifier to keep the humidity level in your home above 50%. Inhale steam for 10-15 minutes, 3-4 times a day, or as  told by your health care provider. You can do this in the bathroom while a hot shower is running. Limit your exposure to cool or dry air. Rest Rest as much as possible. Sleep with your head raised (elevated). Make sure you get enough sleep each night. General instructions  Apply a warm, moist washcloth to your face 3-4 times a day or as told by your health care provider. This will help with discomfort. Use nasal saline washes as often as told by your health care provider. Wash your hands often with soap and water to reduce your exposure to germs. If soap and water are not available, use hand sanitizer. Do not smoke. Avoid being around people who are smoking (secondhand smoke). Keep all follow-up visits. This is important. Contact a health care provider if: You have a fever. Your symptoms  get worse. Your symptoms do not improve within 10 days. Get help right away if: You have a severe headache. You have persistent vomiting. You have severe pain or swelling around your face or eyes. You have vision problems. You develop confusion. Your neck is stiff. You have trouble breathing. These symptoms may be an emergency. Get help right away. Call 911. Do not wait to see if the symptoms will go away. Do not drive yourself to the hospital. Summary A sinus infection is soreness and inflammation of your sinuses. Sinuses are hollow spaces in the bones around your face. This condition is caused by nasal tissues that become inflamed or swollen. The swelling traps or blocks the flow of mucus. This allows bacteria, viruses, and fungi to grow, which leads to infection. If you were prescribed an antibiotic medicine, take it as told by your health care provider. Do not stop taking the antibiotic even if you start to feel better. Keep all follow-up visits. This is important. This information is not intended to replace advice given to you by your health care provider. Make sure you discuss any questions you have with your health care provider. Document Revised: 06/03/2021 Document Reviewed: 06/03/2021 Elsevier Patient Education  2024 Elsevier Inc.   If you have been instructed to have an in-person evaluation today at a local Urgent Care facility, please use the link below. It will take you to a list of all of our available [facility], including address, phone number and hours of operation. Please do not delay care.  [facility]  If you or a family member do not have a primary care provider, use the link below to schedule a visit and establish care. When you choose a [facility] primary care physician or advanced practice provider, you gain a long-term partner in health. Find a Primary Care Provider  Learn more about [facility]'s in-office and virtual care  options: [facility] - Get Care Now  "

## 2024-07-10 ENCOUNTER — Ambulatory Visit: Payer: Self-pay | Admitting: Emergency Medicine

## 2024-07-10 ENCOUNTER — Encounter: Payer: Self-pay | Admitting: Emergency Medicine

## 2024-07-10 VITALS — Ht 72.0 in

## 2024-07-14 ENCOUNTER — Other Ambulatory Visit: Payer: Self-pay | Admitting: Pharmacist

## 2024-07-14 ENCOUNTER — Other Ambulatory Visit (HOSPITAL_COMMUNITY): Payer: Self-pay

## 2024-07-14 DIAGNOSIS — E1165 Type 2 diabetes mellitus with hyperglycemia: Secondary | ICD-10-CM

## 2024-07-14 DIAGNOSIS — E1169 Type 2 diabetes mellitus with other specified complication: Secondary | ICD-10-CM

## 2024-07-14 MED ORDER — SYNJARDY XR 12.5-1000 MG PO TB24
1.0000 | ORAL_TABLET | Freq: Every day | ORAL | 5 refills | Status: DC
  Filled 2024-07-14: qty 30, 30d supply, fill #0

## 2024-07-14 MED ORDER — ROSUVASTATIN CALCIUM 10 MG PO TABS
10.0000 mg | ORAL_TABLET | Freq: Every day | ORAL | 5 refills | Status: AC
  Filled 2024-07-14: qty 30, 30d supply, fill #0

## 2024-07-14 NOTE — Progress Notes (Signed)
 Sent refills of Synjardy  and rosuvastatin  to [facility] - pt was waiting until the New Year to get Rxs filled with new insurance. Upcoming follow up with me on 1/6 to see how cost of meds are w/ new insurance.  Darrelyn Drum, PharmD, BCPS, CPP Clinical Pharmacist Practitioner [facility] at [facility] [phone]

## 2024-07-18 ENCOUNTER — Other Ambulatory Visit: Payer: Self-pay

## 2024-07-18 ENCOUNTER — Other Ambulatory Visit (HOSPITAL_COMMUNITY): Payer: Self-pay

## 2024-07-18 NOTE — Progress Notes (Incomplete)
 "  07/18/2024 Name: Adam Wiley MRN: [id number] DOB: Nov 13, 1975  No chief complaint on file.   Adam Wiley is a 49 y.o. year old male who presented for a telephone visit.   They were referred to the pharmacist by their PCP for assistance in managing diabetes and hyperlipidemia/cardiovascular risk reduction.   Subjective:  Care Team: Primary Care Provider: Purcell Emil Schanz, MD ; Next Scheduled Visit: 07/10/24  Medication Access/Adherence  Current Pharmacy:  [facility] - [facility] [address] [place] [address] Phone: [phone] Fax: [phone]  [facility] - [facility] [address] [place] [address] Phone: [phone] Fax: [phone]  [id number] - [place], [place] - [address] [place] [address] Phone: [phone] Fax: [phone]   Patient reports affordability concerns with their medications: Yes  Patient reports access/transportation concerns to their pharmacy: No  Patient reports adherence concerns with their medications:  No    Should be getting insurance starting 07/13/24  Diabetes:  Current medications: metformin  500 mg BID Medications tried in the past: glipizide  (ineffective), Synjardy  (cost), Kombiglyze   *BG were well controlled on Synjardy  12.11/998 mg daily but pt had to stop due to losing insurance coverage and cost being too high  Current meal patterns:  Breakfast: oatmeal with walnuts, drizzle honey, maybe banana Lunch: salad with chicken  Dinner: grilled chicken with asparagus, sometimes wheat bread 64 oz water  Current physical activity: regular exercise  Macrovascular and Microvascular Risk Reduction:  Statin? no; patient previously intolerant to statin therapy; ACEi/ARB? yes (lisinopril /HCTZ) Last urinary albumin/creatinine ratio:  Lab Results  Component Value Date   MICRALBCREAT 34.6 (H) 05/15/2024    MICRALBCREAT 5 03/20/2021   MICRALBCREAT <30 12/19/2019   MICRALBCREAT 30 06/28/2018   Last eye exam:  Lab Results  Component Value Date   HMDIABEYEEXA No Retinopathy 08/12/2021   Last foot exam: No foot exam found Tobacco Use:  Tobacco Use: Medium Risk (07/10/2024)   Patient History    Smoking Tobacco Use: Former    Smokeless Tobacco Use: Never    Passive Exposure: Not on file   Hyperlipidemia/ASCVD Risk Reduction  Current lipid lowering medications: none currently Medications tried in the past: atorvastatin  40 mg (muscle/joint pain)   PREVENT Risk Score:  [email or web address].fr - 10 year risk of CVD: 10.3% - 10 year risk of ASCVD: 6.5% - 10 year risk of HF: 4.6%  The 10-year ASCVD risk score (Arnett DK, et al., 2019) is: 21.2%   Values used to calculate the score:     Age: 32 years     Clinically relevant sex: Male     Is Non-Hispanic African American: Yes     Diabetic: Yes     Tobacco smoker: No     Systolic Blood Pressure: 148 mmHg     Is BP treated: Yes     HDL Cholesterol: 50.8 mg/dL     Total Cholesterol: 225 mg/dL    Objective:  Lab Results  Component Value Date   HGBA1C 8.3 (H) 05/15/2024    Lab Results  Component Value Date   CREATININE 1.11 05/15/2024   BUN 15 05/15/2024   NA 139 05/15/2024   K 4.6 05/15/2024   CL 104 05/15/2024   CO2 30 05/15/2024    Lab Results  Component Value Date   CHOL 225 (H) 05/15/2024   HDL 50.80 05/15/2024   LDLCALC 140 (H) 05/15/2024   LDLDIRECT 146.0 02/17/2023   TRIG  168.0 (H) 05/15/2024   CHOLHDL 4 05/15/2024    Medications Reviewed Today   Medications were not reviewed in this encounter       Assessment/Plan:   Diabetes: - Currently uncontrolled; goal A1c <7%. Cardiorenal risk reduction is opportunities for improvement.. Blood pressure is at goal <130/80. LDL is not at goal.  - Reviewed Wiley term cardiovascular and  renal outcomes of uncontrolled blood sugar. and Reviewed dietary modifications including increased protein/fiber, moderation, balanced meals/snacks. - Recommend to restart Synjardy  XR 12.11/998 mg daily once insurance coverage starts in January since this regimen worked well previously. Will send Rx after 07/13/24 to see if cost is affordable. - Continue metformin  for now with plan to change after 1/1  Hyperlipidemia/ASCVD Risk Reduction: - Currently uncontrolled. LDL goal <70  - Reviewed Wiley term complications of uncontrolled cholesterol - Recommend to start rosuvastatin  10 mg daily (pt prefers to wait to send after 1/1)   Follow Up Plan: 07/19/23  Darrelyn Drum, PharmD, BCPS, CPP Clinical Pharmacist Practitioner [facility] at [facility] [phone]    "

## 2024-07-25 ENCOUNTER — Other Ambulatory Visit (HOSPITAL_COMMUNITY): Payer: Self-pay

## 2024-08-17 ENCOUNTER — Other Ambulatory Visit (HOSPITAL_COMMUNITY): Payer: Self-pay

## 2024-08-17 ENCOUNTER — Encounter: Payer: Self-pay | Admitting: Emergency Medicine

## 2024-08-17 ENCOUNTER — Ambulatory Visit: Payer: Self-pay | Admitting: Emergency Medicine

## 2024-08-17 VITALS — BP 128/86 | HR 75 | Temp 98.0°F | Ht 72.0 in | Wt 178.0 lb

## 2024-08-17 DIAGNOSIS — E1169 Type 2 diabetes mellitus with other specified complication: Secondary | ICD-10-CM

## 2024-08-17 DIAGNOSIS — E1159 Type 2 diabetes mellitus with other circulatory complications: Secondary | ICD-10-CM

## 2024-08-17 DIAGNOSIS — N5201 Erectile dysfunction due to arterial insufficiency: Secondary | ICD-10-CM

## 2024-08-17 DIAGNOSIS — E1165 Type 2 diabetes mellitus with hyperglycemia: Secondary | ICD-10-CM

## 2024-08-17 LAB — POCT GLYCOSYLATED HEMOGLOBIN (HGB A1C): HbA1c POC (<> result, manual entry): 8.6 %

## 2024-08-17 MED ORDER — SILDENAFIL CITRATE 100 MG PO TABS
50.0000 mg | ORAL_TABLET | Freq: Every day | ORAL | 11 refills | Status: AC | PRN
  Filled 2024-08-17: qty 5, 5d supply, fill #0

## 2024-08-17 MED ORDER — SYNJARDY XR 12.5-1000 MG PO TB24
1.0000 | ORAL_TABLET | Freq: Every day | ORAL | 5 refills | Status: AC
  Filled 2024-08-17: qty 30, 30d supply, fill #0

## 2024-08-17 NOTE — Patient Instructions (Signed)

## 2024-08-17 NOTE — Assessment & Plan Note (Signed)
 BP Readings from Last 3 Encounters:  08/17/24 128/86  06/22/24 (!) 148/97  06/12/24 (!) 169/106  Well-controlled hypertension Continue Zestoretic  20-12.5 mg daily Uncontrolled diabetes with hemoglobin A1c higher than before at 8.6 Not taking medications Recommend to get back to Synjardy  12.11-998 mg daily Cardiovascular risks associated with uncontrolled diabetes discussed Diet and nutrition discussed Effects of exercise discussed Recommend follow-up in 6 months

## 2024-08-17 NOTE — Progress Notes (Signed)
 Adam Wiley 49 y.o.   Chief Complaint  Patient presents with   Follow-up    HISTORY OF PRESENT ILLNESS: This is a 49 y.o. male here for follow-up of chronic medical conditions including diabetes and hypertension Not taking diabetes medication Trying to control diabetes with nutrition alone Also has some concerns about sexual health.  HPI   Prior to Admission medications  Medication Sig Start Date End Date Taking? Authorizing Provider  cetirizine  (ZYRTEC  ALLERGY) 10 MG tablet Take 1 tablet (10 mg total) by mouth daily. 06/12/23  Yes Rising, Asberry, PA-C  Empagliflozin -metFORMIN  HCl ER (SYNJARDY  XR) 12.11-998 MG TB24 Take 1 tablet by mouth daily. 07/14/24  Yes Jezebelle Ledwell, Emil Schanz, MD  fluticasone  (FLONASE ) 50 MCG/ACT nasal spray Place 2 sprays into both nostrils daily. 07/05/24  Yes Reyes, Shaylo, PA-C  lisinopril -hydrochlorothiazide  (ZESTORETIC ) 20-12.5 MG tablet Take 1 tablet by mouth daily. 03/27/24  Yes Kameah Rawl, Emil Schanz, MD  Multiple Vitamins-Minerals (CENTRUM PO) Take by mouth.   Yes [provider]  predniSONE  (STERAPRED UNI-PAK 21 TAB) 10 MG (21) TBPK tablet Take following package directions. 07/05/24  Yes Reyes, Roosvelt, PA-C  rosuvastatin  (CRESTOR ) 10 MG tablet Take 1 tablet (10 mg total) by mouth daily. 07/14/24  Yes Jahmiya Guidotti Jose, MD  clotrimazole -betamethasone  (LOTRISONE ) cream Apply 1 Application topically daily. Patient not taking: Reported on 08/17/2024 08/09/23   Purcell Emil Schanz, MD  esomeprazole  (NEXIUM ) 40 MG capsule TAKE 1 CAPSULE BY MOUTH DAILY Patient not taking: Reported on 08/17/2024 12/14/22 05/15/24  Jarold Medici, MD    Allergies[1]  Patient Active Problem List   Diagnosis Date Noted   TMJ (temporomandibular joint syndrome) 02/08/2024   Hypertension associated with diabetes (HCC) 02/10/2023   Dyslipidemia associated with type 2 diabetes mellitus (HCC) 02/10/2023   Uncontrolled type 2 diabetes mellitus with hyperglycemia (HCC)  06/28/2018   Essential hypertension, benign 06/28/2018    Past Medical History:  Diagnosis Date   Diabetes mellitus type 2 in nonobese (HCC) 03/07/2020   Diabetes mellitus without complication (HCC)    Exertional dyspnea 03/07/2020   Hypertension    Pure hypercholesterolemia 03/07/2020   Tobacco abuse 03/07/2020    No past surgical history on file.  Social History   Socioeconomic History   Marital status: Married    Spouse name: Not on file   Number of children: Not on file   Years of education: Not on file   Highest education level: 12th grade  Occupational History   Not on file  Tobacco Use   Smoking status: Former    Current packs/day: 0.00    Average packs/day: 0.3 packs/day for 5.0 years (1.3 ttl pk-yrs)    Types: Cigarettes    Start date: 2017    Quit date: 2022    Years since quitting: 4.0   Smokeless tobacco: Never  Vaping Use   Vaping status: Never Used  Substance and Sexual Activity   Alcohol use: Yes    Comment: occasionally   Drug use: No   Sexual activity: Yes    Birth control/protection: None  Other Topics Concern   Not on file  Social History Narrative   Not on file   Social Drivers of Health   Tobacco Use: Medium Risk (08/17/2024)   Patient History    Smoking Tobacco Use: Former    Smokeless Tobacco Use: Never    Passive Exposure: Not on file  Financial Resource Strain: Low Risk (05/15/2024)   Overall Financial Resource Strain (CARDIA)    Difficulty of Paying Living  Expenses: Not hard at all  Food Insecurity: No Food Insecurity (05/15/2024)   Epic    Worried About Programme Researcher, Broadcasting/film/video in the Last Year: Never true    Ran Out of Food in the Last Year: Never true  Recent Concern: Food Insecurity - Food Insecurity Present (03/31/2024)   Epic    Worried About Programme Researcher, Broadcasting/film/video in the Last Year: Never true    [employer] of Food in the Last Year: Often true  Transportation Needs: No Transportation Needs (05/15/2024)   Epic    Lack of Transportation  (Medical): No    Lack of Transportation (Non-Medical): No  Physical Activity: Sufficiently Active (05/15/2024)   Exercise Vital Sign    Days of Exercise per Week: 3 days    Minutes of Exercise per Session: 50 min  Recent Concern: Physical Activity - Insufficiently Active (03/31/2024)   Exercise Vital Sign    Days of Exercise per Week: 3 days    Minutes of Exercise per Session: 30 min  Stress: No Stress Concern Present (05/15/2024)   [employer] of Occupational Health - Occupational Stress Questionnaire    Feeling of Stress: Not at all  Social Connections: Unknown (05/15/2024)   Social Connection and Isolation Panel    Frequency of Communication with Friends and Family: Twice a week    Frequency of Social Gatherings with Friends and Family: Once a week    Attends Religious Services: More than 4 times per year    Active Member of [employer] or Organizations: Patient declined    Attends Banker Meetings: Not on file    Marital Status: Patient declined  Intimate Partner Violence: Unknown (10/13/2021)   Received from [facility]   HITS    Physically Hurt: Not on file    Insult or Talk Down To: Not on file    Threaten Physical Harm: Not on file    Scream or Curse: Not on file  Depression (PHQ2-9): Low Risk (08/17/2024)   Depression (PHQ2-9)    PHQ-2 Score: 0  Alcohol Screen: Low Risk (05/15/2024)   Alcohol Screen    Last Alcohol Screening Score (AUDIT): 2  Housing: Unknown (05/15/2024)   Epic    Unable to Pay for Housing in the Last Year: No    Number of Times Moved in the Last Year: Not on file    Homeless in the Last Year: No  Utilities: Not on file  Health Literacy: Not on file    Family History  Problem Relation Age of Onset   Diabetes Mother    Diabetes Father    Hypertension Sister    Diabetes Sister    Hypertension Brother    High blood pressure Other      Review of Systems  Constitutional: Negative.  Negative for chills and fever.  HENT: Negative.   Negative for congestion and sore throat.   Respiratory: Negative.  Negative for cough and shortness of breath.   Cardiovascular: Negative.  Negative for chest pain and palpitations.  Gastrointestinal:  Negative for abdominal pain, diarrhea, nausea and vomiting.  Genitourinary: Negative.  Negative for dysuria and hematuria.  Skin: Negative.  Negative for rash.  Neurological: Negative.  Negative for dizziness and headaches.  All other systems reviewed and are negative.   Vitals:   08/17/24 0857  BP: 128/86  Pulse: 75  Temp: 98 F (36.7 C)  SpO2: 98%    Physical Exam Vitals reviewed.  Constitutional:      Appearance: Normal appearance.  HENT:     Head: Normocephalic.  Eyes:     Extraocular Movements: Extraocular movements intact.  Cardiovascular:     Rate and Rhythm: Normal rate.  Pulmonary:     Effort: Pulmonary effort is normal.  Skin:    General: Skin is warm and dry.  Neurological:     Mental Status: He is alert and oriented to person, place, and time.  Psychiatric:        Mood and Affect: Mood normal.        Behavior: Behavior normal.    Results for orders placed or performed in visit on 08/17/24 (from the past 24 hours)  POCT HgB A1C     Status: Abnormal   Collection Time: 08/17/24  9:10 AM  Result Value Ref Range   Hemoglobin A1C     HbA1c POC (<> result, manual entry) 8.6 4.0 - 5.6 %   HbA1c, POC (prediabetic range)     HbA1c, POC (controlled diabetic range)       ASSESSMENT & PLAN: A total of 42 minutes was spent with the patient and counseling/coordination of care regarding preparing for this visit, review of most recent office visit notes, review of multiple chronic medical conditions and their management, cardiovascular risks associated with uncontrolled diabetes, review of all medications and changes made, review of most recent bloodwork results including interpretation of today's hemoglobin A1c, review of health maintenance items, education on nutrition,  prognosis, documentation, and need for follow up.   Problem List Items Addressed This Visit       Cardiovascular and Mediastinum   Hypertension associated with diabetes (HCC) - Primary   BP Readings from Last 3 Encounters:  08/17/24 128/86  06/22/24 (!) 148/97  06/12/24 (!) 169/106  Well-controlled hypertension Continue Zestoretic  20-12.5 mg daily Uncontrolled diabetes with hemoglobin A1c higher than before at 8.6 Not taking medications Recommend to get back to Synjardy  12.11-998 mg daily Cardiovascular risks associated with uncontrolled diabetes discussed Diet and nutrition discussed Effects of exercise discussed Recommend follow-up in 6 months       Relevant Medications   Empagliflozin -metFORMIN  HCl ER (SYNJARDY  XR) 12.11-998 MG TB24   sildenafil  (VIAGRA ) 100 MG tablet   Other Relevant Orders   POCT HgB A1C (Completed)     Endocrine   Uncontrolled type 2 diabetes mellitus with hyperglycemia (HCC)   Relevant Medications   Empagliflozin -metFORMIN  HCl ER (SYNJARDY  XR) 12.11-998 MG TB24   Dyslipidemia associated with type 2 diabetes mellitus (HCC)   Uncontrolled diabetes with hemoglobin A1c at 8.6 Recommend to restart Synjardy  Continue rosuvastatin  10 mg daily Diet and nutrition discussed Vascular risk associated with uncontrolled diabetes discussed      Relevant Medications   Empagliflozin -metFORMIN  HCl ER (SYNJARDY  XR) 12.11-998 MG TB24   Other Visit Diagnoses       Erectile dysfunction due to arterial insufficiency       Relevant Medications   sildenafil  (VIAGRA ) 100 MG tablet      Patient Instructions  Diabetes: Carbohydrate Counting for Adults Carbohydrate counting is a method of keeping track of how many carbohydrates you eat. Eating carbohydrates increases the amount of sugar, also called glucose, in your blood. By counting how many carbohydrates you eat, you can improve how well you manage your blood sugar. This, in turn, helps you manage your  diabetes. Carbohydrates are measured in grams (g) per serving. It's important to know how many carbohydrates (in grams or by serving size) you can have in each meal. This is different for every person.  A dietitian can help you make a meal plan and calculate how many carbohydrates you should have at each meal and snack. What foods contain carbohydrates? Carbohydrates are found in these foods: Grains, such as breads and cereals. Dried beans and soy products. Starchy vegetables, such as potatoes, peas, and corn. Fruit and fruit juices. Milk and yogurt. Sweets and snack foods like cake, cookies, candy, chips, and soft drinks. How do I count carbohydrates in foods? There are two ways to count carbohydrates in food. You can read food labels or learn standard serving sizes of foods. You can use either of these methods or a combination of both. Using the Nutrition Facts label The Nutrition Facts list is included on the labels of almost all packaged foods and drinks in the [place] It includes: The serving size. Information about nutrients in each serving. This includes the grams of carbohydrate per serving. To use the Nutrition Facts, decide how many servings you will have. Then, multiply the number of servings by the number of carbohydrates per serving. The resulting number is the total grams of carbohydrates that you'll be having. Learning the standard serving sizes of foods When you eat carbohydrate foods that aren't packaged or don't include Nutrition Facts on the label, you need to measure the servings in order to count the grams of carbohydrates. Measure the foods that you'll eat with a food scale or measuring cup, if needed. Decide how many standard-size servings you'll eat. Multiply the number of servings by 15. For foods that contain carbohydrates, one serving equals 15 g of carbohydrates. For example, if you eat 2 cups or 10 oz (300 g) of strawberries, you'll have eaten 2 servings and 30 g of  carbohydrates (2 servings x 15 g = 30 g). For foods that have more than one food mixed, such as soups and casseroles, you must count the carbohydrates in each food that's included. Here's a list of standard serving sizes for common carbohydrate-rich foods. Each of these servings has about 15 g of carbohydrates: 1 slice of bread. 1 six-inch (15 cm) tortilla. ? cup or 2 oz (53 g) of cooked rice or pasta.  cup or 3 oz (85 g) of cooked or canned, drained, and rinsed beans or lentils.  cup or 3 oz (85 g) of a starchy vegetable, such as peas, corn, or squash.  cup or 4 oz (120 g) of hot cereal.  cup or 3 oz (85 g) of boiled or mashed potatoes, or  or 3 oz (85 g) of a large baked potato.  cup or 4 fl oz (118 mL) of fruit juice. 1 cup or 8 fl oz (237 mL) of milk. 1 small or 4 oz (106 g) apple.  or 2 oz (63 g) of a medium banana. 1 cup or 5 oz (150 g) of strawberries. 3 cups or 1 oz (28.3 g) of popped popcorn. What is an example of carbohydrate counting? To calculate the grams of carbohydrates in this sample meal, follow the steps below. Sample meal 3 oz (85 g) chicken breast. ? cup or 4 oz (106 g) of brown rice.  cup or 3 oz (85 g) of corn. 1 cup or 8 fl oz (237 mL) of milk. 1 cup or 5 oz (150 g) of strawberries with sugar-free whipped topping. Carbohydrate calculation Identify the foods that have carbohydrates: Rice. Corn. Milk. Strawberries. Calculate how many servings you have of each food: 2 servings of rice. 1 serving of corn. 1 serving of milk. 1 serving  of strawberries. Multiply each number of servings by 15 g: 2 servings of rice x 15 g = 30 g. 1 serving of corn x 15 g = 15 g. 1 serving of milk x 15 g = 15 g. 1 serving of strawberries x 15 g = 15 g. Add together all of the amounts to find the total grams of carbohydrates eaten: 30 g + 15 g + 15 g + 15 g = 75 g of carbohydrates total. Where to find more information To learn more, go to: American Diabetes Association  at diabetes.org. Click Search and type carb counting. Find the link you need. Centers for Disease Control and Prevention at tonerpromos.no. Click Search and type diabetes. Find the link you need. Academy of Nutrition and Dietetics: eatright.org This information is not intended to replace advice given to you by your health care provider. Make sure you discuss any questions you have with your health care provider. Document Revised: 06/16/2023 Document Reviewed: 06/16/2023 Elsevier Patient Education  2025 Elsevier Inc.     Emil Schaumann, MD [facility]    [1] No Known Allergies

## 2024-08-17 NOTE — Assessment & Plan Note (Signed)
 Uncontrolled diabetes with hemoglobin A1c at 8.6 Recommend to restart Synjardy  Continue rosuvastatin  10 mg daily Diet and nutrition discussed Vascular risk associated with uncontrolled diabetes discussed
